# Patient Record
Sex: Male | Born: 1935 | Race: White | Hispanic: No | Marital: Married | Smoking: Never smoker
Health system: Southern US, Community
[De-identification: ages and names within clinical notes are randomized; demographics above are authoritative.]

## PROBLEM LIST (undated history)

## (undated) DIAGNOSIS — H409 Unspecified glaucoma: Secondary | ICD-10-CM

## (undated) DIAGNOSIS — F0390 Unspecified dementia without behavioral disturbance: Secondary | ICD-10-CM

## (undated) DIAGNOSIS — F32A Depression, unspecified: Secondary | ICD-10-CM

## (undated) DIAGNOSIS — N289 Disorder of kidney and ureter, unspecified: Secondary | ICD-10-CM

## (undated) HISTORY — DX: Unspecified glaucoma: H40.9

---

## 2015-09-15 DIAGNOSIS — H26491 Other secondary cataract, right eye: Secondary | ICD-10-CM | POA: Diagnosis not present

## 2015-09-15 DIAGNOSIS — H02403 Unspecified ptosis of bilateral eyelids: Secondary | ICD-10-CM | POA: Diagnosis not present

## 2015-09-15 DIAGNOSIS — Z961 Presence of intraocular lens: Secondary | ICD-10-CM | POA: Diagnosis not present

## 2015-09-15 DIAGNOSIS — H4010X2 Unspecified open-angle glaucoma, moderate stage: Secondary | ICD-10-CM | POA: Diagnosis not present

## 2015-12-18 ENCOUNTER — Encounter (HOSPITAL_COMMUNITY): Payer: Self-pay | Admitting: Emergency Medicine

## 2015-12-18 ENCOUNTER — Emergency Department (HOSPITAL_COMMUNITY)
Admission: EM | Admit: 2015-12-18 | Discharge: 2015-12-18 | Disposition: A | Discharge status: Home / Self Care | Payer: PPO | Attending: Emergency Medicine | Admitting: Emergency Medicine

## 2015-12-18 DIAGNOSIS — R32 Unspecified urinary incontinence: Secondary | ICD-10-CM | POA: Insufficient documentation

## 2015-12-18 DIAGNOSIS — Z87448 Personal history of other diseases of urinary system: Secondary | ICD-10-CM | POA: Diagnosis not present

## 2015-12-18 DIAGNOSIS — R319 Hematuria, unspecified: Secondary | ICD-10-CM | POA: Diagnosis not present

## 2015-12-18 DIAGNOSIS — R531 Weakness: Secondary | ICD-10-CM | POA: Diagnosis not present

## 2015-12-18 DIAGNOSIS — T887XXA Unspecified adverse effect of drug or medicament, initial encounter: Secondary | ICD-10-CM | POA: Diagnosis not present

## 2015-12-18 HISTORY — DX: Disorder of kidney and ureter, unspecified: N28.9

## 2015-12-18 LAB — COMPREHENSIVE METABOLIC PANEL
ALK PHOS: 64 U/L (ref 38–126)
ALT: 20 U/L (ref 17–63)
ANION GAP: 11 (ref 5–15)
AST: 19 U/L (ref 15–41)
Albumin: 4.5 g/dL (ref 3.5–5.0)
BUN: 19 mg/dL (ref 6–20)
CALCIUM: 9.2 mg/dL (ref 8.9–10.3)
CHLORIDE: 99 mmol/L — AB (ref 101–111)
CO2: 23 mmol/L (ref 22–32)
Creatinine, Ser: 1.13 mg/dL (ref 0.61–1.24)
GFR calc non Af Amer: 60 mL/min — ABNORMAL LOW (ref >60)
Glucose, Bld: 109 mg/dL — ABNORMAL HIGH (ref 65–99)
POTASSIUM: 4 mmol/L (ref 3.5–5.1)
SODIUM: 133 mmol/L — AB (ref 135–145)
Total Bilirubin: 1.5 mg/dL — ABNORMAL HIGH (ref 0.3–1.2)
Total Protein: 7.7 g/dL (ref 6.5–8.1)

## 2015-12-18 LAB — CBC WITH DIFFERENTIAL/PLATELET
Basophils Absolute: 0 10*3/uL (ref 0.0–0.1)
Basophils Relative: 0 %
EOS ABS: 0 10*3/uL (ref 0.0–0.7)
EOS PCT: 1 %
HCT: 46.2 % (ref 39.0–52.0)
Hemoglobin: 16 g/dL (ref 13.0–17.0)
LYMPHS ABS: 1.5 10*3/uL (ref 0.7–4.0)
Lymphocytes Relative: 21 %
MCH: 31.6 pg (ref 26.0–34.0)
MCHC: 34.6 g/dL (ref 30.0–36.0)
MCV: 91.3 fL (ref 78.0–100.0)
MONO ABS: 0.8 10*3/uL (ref 0.1–1.0)
MONOS PCT: 11 %
Neutro Abs: 4.8 10*3/uL (ref 1.7–7.7)
Neutrophils Relative %: 67 %
PLATELETS: 187 10*3/uL (ref 150–400)
RBC: 5.06 MIL/uL (ref 4.22–5.81)
RDW: 12.4 % (ref 11.5–15.5)
WBC: 7.2 10*3/uL (ref 4.0–10.5)

## 2015-12-18 LAB — URINALYSIS, ROUTINE W REFLEX MICROSCOPIC
BILIRUBIN URINE: NEGATIVE
Glucose, UA: NEGATIVE mg/dL
HGB URINE DIPSTICK: NEGATIVE
KETONES UR: 15 mg/dL — AB
Leukocytes, UA: NEGATIVE
Nitrite: NEGATIVE
PH: 5 (ref 5.0–8.0)
Protein, ur: NEGATIVE mg/dL
SPECIFIC GRAVITY, URINE: 1.02 (ref 1.005–1.030)

## 2015-12-18 MED ORDER — SODIUM CHLORIDE 0.9 % IV BOLUS (SEPSIS)
1000.0000 mL | Freq: Once | INTRAVENOUS | Status: AC
  Administered 2015-12-18: 1000 mL via INTRAVENOUS

## 2015-12-18 NOTE — ED Notes (Signed)
Per wife, hematuria for a couple of days

## 2015-12-18 NOTE — Progress Notes (Signed)
Patient listed as having  [employer] insurance without a pcp.  Patient's wife at bedside reports she has the same insurance and is seen by Dr. Marden Nobleobert Gates of [facility] and will cantact the office to see if patient will be accepted there as a patient.

## 2015-12-18 NOTE — Discharge Instructions (Signed)
Your bloodwork today was normal. Your urine showed a tiny bit of dehydration. You were given IV fluids in the emergency room today. Please call [facility] to schedule a follow up evaluation of your urinary issues. Return to the emergency room for worsening condition or new concerning symptoms. Follow up with your regular doctor as well. If you don't have a regular doctor use one of the numbers below to establish a primary care doctor.   Emergency Department Resource Guide 1) Find a Doctor and Pay Out of Pocket Although you won't have to find out who is covered by your insurance plan, it is a good idea to ask around and get recommendations. You will then need to call the office and see if the doctor you have chosen will accept you as a new patient and what types of options they offer for patients who are self-pay. Some doctors offer discounts or will set up payment plans for their patients who do not have insurance, but you will need to ask so you aren't surprised when you get to your appointment.  2) Contact Your Local Health Department Not all health departments have doctors that can see patients for sick visits, but many do, so it is worth a call to see if yours does. If you don't know where your local health department is, you can check in your phone book. The CDC also has a tool to help you locate your state's health department, and many state websites also have listings of all of their local health departments.  3) Find a Walk-in Clinic If your illness is not likely to be very severe or complicated, you may want to try a walk in clinic. These are popping up all over the country in pharmacies, drugstores, and shopping centers. They're usually staffed by nurse practitioners or physician assistants that have been trained to treat common illnesses and complaints. They're usually fairly quick and inexpensive. However, if you have serious medical issues or chronic medical problems, these are probably  not your best option.  No Primary Care Doctor: - Call Health Connect at  [phone] - they can help you locate a primary care doctor that  accepts your insurance, provides certain services, etc. - Physician Referral [phone]  Emergency Department Resource Guide 1) Find a Doctor and Pay Out of Pocket Although you won't have to find out who is covered by your insurance plan, it is a good idea to ask around and get recommendations. You will then need to call the office and see if the doctor you have chosen will accept you as a new patient and what types of options they offer for patients who are self-pay. Some doctors offer discounts or will set up payment plans for their patients who do not have insurance, but you will need to ask so you aren't surprised when you get to your appointment.  2) Contact Your Local Health Department Not all health departments have doctors that can see patients for sick visits, but many do, so it is worth a call to see if yours does. If you don't know where your local health department is, you can check in your phone book. The CDC also has a tool to help you locate your state's health department, and many state websites also have listings of all of their local health departments.  3) Find a Walk-in Clinic If your illness is not likely to be very severe or complicated, you may want to try a walk in clinic. These  are popping up all over the country in pharmacies, drugstores, and shopping centers. They're usually staffed by nurse practitioners or physician assistants that have been trained to treat common illnesses and complaints. They're usually fairly quick and inexpensive. However, if you have serious medical issues or chronic medical problems, these are probably not your best option.  No Primary Care Doctor: - Call Health Connect at  [phone] - they can help you locate a primary care doctor that  accepts your insurance, provides certain services,  etc. - Physician Referral Service- [phone]  Chronic Pain Problems: Organization         Address  Phone   Notes  [facility]  [phone] Patients need to be referred by their primary care doctor.   Medication Assistance: Organization         Address  Phone   Notes  [facility] Medication [facility] [address] [place], [place] [address] [phone] --Must be a resident of [facility] -- Must have NO insurance coverage whatsoever (no Medicaid/ Medicare, etc.) -- The pt. MUST have a primary care doctor that directs their care regularly and follows them in the community   MedAssist  [phone]   [employer]  [phone]    Agencies that provide inexpensive medical care: Organization         Address  Phone   Notes  [facility]  [phone]   [facility]    [phone]   [facility] [address] [place], [place] [address] [phone]   [facility] [address], [place] [phone]   [facility]    [phone]) [phone]   [facility]    [phone]   [facility]  [address], [place] Phone:  [phone], Fax:  [phone] Hours of Operation:  9 am - 6 pm, M-F.  Also accepts Medicaid/Medicare and self-pay.  [facility]  [address], [place] Phone: [phone], Fax: [phone]. Hours of Operation:  8:30 am - 5:30 pm, M-F.  Also accepts Medicaid and self-pay.  [facility] [place] [address], [place] Phone: [phone]) [phone]   [facility] [address], [place], [place] [phone], Ext. [phone] Mondays & Thursdays: 7-9 AM.  First 15 patients are seen on a first come, first serve basis.    Medicaid-accepting [facility] Providers:  Organization         Address  Phone   Notes  [facility] [address], [place] [phone] Also accepts self-pay patients.  [facility] [address], [place]  [phone]) [phone]   [facility] [address], [place] [phone]   [facility] [address] [place] [address], [place] [phone]) [phone]   [facility]akers[facility] [place] [address], [place]   [phone] Only accepts [place] patients after they have their name applied to their card.   Self-Pay (no insurance) in [place]:  Organization         Address  Phone   Notes  Sickle Cell Patients, [facility] [address], [place] [phone]) [phone]   [facility] [address], [place] [phone]   [facility] [place]  [place], [address],  [phone]   [facility]/Dr. Osei-Bonsu  [address] [place] or [address], [place] [phone] Phone number for both [place] and [place] locations is the same.  [facility] [address], [place] [phone]   [facility] [address], [place] or [address] [phone]   [facility] [address] [place] [phone], phone; [phone], fax Sees patients 1st and 3rd Saturday of every month.  Must not qualify for public or private insurance (i.e. Medicaid, Medicare, [place] Choice, Veterans' Benefits)  Household income should be no more than 200% of the poverty level The clinic cannot treat you if you are pregnant or think you are pregnant  Sexually transmitted diseases are not treated at the clinic.

## 2015-12-18 NOTE — ED Provider Notes (Signed)
CSN: [id number]     Arrival date & time 12/18/15  1629 History   First MD Initiated Contact with Patient 12/18/15 1721     Chief Complaint  Patient presents with  . Hematuria    HPI   Chad Holden is an 82 y.o. male with no significant PMH who presents to the ED for evaluation of urinary incontinence and possible hematuria. The history is provided by the patient and his wife. Pt reports the had a wisdom tooth extracted about one week ago. He states that he accidentally had been taking the prescribed norco 2 pills every 4 hours instead of as needed. Last Saturday his wife reports that he was acting very "out of it" and so she called EMS who instructed them on proper medication dosing. He states he finished the prescribed norco and amoxicillin earlier this week. He states that for the past couple of days he has noticed some urinary incontinence, particularly when up walking around. He and his wife note that his urine has also had a red tinge to it that they think looks like blood. He had OMFS f/u today and the oral surgeon told him the surgical site was healing well. Pt states he was also seen at [facility] earlier today and CBC showed some hemoconcentration, and they were told to come to the ED for further evaluation and possible IV fluids for dehydration. At this time pt has no complaints. Denies abdominal pain, n/v/d, headache, confusion, chest pain, SOB. Denies dysuria. His wife states that she thinks pt seems "slower than usual" but otherwise not acting weirdly. Pt denies smoking, EtOH, or other drug use. He does not know if he has any medical issues because he "never goes to the doctor."  Past Medical History  Diagnosis Date  . Renal disorder    History reviewed. No pertinent past surgical history. No family history on file. Social History  Substance Use Topics  . Smoking status: Never Smoker   . Smokeless tobacco: None  . Alcohol Use: No    Review of Systems  All other systems reviewed and  are negative.     Allergies  Review of patient's allergies indicates not on file.  Home Medications   Prior to Admission medications   Not on File   BP 138/93 mmHg  Pulse 87  Temp(Src) 97.8 F (36.6 C) (Oral)  Resp 18  SpO2 95% Physical Exam  Constitutional: He is oriented to person, place, and time.  HENT:  Right Ear: External ear normal.  Left Ear: External ear normal.  Nose: Nose normal.  Mouth/Throat: Oropharynx is clear and moist. No oropharyngeal exudate.  Eyes: Conjunctivae and EOM are normal. Pupils are equal, round, and reactive to light.  Neck: Normal range of motion. Neck supple.  Cardiovascular: Normal rate, regular rhythm, normal heart sounds and intact distal pulses.   Pulmonary/Chest: Effort normal and breath sounds normal. No respiratory distress. He has no wheezes. He exhibits no tenderness.  Abdominal: Soft. Bowel sounds are normal. He exhibits no distension. There is no tenderness. There is no rebound and no guarding.  Genitourinary:  Declined DRE  Musculoskeletal: He exhibits no edema.  Neurological: He is alert and oriented to person, place, and time. No cranial nerve deficit. Coordination normal.  Normal finger to nose. No pronator drift. Steady but slow gait.  Skin: Skin is warm and dry.  Psychiatric: He has a normal mood and affect.  Nursing note and vitals reviewed.   ED Course  Procedures (including critical  care time) Labs Review Labs Reviewed  URINALYSIS, ROUTINE W REFLEX MICROSCOPIC (NOT AT [facility]) - Abnormal; Notable for the following:    Ketones, ur 15 (*)    All other components within normal limits  COMPREHENSIVE METABOLIC PANEL - Abnormal; Notable for the following:    Sodium 133 (*)    Chloride 99 (*)    Glucose, Bld 109 (*)    Total Bilirubin 1.5 (*)    GFR calc non Af Amer 60 (*)    All other components within normal limits  CBC WITH DIFFERENTIAL/PLATELET    Imaging Review No results found. I have personally reviewed and  evaluated these images and lab results as part of my medical decision-making.   EKG Interpretation None      MDM   Final diagnoses:  Urinary incontinence, unspecified incontinence type    Bloodwork with mild hyponatremia of 133. UA with 15 ketones. 1L NS bolus given in the ED. Pt reports feeling improved. VSS and he is nontoxic appearing. No indication for further workup at this time. Instructed to f/u with urology regarding urinary issues. Resource guide given to establish PCP. ER return precautions given.    Carlene CoriaSerena Y Fabion Gatson, PA-C 12/18/15 1954  Alvira MondayErin Schlossman, MD 12/19/15 1355

## 2015-12-24 ENCOUNTER — Inpatient Hospital Stay (HOSPITAL_COMMUNITY)
Admission: EM | Admit: 2015-12-24 | Discharge: 2015-12-27 | DRG: 689 | Disposition: A | Discharge status: Home Health | Payer: PPO | Attending: Internal Medicine | Admitting: Internal Medicine

## 2015-12-24 ENCOUNTER — Inpatient Hospital Stay (HOSPITAL_COMMUNITY): Payer: PPO

## 2015-12-24 ENCOUNTER — Encounter (HOSPITAL_COMMUNITY): Payer: Self-pay | Admitting: Emergency Medicine

## 2015-12-24 ENCOUNTER — Emergency Department (HOSPITAL_COMMUNITY): Payer: PPO

## 2015-12-24 DIAGNOSIS — G9340 Encephalopathy, unspecified: Secondary | ICD-10-CM

## 2015-12-24 DIAGNOSIS — N281 Cyst of kidney, acquired: Secondary | ICD-10-CM | POA: Diagnosis not present

## 2015-12-24 DIAGNOSIS — N3289 Other specified disorders of bladder: Secondary | ICD-10-CM

## 2015-12-24 DIAGNOSIS — E871 Hypo-osmolality and hyponatremia: Secondary | ICD-10-CM | POA: Diagnosis present

## 2015-12-24 DIAGNOSIS — N329 Bladder disorder, unspecified: Secondary | ICD-10-CM

## 2015-12-24 DIAGNOSIS — Z79899 Other long term (current) drug therapy: Secondary | ICD-10-CM | POA: Diagnosis not present

## 2015-12-24 DIAGNOSIS — K4090 Unilateral inguinal hernia, without obstruction or gangrene, not specified as recurrent: Secondary | ICD-10-CM | POA: Diagnosis present

## 2015-12-24 DIAGNOSIS — N390 Urinary tract infection, site not specified: Secondary | ICD-10-CM | POA: Diagnosis not present

## 2015-12-24 DIAGNOSIS — Z8249 Family history of ischemic heart disease and other diseases of the circulatory system: Secondary | ICD-10-CM | POA: Diagnosis not present

## 2015-12-24 DIAGNOSIS — R410 Disorientation, unspecified: Secondary | ICD-10-CM | POA: Diagnosis present

## 2015-12-24 DIAGNOSIS — R339 Retention of urine, unspecified: Secondary | ICD-10-CM

## 2015-12-24 DIAGNOSIS — B961 Klebsiella pneumoniae [K. pneumoniae] as the cause of diseases classified elsewhere: Secondary | ICD-10-CM | POA: Diagnosis not present

## 2015-12-24 DIAGNOSIS — R319 Hematuria, unspecified: Secondary | ICD-10-CM | POA: Diagnosis present

## 2015-12-24 DIAGNOSIS — H409 Unspecified glaucoma: Secondary | ICD-10-CM | POA: Diagnosis not present

## 2015-12-24 DIAGNOSIS — Z808 Family history of malignant neoplasm of other organs or systems: Secondary | ICD-10-CM | POA: Diagnosis not present

## 2015-12-24 DIAGNOSIS — D72829 Elevated white blood cell count, unspecified: Secondary | ICD-10-CM | POA: Diagnosis not present

## 2015-12-24 LAB — COMPREHENSIVE METABOLIC PANEL
ALBUMIN: 4 g/dL (ref 3.5–5.0)
ALK PHOS: 65 U/L (ref 38–126)
ALT: 18 U/L (ref 17–63)
AST: 15 U/L (ref 15–41)
Anion gap: 10 (ref 5–15)
BUN: 14 mg/dL (ref 6–20)
CHLORIDE: 99 mmol/L — AB (ref 101–111)
CO2: 25 mmol/L (ref 22–32)
CREATININE: 1.08 mg/dL (ref 0.61–1.24)
Calcium: 9.1 mg/dL (ref 8.9–10.3)
GFR calc Af Amer: >60 mL/min (ref >60)
GFR calc non Af Amer: >60 mL/min (ref >60)
Glucose, Bld: 147 mg/dL — ABNORMAL HIGH (ref 65–99)
POTASSIUM: 4.2 mmol/L (ref 3.5–5.1)
SODIUM: 134 mmol/L — AB (ref 135–145)
TOTAL PROTEIN: 7.3 g/dL (ref 6.5–8.1)
Total Bilirubin: 1.4 mg/dL — ABNORMAL HIGH (ref 0.3–1.2)

## 2015-12-24 LAB — CBC WITH DIFFERENTIAL/PLATELET
BASOS ABS: 0 10*3/uL (ref 0.0–0.1)
Basophils Relative: 0 %
EOS ABS: 0 10*3/uL (ref 0.0–0.7)
EOS PCT: 0 %
HCT: 44.2 % (ref 39.0–52.0)
HEMOGLOBIN: 15.4 g/dL (ref 13.0–17.0)
LYMPHS ABS: 0.7 10*3/uL (ref 0.7–4.0)
LYMPHS PCT: 4 %
MCH: 32.5 pg (ref 26.0–34.0)
MCHC: 34.8 g/dL (ref 30.0–36.0)
MCV: 93.2 fL (ref 78.0–100.0)
Monocytes Absolute: 1.5 10*3/uL — ABNORMAL HIGH (ref 0.1–1.0)
Monocytes Relative: 9 %
NEUTROS PCT: 87 %
Neutro Abs: 15.2 10*3/uL — ABNORMAL HIGH (ref 1.7–7.7)
PLATELETS: 189 10*3/uL (ref 150–400)
RBC: 4.74 MIL/uL (ref 4.22–5.81)
RDW: 12.7 % (ref 11.5–15.5)
WBC: 17.5 10*3/uL — AB (ref 4.0–10.5)

## 2015-12-24 LAB — URINALYSIS, ROUTINE W REFLEX MICROSCOPIC
BILIRUBIN URINE: NEGATIVE
GLUCOSE, UA: NEGATIVE mg/dL
Ketones, ur: NEGATIVE mg/dL
NITRITE: NEGATIVE
PH: 6 (ref 5.0–8.0)
Protein, ur: 100 mg/dL — AB
SPECIFIC GRAVITY, URINE: 1.023 (ref 1.005–1.030)

## 2015-12-24 LAB — URINE MICROSCOPIC-ADD ON

## 2015-12-24 LAB — I-STAT CG4 LACTIC ACID, ED: LACTIC ACID, VENOUS: 1.59 mmol/L (ref 0.5–2.0)

## 2015-12-24 MED ORDER — IOPAMIDOL (ISOVUE-300) INJECTION 61%
100.0000 mL | Freq: Once | INTRAVENOUS | Status: AC | PRN
  Administered 2015-12-24: 100 mL via INTRAVENOUS

## 2015-12-24 MED ORDER — ENOXAPARIN SODIUM 40 MG/0.4ML ~~LOC~~ SOLN
40.0000 mg | Freq: Every day | SUBCUTANEOUS | Status: DC
  Administered 2015-12-24 – 2015-12-26 (×3): 40 mg via SUBCUTANEOUS
  Filled 2015-12-24 (×3): qty 0.4

## 2015-12-24 MED ORDER — SODIUM CHLORIDE 0.9 % IV BOLUS (SEPSIS)
1000.0000 mL | Freq: Once | INTRAVENOUS | Status: AC
  Administered 2015-12-24: 1000 mL via INTRAVENOUS

## 2015-12-24 MED ORDER — DORZOLAMIDE HCL-TIMOLOL MAL 2-0.5 % OP SOLN
1.0000 [drp] | Freq: Two times a day (BID) | OPHTHALMIC | Status: DC
  Administered 2015-12-24 – 2015-12-27 (×6): 1 [drp] via OPHTHALMIC
  Filled 2015-12-24: qty 10

## 2015-12-24 MED ORDER — SODIUM CHLORIDE 0.9 % IV SOLN
INTRAVENOUS | Status: DC
  Administered 2015-12-25 – 2015-12-26 (×3): via INTRAVENOUS

## 2015-12-24 MED ORDER — DOCUSATE SODIUM 100 MG PO CAPS
100.0000 mg | ORAL_CAPSULE | Freq: Two times a day (BID) | ORAL | Status: DC
  Administered 2015-12-25 – 2015-12-27 (×5): 100 mg via ORAL
  Filled 2015-12-24 (×6): qty 1

## 2015-12-24 MED ORDER — ACETAMINOPHEN 325 MG PO TABS
650.0000 mg | ORAL_TABLET | Freq: Four times a day (QID) | ORAL | Status: DC | PRN
  Administered 2015-12-27: 650 mg via ORAL
  Filled 2015-12-24: qty 2

## 2015-12-24 MED ORDER — TAMSULOSIN HCL 0.4 MG PO CAPS
0.4000 mg | ORAL_CAPSULE | Freq: Once | ORAL | Status: AC
  Administered 2015-12-24: 0.4 mg via ORAL
  Filled 2015-12-24: qty 1

## 2015-12-24 MED ORDER — ACETAMINOPHEN 650 MG RE SUPP: 650.0000 mg | Freq: Four times a day (QID) | RECTAL | Status: DC | PRN

## 2015-12-24 MED ORDER — SODIUM CHLORIDE 0.9% FLUSH
3.0000 mL | Freq: Two times a day (BID) | INTRAVENOUS | Status: DC
  Administered 2015-12-24 – 2015-12-25 (×3): 3 mL via INTRAVENOUS

## 2015-12-24 MED ORDER — PHENAZOPYRIDINE HCL 100 MG PO TABS
100.0000 mg | ORAL_TABLET | Freq: Three times a day (TID) | ORAL | Status: DC | PRN
  Administered 2015-12-24 – 2015-12-27 (×2): 100 mg via ORAL
  Filled 2015-12-24 (×5): qty 1

## 2015-12-24 MED ORDER — LATANOPROST 0.005 % OP SOLN
1.0000 [drp] | Freq: Every day | OPHTHALMIC | Status: DC
  Administered 2015-12-24 – 2015-12-26 (×3): 1 [drp] via OPHTHALMIC
  Filled 2015-12-24: qty 2.5

## 2015-12-24 MED ORDER — DEXTROSE 5 % IV SOLN
1.0000 g | INTRAVENOUS | Status: DC
  Administered 2015-12-25 – 2015-12-27 (×3): 1 g via INTRAVENOUS
  Filled 2015-12-24 (×3): qty 10

## 2015-12-24 MED ORDER — TAMSULOSIN HCL 0.4 MG PO CAPS
0.4000 mg | ORAL_CAPSULE | Freq: Every day | ORAL | Status: DC
  Administered 2015-12-25 – 2015-12-27 (×3): 0.4 mg via ORAL
  Filled 2015-12-24 (×3): qty 1

## 2015-12-24 MED ORDER — DEXTROSE 5 % IV SOLN
1.0000 g | Freq: Once | INTRAVENOUS | Status: AC
  Administered 2015-12-24: 1 g via INTRAVENOUS
  Filled 2015-12-24: qty 10

## 2015-12-24 MED ORDER — SODIUM CHLORIDE 0.9 % IV SOLN
INTRAVENOUS | Status: DC
  Administered 2015-12-24 – 2015-12-25 (×2): via INTRAVENOUS

## 2015-12-24 NOTE — ED Notes (Signed)
MD at bedside. 

## 2015-12-24 NOTE — ED Notes (Signed)
Has been unable to urinate since yesterday. Hx of urinary problems, will not answer when asked multiple times about prostate problems.

## 2015-12-24 NOTE — ED Notes (Signed)
Family at bedside. 

## 2015-12-24 NOTE — ED Notes (Signed)
Drinking oral contrast 

## 2015-12-24 NOTE — ED Notes (Signed)
Patient bladder scanned with a total of 32 cc of urine located.

## 2015-12-24 NOTE — ED Notes (Signed)
MD at bedside. EDP PRESENT SPEAKING WITH WIFE AND PT. AWARE OF CONCERNS ABOUT FOLEY. CT SCAN REVIEWED. FOLEY IS IN PLACE. tHIS DISCUSSED WITH FAMILY. PT CLEANED AND BED CHANGED. PAIN MEDS DISCUSSED

## 2015-12-24 NOTE — ED Provider Notes (Signed)
CSN: [id number]     Arrival date & time 12/24/15  1253 History   First MD Initiated Contact with Patient 12/24/15 1330     Chief Complaint  Patient presents with  . Urinary Retention     The history is provided by the patient and the spouse. No language interpreter was used.   Overton MamBobby W Kempton is a 82 y.o. male who presents to the Emergency Department complaining of difficulty urinating.  History is provided by the patient and his wife. His wife reports that he's had difficulty urinating since about 7:30 last night. He woke up multiple times throughout the night and attempted to urinate and was only able to pass small amounts of urine at any given time. No reports of fever, vomiting, bowel pain, back pain. She states that he is not been acting like himself and has been less talkative and generally weak lately. He has had intermittent issues with this since having a dental extraction on March 30. He was prescribed hydrocodone at that time but is no longer taking this medication. He does not see a doctor and has no known medical issues other than glaucoma.  Past Medical History  Diagnosis Date  . Renal disorder    History reviewed. No pertinent past surgical history. History reviewed. No pertinent family history. Social History  Substance Use Topics  . Smoking status: Never Smoker   . Smokeless tobacco: None  . Alcohol Use: No    Review of Systems  All other systems reviewed and are negative.     Allergies  Review of patient's allergies indicates no known allergies.  Home Medications   Prior to Admission medications   Medication Sig Start Date End Date Taking? Authorizing Provider  dorzolamide-timolol (COSOPT) 22.3-6.8 MG/ML ophthalmic solution PLACE 1 DROP IN [facility] EVERY 12 HOURS 10/23/15  Yes Historical Provider, MD  ibuprofen (ADVIL,MOTRIN) 200 MG tablet Take 200 mg by mouth every 6 (six) hours as needed for moderate pain.   Yes Historical Provider, MD  latanoprost (XALATAN)  0.005 % ophthalmic solution Place 1 drop into both eyes at bedtime.  12/18/15  Yes Historical Provider, MD   BP 130/85 mmHg  Pulse 84  Temp(Src) 99.3 F (37.4 C) (Oral)  Resp 17  SpO2 96% Physical Exam  Constitutional: He appears well-developed and well-nourished.  HENT:  Head: Normocephalic and atraumatic.  Cardiovascular: Normal rate and regular rhythm.   No murmur heard. Pulmonary/Chest: Effort normal and breath sounds normal. No respiratory distress.  Abdominal: Soft. There is no rebound and no guarding.  Mild lower abdominal tenderness  Musculoskeletal: He exhibits no edema or tenderness.  Neurological: He is alert.  Mildly confused and slow to answer questions. He is disoriented to time but is aware of the current president.  Skin: Skin is warm and dry.  Psychiatric: He has a normal mood and affect. His behavior is normal.  Nursing note and vitals reviewed.   ED Course  Procedures (including critical care time) Labs Review Labs Reviewed  COMPREHENSIVE METABOLIC PANEL - Abnormal; Notable for the following:    Sodium 134 (*)    Chloride 99 (*)    Glucose, Bld 147 (*)    Total Bilirubin 1.4 (*)    All other components within normal limits  CBC WITH DIFFERENTIAL/PLATELET - Abnormal; Notable for the following:    WBC 17.5 (*)    Neutro Abs 15.2 (*)    Monocytes Absolute 1.5 (*)    All other components within normal limits  URINE  CULTURE  CULTURE, BLOOD (ROUTINE X 2)  CULTURE, BLOOD (ROUTINE X 2)  URINALYSIS, ROUTINE W REFLEX MICROSCOPIC (NOT AT [facility])  I-STAT CG4 LACTIC ACID, ED    Imaging Review No results found. I have personally reviewed and evaluated these images and lab results as part of my medical decision-making.   EKG Interpretation None      MDM   Final diagnoses:  None    Patient here for evaluation of confusion, decreased urine output since yesterday. He has mild lower abdominal tenderness on examination, no significant urinary retention. Given  his confusion, leukocytosis and treat for potential urinary tract infection pending his UA results. Will check CT abdomen to rule out appendicitis given his mild lower abdominal tenderness. Patient care transferred  pending UA, CT scan.  Tilden Fossa, MD 12/24/15 1620

## 2015-12-24 NOTE — ED Notes (Signed)
Notified MD of patient confusion, slightly elevated temp, and inability to void. MD stated to get a rectal temp and not to cath until he has been medically evaluated to determine need for foley. Notified primary nurse

## 2015-12-24 NOTE — ED Notes (Signed)
Patient transported to CT 

## 2015-12-24 NOTE — ED Notes (Signed)
Bed: [id number] Expected date:  Expected time:  Means of arrival:  Comments: TR 2 when clean

## 2015-12-24 NOTE — H&P (Signed)
[facility] History and Physical  Chad MamBobby W Swor NWG:[id number] DOB: 02/15/1934 DOA: 12/24/2015  Referring physician: Dr Amalia GreenhousePhyfer PCP: No primary care provider on file.   Chief Complaint: Confusion   HPI: Chad Holden is a 82 y.o. male with no significant PMH,he doesn't like to follow  with Doctor per wife report. Patient April 1 had tooth extraction. He was prescribe antibiotics and opioids. Since that time he has been confuse, less interactive. He presents today to the ED with difficulty urinating, pain, and worsening confusion. He   He denies chest pain, dyspnea,nausea or vomiting. He had BM yesterday. He has been having, bladder spasm since foley was place.   Evaluation in the ED; UA with too numerous WBC, WBC at 17. CT abdomen: 1. No obstructive uropathy or urologic abnormality. The bladder is completely decompressed by Foley catheter at this time. Small bowel and mesentery containing but non incarcerated appearing right inguinal hernia tracking into the scrotum.   Review of Systems:  Negative, except as per HPI   Past Medical History  Diagnosis Date  . Renal disorder    History reviewed. No pertinent past surgical history. Social History:  reports that he has never smoked. He does not have any smokeless tobacco history on file. He reports that he does not drink alcohol. His drug history is not on file.  No Known Allergies  Family History:  Mother died CHF, Father throat cancer.    Prior to Admission medications   Medication Sig Start Date End Date Taking? Authorizing Provider  dorzolamide-timolol (COSOPT) 22.3-6.8 MG/ML ophthalmic solution PLACE [facility] 10/23/15  Yes Historical Provider, MD  ibuprofen (ADVIL,MOTRIN) 200 MG tablet Take 200 mg by mouth every 6 (six) hours as needed for moderate pain.   Yes Historical Provider, MD  latanoprost (XALATAN) 0.005 % ophthalmic solution Place 1 drop into both eyes at bedtime.  12/18/15  Yes Historical  Provider, MD   Physical Exam: Filed Vitals:   12/24/15 1257 12/24/15 1530 12/24/15 1600 12/24/15 1754  BP: 124/75 130/69 130/85 135/82  Pulse: 100 84  83  Temp: 99.3 F (37.4 C)   98.1 F (36.7 C)  TempSrc: Oral   Oral  Resp: 19 21 17 20   SpO2: 97% 96%  99%    Wt Readings from Last 3 Encounters:  No data found for Wt    General:  Appears calm and comfortable Eyes: PERRL, normal lids, irises & conjunctiva ENT: grossly normal hearing, lips & tongue Neck: no LAD, masses or thyromegaly Cardiovascular: RRR, no m/r/g. No LE edema. Telemetry: SR, no arrhythmias  Respiratory: CTA bilaterally, no w/r/r. Normal respiratory effort. Abdomen: soft, ntnd Skin: no rash or induration seen on limited exam Musculoskeletal: grossly normal tone BUE/BLE Neurologic: grossly non-focal. Slowly answering questions. Follow commands. Indifferent           Labs on Admission:  Basic Metabolic Panel:  Recent Labs Lab 12/18/15 1819 12/24/15 1418  NA 133* 134*  K 4.0 4.2  CL 99* 99*  CO2 23 25  GLUCOSE 109* 147*  BUN 19 14  CREATININE 1.13 1.08  CALCIUM 9.2 9.1   Liver Function Tests:  Recent Labs Lab 12/18/15 1819 12/24/15 1418  AST 19 15  ALT 20 18  ALKPHOS 64 65  BILITOT 1.5* 1.4*  PROT 7.7 7.3  ALBUMIN 4.5 4.0   No results for input(s): LIPASE, AMYLASE in the last 168 hours. No results for input(s): AMMONIA in the last 168 hours. CBC:  Recent Labs Lab 12/18/15 1819 12/24/15 1418  WBC 7.2 17.5*  NEUTROABS 4.8 15.2*  HGB 16.0 15.4  HCT 46.2 44.2  MCV 91.3 93.2  PLT 187 189   Cardiac Enzymes: No results for input(s): CKTOTAL, CKMB, CKMBINDEX, TROPONINI in the last 168 hours.  BNP (last 3 results) No results for input(s): BNP in the last 8760 hours.  ProBNP (last 3 results) No results for input(s): PROBNP in the last 8760 hours.  CBG: No results for input(s): GLUCAP in the last 168 hours.  Radiological Exams on Admission: Ct Abdomen Pelvis W  Contrast  12/24/2015  CLINICAL DATA:  82 year old male with acute difficulty urinating. Confusion. Initial encounter. EXAM: CT ABDOMEN AND PELVIS WITH CONTRAST TECHNIQUE: Multidetector CT imaging of the abdomen and pelvis was performed using the standard protocol following bolus administration of intravenous contrast. CONTRAST:  ISOVUE-300 IOPAMIDOL (ISOVUE-300) INJECTION 61% COMPARISON:  None. FINDINGS: Mild respiratory motion artifact at the lung bases. Mild atelectasis, otherwise negative. No pericardial or pleural effusion. Multilevel degenerative lumbar spondylolisthesis with widespread facet degeneration. No acute osseous abnormality identified. No pelvic free fluid. Foley catheter in the urinary bladder which is completely decompressed. Dystrophic prostate calcifications. Negative rectum. Negative sigmoid colon. Decompressed left colon. Decompressed transverse colon. Negative right colon and appendix. Negative terminal ileum. No dilated small bowel. There is a small bowel and small bowel mesenteric containing right inguinal hernia which tracks into the scrotum. Associated loops appear normal. No abnormal small bowel loops elsewhere. Decompressed stomach and duodenum. No abdominal free air or free fluid. Negative liver with occasional tiny low-density areas most likely benign cysts. Gallbladder, spleen, pancreas and adrenal glands are normal. Portal venous system is patent. Aortoiliac calcified atherosclerosis noted. Major arterial structures are patent. Bilateral iliac arteries are ectatic. No lymphadenopathy. Bilateral renal enhancement and contrast excretion is symmetric and normal. There is a benign left renal parapelvic cyst. No hydronephrosis or definite pathologic perinephric stranding. No nephrolithiasis identified. Negative left ureter. Negative right ureter. Incidental pelvic phleboliths. IMPRESSION: 1. No obstructive uropathy or urologic abnormality. The bladder is completely decompressed by  Foley catheter at this time. 2. Small bowel and mesentery containing but non incarcerated appearing right inguinal hernia tracking into the scrotum. 3. Calcified aortic atherosclerosis. Electronically Signed   By: Odessa Fleming M.D.   On: 12/24/2015 16:54    EKG: Will order EKG  Assessment/Plan Active Problems:   UTI (lower urinary tract infection)   Acute encephalopathy   Leukocytosis  1-UTI; Patient presents with difficulty with urination, UA with too numerous to count WBC.  Continue with Ceftriaxone.  Foley place in the ED.  Need voiding trial.  Start flomax.  Lactic acid normal.   2-Leukocytosis; secondary to UTI.  Follow trend.  IV antibiotics.   3-Encephalopathy;  Patient has been less interactive, talkative. " in a Daze" per wife report. Everything started after he was taking opioid for tooth pain April first. He has not been taking pain medications anymore, but he is still not back to baseline.  It might be related to UTI also, but will rule out stroke.   4-Mild hyponatremia;  Continue with IV fluids.   5-urine retension; foley in place. Started flomax. Need voiding trial when infection improved.   Code Status: partial code DVT Prophylaxis: lovenox Family Communication: Care discussed with wife who was at bedside. Disposition Plan:admit for tx UTI  Time spent: 75 minutes.   Hartley Barefoot A [facility] Pager [phone]

## 2015-12-25 DIAGNOSIS — G9340 Encephalopathy, unspecified: Secondary | ICD-10-CM

## 2015-12-25 DIAGNOSIS — N390 Urinary tract infection, site not specified: Secondary | ICD-10-CM | POA: Diagnosis not present

## 2015-12-25 DIAGNOSIS — D72829 Elevated white blood cell count, unspecified: Secondary | ICD-10-CM | POA: Diagnosis not present

## 2015-12-25 DIAGNOSIS — R339 Retention of urine, unspecified: Secondary | ICD-10-CM

## 2015-12-25 DIAGNOSIS — R410 Disorientation, unspecified: Secondary | ICD-10-CM | POA: Diagnosis not present

## 2015-12-25 DIAGNOSIS — B961 Klebsiella pneumoniae [K. pneumoniae] as the cause of diseases classified elsewhere: Secondary | ICD-10-CM | POA: Diagnosis not present

## 2015-12-25 DIAGNOSIS — R319 Hematuria, unspecified: Secondary | ICD-10-CM | POA: Diagnosis not present

## 2015-12-25 LAB — COMPREHENSIVE METABOLIC PANEL
ALBUMIN: 3.2 g/dL — AB (ref 3.5–5.0)
ALK PHOS: 48 U/L (ref 38–126)
ALT: 15 U/L — ABNORMAL LOW (ref 17–63)
ANION GAP: 8 (ref 5–15)
AST: 13 U/L — AB (ref 15–41)
BUN: 12 mg/dL (ref 6–20)
CO2: 22 mmol/L (ref 22–32)
Calcium: 8.3 mg/dL — ABNORMAL LOW (ref 8.9–10.3)
Chloride: 106 mmol/L (ref 101–111)
Creatinine, Ser: 0.85 mg/dL (ref 0.61–1.24)
GFR calc Af Amer: >60 mL/min (ref >60)
GFR calc non Af Amer: >60 mL/min (ref >60)
GLUCOSE: 134 mg/dL — AB (ref 65–99)
POTASSIUM: 3.8 mmol/L (ref 3.5–5.1)
SODIUM: 136 mmol/L (ref 135–145)
Total Bilirubin: 0.9 mg/dL (ref 0.3–1.2)
Total Protein: 5.7 g/dL — ABNORMAL LOW (ref 6.5–8.1)

## 2015-12-25 LAB — CBC
HEMATOCRIT: 37.1 % — AB (ref 39.0–52.0)
HEMOGLOBIN: 12.7 g/dL — AB (ref 13.0–17.0)
MCH: 31.9 pg (ref 26.0–34.0)
MCHC: 34.2 g/dL (ref 30.0–36.0)
MCV: 93.2 fL (ref 78.0–100.0)
Platelets: 155 10*3/uL (ref 150–400)
RBC: 3.98 MIL/uL — ABNORMAL LOW (ref 4.22–5.81)
RDW: 12.7 % (ref 11.5–15.5)
WBC: 12.3 10*3/uL — AB (ref 4.0–10.5)

## 2015-12-25 NOTE — Evaluation (Signed)
Physical Therapy Evaluation Patient Details Name: Chad Holden MRN: [id number] DOB: 1934/01/18 Today's Date: 12/25/2015   History of Present Illness  Pt is a 82 year old male admitted with urinary tract infection with hematuria and urinary retention as well as acute encephalopathy  Clinical Impression  Pt admitted with above diagnosis. Pt currently with functional limitations due to the deficits listed below (see PT Problem List).  Pt will benefit from skilled PT to increase their independence and safety with mobility to allow discharge to the venue listed below.   Pt still a little confused today and requiring repeated verbal cues for tasks however able to ambulate in hallway.     Follow Up Recommendations Home health PT;Supervision/Assistance - 24 hour    Equipment Recommendations  Rolling walker with 5" wheels    Recommendations for Other Services       Precautions / Restrictions Precautions Precautions: Fall      Mobility  Bed Mobility Overal bed mobility: Needs Assistance Bed Mobility: Supine to Sit     Supine to sit: Min assist;HOB elevated     General bed mobility comments: increased time and effort, slight assist for trunk  Transfers Overall transfer level: Needs assistance Equipment used: None Transfers: Sit to/from Stand Sit to Stand: Min assist         General transfer comment: assist for rise and steadying  Ambulation/Gait Ambulation/Gait assistance: Min assist Ambulation Distance (Feet): 140 Feet Assistive device: 1 person hand held assist Gait Pattern/deviations: Step-through pattern;Decreased stride length     General Gait Details: requiring UE support so provided HHA, may benefit from RW if needed next visit however spouse reports he typically does not use anything at home  Stairs            Wheelchair Mobility    Modified Rankin (Stroke Patients Only)       Balance Overall balance assessment: Needs assistance          Standing balance support: Single extremity supported;During functional activity Standing balance-Leahy Scale: Poor Standing balance comment: requiring HHA for ambulation today                             Pertinent Vitals/Pain Pain Assessment: No/denies pain    Home Living Family/patient expects to be discharged to:: Private residence Living Arrangements: Spouse/significant other     Home Access: Stairs to enter   Secretary/administrator of Steps: 1 Home Layout: One level Home Equipment: None      Prior Function Level of Independence: Independent               Hand Dominance        Extremity/Trunk Assessment   Upper Extremity Assessment: Generalized weakness           Lower Extremity Assessment: Generalized weakness         Communication   Communication: No difficulties  Cognition Arousal/Alertness: Awake/alert Behavior During Therapy: Flat affect Overall Cognitive Status: Impaired/Different from baseline Area of Impairment: Orientation Orientation Level: Disoriented to;Place             General Comments: slow to answer orientation, [facility], spouse reports still "foggy" compared to baseline but improved today    General Comments      Exercises        Assessment/Plan    PT Assessment Patient needs continued PT services  PT Diagnosis Difficulty walking;Generalized weakness   PT Problem List Decreased strength;Decreased  cognition;Decreased mobility;Decreased activity tolerance;Decreased balance;Decreased safety awareness;Decreased knowledge of use of DME  PT Treatment Interventions DME instruction;Gait training;Functional mobility training;Patient/family education;Therapeutic activities;Therapeutic exercise;Balance training   PT Goals (Current goals can be found in the Care Plan section) Acute Rehab PT Goals PT Goal Formulation: With patient/family Time For Goal Achievement: 01/01/16 Potential to Achieve  Goals: Good    Frequency Min 3X/week   Barriers to discharge        Co-evaluation               End of Session Equipment Utilized During Treatment: Gait belt Activity Tolerance: Patient tolerated treatment well Patient left: in chair;with call bell/phone within reach;with chair alarm set;with family/visitor present           Time: 1410-1433 PT Time Calculation (min) (ACUTE ONLY): 23 min   Charges:   PT Evaluation $PT Eval Moderate Complexity: 1 Procedure     PT G Codes:        Ursula Dermody,KATHrine E 12/25/2015, 4:10 PM Zenovia JarredKati Charley Miske, PT, DPT 12/25/2015 Pager: [phone]

## 2015-12-25 NOTE — Plan of Care (Signed)
Problem: Pain Managment: Goal: General experience of comfort will improve Outcome: Progressing Denies pain at this time.    Problem: Skin Integrity: Goal: Risk for impaired skin integrity will decrease Continue.  Reposition every 2 hours.    Problem: Activity: Goal: Risk for activity intolerance will decrease Outcome: Progressing PT evaluation/ treatment ordered.    Problem: Education: Goal: Knowledge of treatment and prevention of UTI/Pyleonephritis will improve Outcome: Progressing Continue.    Problem: Urinary Elimination: Goal: Signs and symptoms of infection will decrease Outcome: Progressing BP 112/48 mmHg  Pulse 73  Temp(Src) 99.5 F (37.5 C) (Oral)  Resp 18  Ht 5\' 10"  (1.778 m)  Wt 89.4 kg (197 lb 1.5 oz)  BMI 28.28 kg/m2  SpO2 100% room air. WBC 12.3. On IV antibiotics.

## 2015-12-25 NOTE — Progress Notes (Signed)
[facility]                                                                              Patient Demographics  Chad Holden, is a 82 y.o. male, DOB - 05/03/1934, ZOX:[id number]  Admit date - 12/24/2015   Admitting Physician Alba Cory, MD  Outpatient Primary MD for the patient is No primary care provider on file.  LOS - 1   Chief Complaint  Patient presents with  . Urinary Retention      HPI on 12/24/2015 by Dr. Hartley Barefoot Chad Holden is a 82 y.o. male with no significant PMH,he doesn't like to follow with Doctor per wife report. Patient April 1 had tooth extraction. He was prescribe antibiotics and opioids. Since that time he has been confuse, less interactive. He presents today to the ED with difficulty urinating, pain, and worsening confusion. He   He denies chest pain, dyspnea,nausea or vomiting. He had BM yesterday. He has been having, bladder spasm since foley was place.   Evaluation in the ED; UA with too numerous WBC, WBC at 17. CT abdomen: 1. No obstructive uropathy or urologic abnormality. The bladder is completely decompressed by Foley catheter at this time. Small bowel and mesentery containing but non incarcerated appearing right inguinal hernia tracking into the scrotum.  Assessment & Plan   Urinary tract infection with hematuria and urinary retention -Foley catheter in place -UA: TNTC WBC, moderate leukocytes, many bacteria -Blood and Urine cultures pending -Continue ceftriaxone -CT abdomen and pelvis showed no obstructive uropathy or urological abnormality -continue Flomax  Acute encephalopathy -Likely secondary to urinary tract infection -MRI brain: Moderate generalized atrophy with mild periventricular and subcortical white matter disease, moderate ventricular enlargement proportionate to degree of atrophy. No other acute intracranial abnormality. -Spoke with neurology, Dr. Amada Jupiter, who felt all of these findings were chronic and likely  not leading to patient's acute encephalopathy -Continue monitor patient closely  Mild hyponatremia -Continue IV fluids and monitor BMP  Right inguinal hernia -CT abdomen showed small bowel and mesentery containing non-incarcerated appearing right inguinal hernia tracking into the scrotum -Follow up with surgery as an outpatient  Code Status: DNI  Family Communication: Wife at bedside  Disposition Plan: Admitted.   Time Spent in minutes   30 minutes  Procedures  Foley catheter placement  Consults   Neurology, via phone  DVT Prophylaxis  Lovenox  Lab Results  Component Value Date   PLT 155 12/25/2015    Medications  Scheduled Meds: . cefTRIAXone (ROCEPHIN)  IV  1 g Intravenous Q24H  . docusate sodium  100 mg Oral BID  . dorzolamide-timolol  1 drop Both Eyes BID  . enoxaparin (LOVENOX) injection  40 mg Subcutaneous QHS  . latanoprost  1 drop Both Eyes QHS  . sodium chloride flush  3 mL Intravenous Q12H  . tamsulosin  0.4 mg Oral Daily   Continuous Infusions: . sodium chloride 100 mL/hr at 12/25/15 1001  . sodium chloride     PRN Meds:.acetaminophen **OR** acetaminophen, phenazopyridine  Antibiotics    Anti-infectives    Start     Dose/Rate Route Frequency Ordered Stop   12/25/15 1000  cefTRIAXone (ROCEPHIN) 1 g  in dextrose 5 % 50 mL IVPB     1 g 100 mL/hr over 30 Minutes Intravenous Every 24 hours 12/24/15 1834     12/24/15 1530  cefTRIAXone (ROCEPHIN) 1 g in dextrose 5 % 50 mL IVPB     1 g 100 mL/hr over 30 Minutes Intravenous  Once 12/24/15 1517 12/24/15 1610     Subjective:   Chad Holden seen and examined today.  Patient pleasantly confused.  No complaints this morning.     Objective:   Filed Vitals:   12/24/15 1845 12/24/15 1911 12/24/15 2204 12/25/15 0503  BP: 139/76 135/70  112/48  Pulse: 84 109  73  Temp:  98.4 F (36.9 C)  99.5 F (37.5 C)  TempSrc: Oral Oral  Oral  Resp: 16 20  18   Height:   5\' 10"  (1.778 m)   Weight:   89.4 kg (197  lb 1.5 oz)   SpO2: 100% 99%  100%    Wt Readings from Last 3 Encounters:  12/24/15 89.4 kg (197 lb 1.5 oz)     Intake/Output Summary (Last 24 hours) at 12/25/15 1259 Last data filed at 12/25/15 1000  Gross per 24 hour  Intake 1176.67 ml  Output    950 ml  Net 226.67 ml    Exam  General: Well developed, well nourished, NAD, appears stated age  HEENT: NCAT, mucous membranes moist.   Cardiovascular: S1 S2 auscultated, no murmurs, RRR  Respiratory: Clear to auscultation bilaterally   Abdomen: Soft, nontender, nondistended, + bowel sounds  Extremities: warm dry without cyanosis clubbing or edema  Neuro: AAOx1, nonfocal.  Could not answer all questions or follows commands.  Inappropriately smiling.   Psych: Pleasantly confused  Data Review   Micro Results Recent Results (from the past 240 hour(s))  Urine culture     Status: None (Preliminary result)   Collection Time: 12/24/15  3:14 PM  Result Value Ref Range Status   Specimen Description URINE, CATHETERIZED  Final   Special Requests NONE  Final   Culture   Final    TOO YOUNG TO READ Performed at [facility]    Report Status PENDING  Incomplete    Radiology Reports Mr Brain Wo Contrast  12/24/2015  CLINICAL DATA:  Confusion.  Difficulty urinating.  Weakness. EXAM: MRI HEAD WITHOUT CONTRAST TECHNIQUE: Multiplanar, multiecho pulse sequences of the brain and surrounding structures were obtained without intravenous contrast. COMPARISON:  None. FINDINGS: The diffusion-weighted images demonstrate no evidence for acute or subacute infarction. For hemorrhage or mass lesion is present. Moderate generalized atrophy and mild to moderate periventricular white matter changes are present bilaterally. The ventricles are moderately dilated. This is likely proportionate to the degree of atrophy. Normal-pressure hydrocephalus is not excluded. The internal auditory canals are within normal limits bilaterally. Mild white matter  changes extend into the brainstem. Cerebellar atrophy is proportionate to the supratentorial atrophy. Flow is present in the major intracranial arteries. Bilateral lens replacements are present. The globes and orbits are otherwise intact. The paranasal sinuses and the mastoid air cells are clear. Asymmetric degenerative changes are noted on the left at C1-2. The skullbase is otherwise within normal limits. Midline sagittal images are unremarkable. IMPRESSION: 1. Moderate generalized atrophy with mild periventricular and subcortical white matter disease. This likely reflects the sequela of chronic microvascular ischemia. 2. Moderate ventricular enlargement is proportionate to the degree of atrophy. However, normal pressure hydrocephalus is not excluded. 3. No other acute intracranial abnormality. 4. Asymmetric left-sided degenerative changes in  the cervical spine at C1-2. Electronically Signed   By: Marin Robertshristopher  Mattern M.D.   On: 12/24/2015 21:38   Ct Abdomen Pelvis W Contrast  12/24/2015  CLINICAL DATA:  82 year old male with acute difficulty urinating. Confusion. Initial encounter. EXAM: CT ABDOMEN AND PELVIS WITH CONTRAST TECHNIQUE: Multidetector CT imaging of the abdomen and pelvis was performed using the standard protocol following bolus administration of intravenous contrast. CONTRAST:  100mL ISOVUE-300 IOPAMIDOL (ISOVUE-300) INJECTION 61% COMPARISON:  None. FINDINGS: Mild respiratory motion artifact at the lung bases. Mild atelectasis, otherwise negative. No pericardial or pleural effusion. Multilevel degenerative lumbar spondylolisthesis with widespread facet degeneration. No acute osseous abnormality identified. No pelvic free fluid. Foley catheter in the urinary bladder which is completely decompressed. Dystrophic prostate calcifications. Negative rectum. Negative sigmoid colon. Decompressed left colon. Decompressed transverse colon. Negative right colon and appendix. Negative terminal ileum. No dilated  small bowel. There is a small bowel and small bowel mesenteric containing right inguinal hernia which tracks into the scrotum. Associated loops appear normal. No abnormal small bowel loops elsewhere. Decompressed stomach and duodenum. No abdominal free air or free fluid. Negative liver with occasional tiny low-density areas most likely benign cysts. Gallbladder, spleen, pancreas and adrenal glands are normal. Portal venous system is patent. Aortoiliac calcified atherosclerosis noted. Major arterial structures are patent. Bilateral iliac arteries are ectatic. No lymphadenopathy. Bilateral renal enhancement and contrast excretion is symmetric and normal. There is a benign left renal parapelvic cyst. No hydronephrosis or definite pathologic perinephric stranding. No nephrolithiasis identified. Negative left ureter. Negative right ureter. Incidental pelvic phleboliths. IMPRESSION: 1. No obstructive uropathy or urologic abnormality. The bladder is completely decompressed by Foley catheter at this time. 2. Small bowel and mesentery containing but non incarcerated appearing right inguinal hernia tracking into the scrotum. 3. Calcified aortic atherosclerosis. Electronically Signed   By: Odessa FlemingH  Hall M.D.   On: 12/24/2015 16:54    CBC  Recent Labs Lab 12/18/15 1819 12/24/15 1418 12/25/15 0456  WBC 7.2 17.5* 12.3*  HGB 16.0 15.4 12.7*  HCT 46.2 44.2 37.1*  PLT 187 189 155  MCV 91.3 93.2 93.2  MCH 31.6 32.5 31.9  MCHC 34.6 34.8 34.2  RDW 12.4 12.7 12.7  LYMPHSABS 1.5 0.7  --   MONOABS 0.8 1.5*  --   EOSABS 0.0 0.0  --   BASOSABS 0.0 0.0  --     Chemistries   Recent Labs Lab 12/18/15 1819 12/24/15 1418 12/25/15 0456  NA 133* 134* 136  K 4.0 4.2 3.8  CL 99* 99* 106  CO2 23 25 22   GLUCOSE 109* 147* 134*  BUN 19 14 12   CREATININE 1.13 1.08 0.85  CALCIUM 9.2 9.1 8.3*  AST 19 15 13*  ALT 20 18 15*  ALKPHOS 64 65 48  BILITOT 1.5* 1.4* 0.9    ------------------------------------------------------------------------------------------------------------------ estimated creatinine clearance is 79.3 mL/min (by C-G formula based on Cr of 0.85). ------------------------------------------------------------------------------------------------------------------ No results for input(s): HGBA1C in the last 72 hours. ------------------------------------------------------------------------------------------------------------------ No results for input(s): CHOL, HDL, LDLCALC, TRIG, CHOLHDL, LDLDIRECT in the last 72 hours. ------------------------------------------------------------------------------------------------------------------ No results for input(s): TSH, T4TOTAL, T3FREE, THYROIDAB in the last 72 hours.  Invalid input(s): FREET3 ------------------------------------------------------------------------------------------------------------------ No results for input(s): VITAMINB12, FOLATE, FERRITIN, TIBC, IRON, RETICCTPCT in the last 72 hours.  Coagulation profile No results for input(s): INR, PROTIME in the last 168 hours.  No results for input(s): DDIMER in the last 72 hours.  Cardiac Enzymes No results for input(s): CKMB, TROPONINI, MYOGLOBIN in the last 168 hours.  Invalid  input(s): CK ------------------------------------------------------------------------------------------------------------------ Invalid input(s): POCBNP    Akira Adelsberger D.O. on 12/25/2015 at 12:59 PM  Between 7am to 7pm - Pager - [phone]  After 7pm go to [email or web address] - password [email or web address]  And look for the night coverage person covering for me after hours  [facility] Office  [phone]

## 2015-12-26 DIAGNOSIS — G9340 Encephalopathy, unspecified: Secondary | ICD-10-CM | POA: Diagnosis not present

## 2015-12-26 DIAGNOSIS — R410 Disorientation, unspecified: Secondary | ICD-10-CM | POA: Diagnosis not present

## 2015-12-26 DIAGNOSIS — N390 Urinary tract infection, site not specified: Secondary | ICD-10-CM | POA: Diagnosis not present

## 2015-12-26 DIAGNOSIS — D72829 Elevated white blood cell count, unspecified: Secondary | ICD-10-CM | POA: Diagnosis not present

## 2015-12-26 LAB — BASIC METABOLIC PANEL
Anion gap: 7 (ref 5–15)
BUN: 12 mg/dL (ref 6–20)
CHLORIDE: 108 mmol/L (ref 101–111)
CO2: 21 mmol/L — AB (ref 22–32)
CREATININE: 0.84 mg/dL (ref 0.61–1.24)
Calcium: 8 mg/dL — ABNORMAL LOW (ref 8.9–10.3)
GFR calc non Af Amer: >60 mL/min (ref >60)
Glucose, Bld: 130 mg/dL — ABNORMAL HIGH (ref 65–99)
POTASSIUM: 3.7 mmol/L (ref 3.5–5.1)
Sodium: 136 mmol/L (ref 135–145)

## 2015-12-26 LAB — CBC
HEMATOCRIT: 35.9 % — AB (ref 39.0–52.0)
HEMOGLOBIN: 12.6 g/dL — AB (ref 13.0–17.0)
MCH: 32.6 pg (ref 26.0–34.0)
MCHC: 35.1 g/dL (ref 30.0–36.0)
MCV: 92.8 fL (ref 78.0–100.0)
Platelets: 130 10*3/uL — ABNORMAL LOW (ref 150–400)
RBC: 3.87 MIL/uL — AB (ref 4.22–5.81)
RDW: 12.9 % (ref 11.5–15.5)
WBC: 7.8 10*3/uL (ref 4.0–10.5)

## 2015-12-26 NOTE — Progress Notes (Signed)
[facility]                                                                              Patient Demographics  Chad KirschnerBobby Consolo, is a 82 y.o. male, DOB - 1934/02/11, [id number]  Admit date - 12/24/2015   Admitting Physician Alba CoryBelkys A Regalado, MD  Outpatient Primary MD for the patient is No primary care provider on file.  LOS - 2   Chief Complaint  Patient presents with  . Urinary Retention      HPI on 12/24/2015 by Dr. Hartley BarefootBelkys Regalado Overton MamBobby W Zorn is a 82 y.o. male with no significant PMH,he doesn't like to follow with Doctor per wife report. Patient April 1 had tooth extraction. He was prescribe antibiotics and opioids. Since that time he has been confuse, less interactive. He presents today to the ED with difficulty urinating, pain, and worsening confusion. He   He denies chest pain, dyspnea,nausea or vomiting. He had BM yesterday. He has been having, bladder spasm since foley was place.   Evaluation in the ED; UA with too numerous WBC, WBC at 17. CT abdomen: 1. No obstructive uropathy or urologic abnormality. The bladder is completely decompressed by Foley catheter at this time. Small bowel and mesentery containing but non incarcerated appearing right inguinal hernia tracking into the scrotum.  Assessment & Plan   Urinary tract infection with hematuria and urinary retention -Foley catheter in place -UA: TNTC WBC, moderate leukocytes, many bacteria -Blood cultures show no growth to date -Urine culture pending -Continue ceftriaxone -CT abdomen and pelvis showed no obstructive uropathy or urological abnormality -continue Flomax -Will attempt voiding trial today  Acute encephalopathy -Improving  -Likely secondary to urinary tract infection -MRI brain: Moderate generalized atrophy with mild periventricular and subcortical white matter disease, moderate ventricular enlargement proportionate to degree of atrophy. No other acute intracranial abnormality. -Spoke with  neurology, Dr. Amada JupiterKirkpatrick, who felt all of these findings were chronic and likely not leading to patient's acute encephalopathy -Continue monitor patient closely -PT consulted, recommended HHPT  Mild hyponatremia -Resolved, Continue IV fluids and monitor BMP  Right inguinal hernia -CT abdomen showed small bowel and mesentery containing non-incarcerated appearing right inguinal hernia tracking into the scrotum -Follow up with surgery as an outpatient  Code Status: DNI  Family Communication: Wife at bedside  Disposition Plan: Admitted. Will likely discharge patient within 24-48 hours. Pending urine culture  Time Spent in minutes   30 minutes  Procedures  Foley catheter placement  Consults   Neurology, via phone  DVT Prophylaxis  Lovenox  Lab Results  Component Value Date   PLT 130* 12/26/2015    Medications  Scheduled Meds: . cefTRIAXone (ROCEPHIN)  IV  1 g Intravenous Q24H  . docusate sodium  100 mg Oral BID  . dorzolamide-timolol  1 drop Both Eyes BID  . enoxaparin (LOVENOX) injection  40 mg Subcutaneous QHS  . latanoprost  1 drop Both Eyes QHS  . sodium chloride flush  3 mL Intravenous Q12H  . tamsulosin  0.4 mg Oral Daily   Continuous Infusions: . sodium chloride 100 mL/hr at 12/25/15 1001  . sodium chloride 100 mL/hr at 12/26/15 0947   PRN Meds:.acetaminophen **OR** acetaminophen,  phenazopyridine  Antibiotics    Anti-infectives    Start     Dose/Rate Route Frequency Ordered Stop   12/25/15 1000  cefTRIAXone (ROCEPHIN) 1 g in dextrose 5 % 50 mL IVPB     1 g 100 mL/hr over 30 Minutes Intravenous Every 24 hours 12/24/15 1834     12/24/15 1530  cefTRIAXone (ROCEPHIN) 1 g in dextrose 5 % 50 mL IVPB     1 g 100 mL/hr over 30 Minutes Intravenous  Once 12/24/15 1517 12/24/15 1610     Subjective:   Lorenzo Arscott seen and examined today.  Patient denies chest pain, shortness of breath, abdominal pain, nausea or vomiting.   Objective:   Filed Vitals:    12/25/15 0503 12/25/15 1500 12/25/15 2130 12/26/15 0504  BP: 112/48 111/59 148/63 126/76  Pulse: 73 89 84 74  Temp: 99.5 F (37.5 C) 99 F (37.2 C) 98.7 F (37.1 C) 98.3 F (36.8 C)  TempSrc: Oral Oral Oral Oral  Resp: 18 18 18 18   Height:      Weight:      SpO2: 100% 100% 99% 96%    Wt Readings from Last 3 Encounters:  12/24/15 89.4 kg (197 lb 1.5 oz)     Intake/Output Summary (Last 24 hours) at 12/26/15 1146 Last data filed at 12/26/15 0957  Gross per 24 hour  Intake   1735 ml  Output   2825 ml  Net  -1090 ml    Exam  General: Well developed, well nourished, NAD  HEENT: NCAT, mucous membranes moist.   Cardiovascular: S1 S2 auscultated, no murmurs, RRR  Respiratory: Clear to auscultation bilaterally   Abdomen: Soft, nontender, nondistended, + bowel sounds  Extremities: warm dry without cyanosis clubbing or edema  Neuro: AAOx3, nonfocal  Psych: Appropriate mood and affect  Data Review   Micro Results Recent Results (from the past 240 hour(s))  Culture, blood (routine x 2)     Status: None (Preliminary result)   Collection Time: 12/24/15  2:21 PM  Result Value Ref Range Status   Specimen Description BLOOD RIGHT ANTECUBITAL  Final   Special Requests BOTTLES DRAWN AEROBIC AND ANAEROBIC 5 CC EA  Final   Culture   Final    NO GROWTH < 24 HOURS Performed at [facility]    Report Status PENDING  Incomplete  Urine culture     Status: None (Preliminary result)   Collection Time: 12/24/15  3:14 PM  Result Value Ref Range Status   Specimen Description URINE, CATHETERIZED  Final   Special Requests NONE  Final   Culture   Final    TOO YOUNG TO READ Performed at [facility]    Report Status PENDING  Incomplete  Culture, blood (routine x 2)     Status: None (Preliminary result)   Collection Time: 12/24/15  3:17 PM  Result Value Ref Range Status   Specimen Description BLOOD LEFT ANTECUBITAL  Final   Special Requests BOTTLES DRAWN AEROBIC AND  ANAEROBIC 5 CC EA  Final   Culture   Final    NO GROWTH < 24 HOURS Performed at [facility]    Report Status PENDING  Incomplete    Radiology Reports Mr Brain Wo Contrast  12/24/2015  CLINICAL DATA:  Confusion.  Difficulty urinating.  Weakness. EXAM: MRI HEAD WITHOUT CONTRAST TECHNIQUE: Multiplanar, multiecho pulse sequences of the brain and surrounding structures were obtained without intravenous contrast. COMPARISON:  None. FINDINGS: The diffusion-weighted images demonstrate no evidence for  acute or subacute infarction. For hemorrhage or mass lesion is present. Moderate generalized atrophy and mild to moderate periventricular white matter changes are present bilaterally. The ventricles are moderately dilated. This is likely proportionate to the degree of atrophy. Normal-pressure hydrocephalus is not excluded. The internal auditory canals are within normal limits bilaterally. Mild white matter changes extend into the brainstem. Cerebellar atrophy is proportionate to the supratentorial atrophy. Flow is present in the major intracranial arteries. Bilateral lens replacements are present. The globes and orbits are otherwise intact. The paranasal sinuses and the mastoid air cells are clear. Asymmetric degenerative changes are noted on the left at C1-2. The skullbase is otherwise within normal limits. Midline sagittal images are unremarkable. IMPRESSION: 1. Moderate generalized atrophy with mild periventricular and subcortical white matter disease. This likely reflects the sequela of chronic microvascular ischemia. 2. Moderate ventricular enlargement is proportionate to the degree of atrophy. However, normal pressure hydrocephalus is not excluded. 3. No other acute intracranial abnormality. 4. Asymmetric left-sided degenerative changes in the cervical spine at C1-2. Electronically Signed   By: Marin Roberts M.D.   On: 12/24/2015 21:38   Ct Abdomen Pelvis W Contrast  12/24/2015  CLINICAL DATA:   82 year old male with acute difficulty urinating. Confusion. Initial encounter. EXAM: CT ABDOMEN AND PELVIS WITH CONTRAST TECHNIQUE: Multidetector CT imaging of the abdomen and pelvis was performed using the standard protocol following bolus administration of intravenous contrast. CONTRAST:  ISOVUE-300 IOPAMIDOL (ISOVUE-300) INJECTION 61% COMPARISON:  None. FINDINGS: Mild respiratory motion artifact at the lung bases. Mild atelectasis, otherwise negative. No pericardial or pleural effusion. Multilevel degenerative lumbar spondylolisthesis with widespread facet degeneration. No acute osseous abnormality identified. No pelvic free fluid. Foley catheter in the urinary bladder which is completely decompressed. Dystrophic prostate calcifications. Negative rectum. Negative sigmoid colon. Decompressed left colon. Decompressed transverse colon. Negative right colon and appendix. Negative terminal ileum. No dilated small bowel. There is a small bowel and small bowel mesenteric containing right inguinal hernia which tracks into the scrotum. Associated loops appear normal. No abnormal small bowel loops elsewhere. Decompressed stomach and duodenum. No abdominal free air or free fluid. Negative liver with occasional tiny low-density areas most likely benign cysts. Gallbladder, spleen, pancreas and adrenal glands are normal. Portal venous system is patent. Aortoiliac calcified atherosclerosis noted. Major arterial structures are patent. Bilateral iliac arteries are ectatic. No lymphadenopathy. Bilateral renal enhancement and contrast excretion is symmetric and normal. There is a benign left renal parapelvic cyst. No hydronephrosis or definite pathologic perinephric stranding. No nephrolithiasis identified. Negative left ureter. Negative right ureter. Incidental pelvic phleboliths. IMPRESSION: 1. No obstructive uropathy or urologic abnormality. The bladder is completely decompressed by Foley catheter at this time. 2. Small  bowel and mesentery containing but non incarcerated appearing right inguinal hernia tracking into the scrotum. 3. Calcified aortic atherosclerosis. Electronically Signed   By: Odessa Fleming M.D.   On: 12/24/2015 16:54    CBC  Recent Labs Lab 12/24/15 1418 12/25/15 0456 12/26/15 0500  WBC 17.5* 12.3* 7.8  HGB 15.4 12.7* 12.6*  HCT 44.2 37.1* 35.9*  PLT 189 155 130*  MCV 93.2 93.2 92.8  MCH 32.5 31.9 32.6  MCHC 34.8 34.2 35.1  RDW 12.7 12.7 12.9  LYMPHSABS 0.7  --   --   MONOABS 1.5*  --   --   EOSABS 0.0  --   --   BASOSABS 0.0  --   --     Chemistries   Recent Labs Lab 12/24/15 1418 12/25/15 0456 12/26/15  0500  NA 134* 136 136  K 4.2 3.8 3.7  CL 99* 106 108  CO2 25 22 21*  GLUCOSE 147* 134* 130*  BUN CREATININE 1.08 0.85 0.84  CALCIUM 9.1 8.3* 8.0*  AST 15 13*  --   ALT 18 15*  --   ALKPHOS 65 48  --   BILITOT 1.4* 0.9  --    ------------------------------------------------------------------------------------------------------------------ estimated creatinine clearance is 80.3 mL/min (by C-G formula based on Cr of 0.84). ------------------------------------------------------------------------------------------------------------------ No results for input(s): HGBA1C in the last 72 hours. ------------------------------------------------------------------------------------------------------------------ No results for input(s): CHOL, HDL, LDLCALC, TRIG, CHOLHDL, LDLDIRECT in the last 72 hours. ------------------------------------------------------------------------------------------------------------------ No results for input(s): TSH, T4TOTAL, T3FREE, THYROIDAB in the last 72 hours.  Invalid input(s): FREET3 ------------------------------------------------------------------------------------------------------------------ No results for input(s): VITAMINB12, FOLATE, FERRITIN, TIBC, IRON, RETICCTPCT in the last 72 hours.  Coagulation profile No results for  input(s): INR, PROTIME in the last 168 hours.  No results for input(s): DDIMER in the last 72 hours.  Cardiac Enzymes No results for input(s): CKMB, TROPONINI, MYOGLOBIN in the last 168 hours.  Invalid input(s): CK ------------------------------------------------------------------------------------------------------------------ Invalid input(s): POCBNP    Ebin Palazzi D.O. on 12/26/2015 at 11:46 AM  Between 7am to 7pm - Pager - [phone]  After 7pm go to [email or web address] - password [email or web address]  And look for the night coverage person covering for me after hours  [facility] Office  [phone]

## 2015-12-26 NOTE — Progress Notes (Signed)
Spoke with pt's wife at bedside concerning HH. She selected [facility], referral given to in house rep.

## 2015-12-27 DIAGNOSIS — D72829 Elevated white blood cell count, unspecified: Secondary | ICD-10-CM | POA: Diagnosis not present

## 2015-12-27 DIAGNOSIS — N390 Urinary tract infection, site not specified: Secondary | ICD-10-CM | POA: Diagnosis not present

## 2015-12-27 DIAGNOSIS — G9340 Encephalopathy, unspecified: Secondary | ICD-10-CM | POA: Diagnosis not present

## 2015-12-27 DIAGNOSIS — R410 Disorientation, unspecified: Secondary | ICD-10-CM | POA: Diagnosis not present

## 2015-12-27 LAB — URINE CULTURE: Culture: >=100000 — AB

## 2015-12-27 LAB — CBC
HEMATOCRIT: 36.8 % — AB (ref 39.0–52.0)
HEMOGLOBIN: 12.6 g/dL — AB (ref 13.0–17.0)
MCH: 31.7 pg (ref 26.0–34.0)
MCHC: 34.2 g/dL (ref 30.0–36.0)
MCV: 92.7 fL (ref 78.0–100.0)
Platelets: 167 10*3/uL (ref 150–400)
RBC: 3.97 MIL/uL — AB (ref 4.22–5.81)
RDW: 12.7 % (ref 11.5–15.5)
WBC: 6.4 10*3/uL (ref 4.0–10.5)

## 2015-12-27 LAB — BASIC METABOLIC PANEL
Anion gap: 5 (ref 5–15)
BUN: 9 mg/dL (ref 6–20)
CHLORIDE: 107 mmol/L (ref 101–111)
CO2: 23 mmol/L (ref 22–32)
CREATININE: 0.85 mg/dL (ref 0.61–1.24)
Calcium: 8.1 mg/dL — ABNORMAL LOW (ref 8.9–10.3)
GFR calc Af Amer: >60 mL/min (ref >60)
GFR calc non Af Amer: >60 mL/min (ref >60)
GLUCOSE: 114 mg/dL — AB (ref 65–99)
POTASSIUM: 3.5 mmol/L (ref 3.5–5.1)
SODIUM: 135 mmol/L (ref 135–145)

## 2015-12-27 MED ORDER — PHENAZOPYRIDINE HCL 100 MG PO TABS: 100.0000 mg | ORAL_TABLET | Freq: Three times a day (TID) | ORAL | Status: DC | PRN

## 2015-12-27 MED ORDER — TAMSULOSIN HCL 0.4 MG PO CAPS: 0.4000 mg | ORAL_CAPSULE | Freq: Every day | ORAL | Status: DC

## 2015-12-27 MED ORDER — CEFUROXIME AXETIL 500 MG PO TABS: 500.0000 mg | ORAL_TABLET | Freq: Two times a day (BID) | ORAL | Status: DC

## 2015-12-27 NOTE — Discharge Instructions (Signed)

## 2015-12-27 NOTE — Care Management Note (Signed)
Case Management Note  Patient Details  Name: Overton MamBobby W Catherman MRN: [id number] Date of Birth: 10-Aug-1934  Subjective/Objective:      Acute encephalopathy, UTI              Action/Plan: Discharge Planning: AVS reviewed:    PCP -Dr Marden Nobleobert Gates  NCM spoke to pt's wife, Constance HawDoris Tingley # [phone]. She can afford medications at home. Contacted [facility] DME rep for RW with seat for home. [facility]ed [facility] Liaison [facility]. [facility]. HH was arranged earlier this week. Please see NCM notes.   Expected Discharge Date:  12/27/2015            Expected Discharge Plan:  Home w Home Health Services  In-House Referral:  NA  Discharge planning Services  CM Consult  Post Acute Care Choice:  NA Choice offered to:  NA  DME Arranged:  Walker rolling with seat DME Agency:  [facility]  HH Arranged:  [facility]  [facility]  Status of Service:  Completed, signed off  Medicare Important Message Given:    Date Medicare IM Given:    Medicare IM give by:    Date Additional Medicare IM Given:    Additional Medicare Important Message give by:     If discussed at Long Length of Stay Meetings, dates discussed:    Additional Comments:  Elliot CousinShavis, Phelicia Dantes Ellen, RN 12/27/2015, 11:04 AM

## 2015-12-27 NOTE — Discharge Summary (Signed)
Physician Discharge Summary  Chad Holden:[id number] DOB: 1933/12/10 DOA: 12/24/2015  PCP: No primary care provider on file.  Admit date: 12/24/2015 Discharge date: 12/27/2015  Time spent: 45 minutes  Recommendations for Outpatient Follow-up:  Patient will be discharged to home with home health PT.  Patient will need to follow up with primary care provider within one week of discharge.  Follow up with urology. Patient should continue medications as prescribed.  Patient should follow a regular diet.   Discharge Diagnoses:  Urinary tract infection with hematuria and urinary retention Acute encephalopathy Mild hyponatremia Right inguinal hernia  Discharge Condition: Stable  Diet recommendation: Regular  Filed Weights   12/24/15 2204  Weight: 89.4 kg (197 lb 1.5 oz)    History of present illness:  on 12/24/2015 by Dr. Hartley Barefoot Chad Holden is a 82 y.o. male with no significant PMH,he doesn't like to follow with Doctor per wife report. Patient April 1 had tooth extraction. He was prescribe antibiotics and opioids. Since that time he has been confuse, less interactive. He presents today to the ED with difficulty urinating, pain, and worsening confusion. He   He denies chest pain, dyspnea,nausea or vomiting. He had BM yesterday. He has been having, bladder spasm since foley was place.   Evaluation in the ED; UA with too numerous WBC, WBC at 17. CT abdomen: 1. No obstructive uropathy or urologic abnormality. The bladder is completely decompressed by Foley catheter at this time. Small bowel and mesentery containing but non incarcerated appearing right inguinal hernia tracking into the scrotum.  Hospital Course:  Urinary tract infection with hematuria and urinary retention -It seems that patient has been using Depends -UA: TNTC WBC, moderate leukocytes, many bacteria -Blood cultures show no growth to date -Urine culture >100K GNR -Initially placed on ceftriaxone -CT  abdomen and pelvis showed no obstructive uropathy or urological abnormality -continue Flomax -Patient able void -Will discharge with ceftin -Recommend patient follow up with urology -Spoke with wife about changing Depends more frequently   Acute encephalopathy -Improving  -Likely secondary to urinary tract infection -MRI brain: Moderate generalized atrophy with mild periventricular and subcortical white matter disease, moderate ventricular enlargement proportionate to degree of atrophy. No other acute intracranial abnormality. -Spoke with neurology, Dr. Amada Jupiter, who felt all of these findings were chronic and likely not leading to patient's acute encephalopathy -Continue monitor patient closely -PT consulted, recommended HHPT  Mild hyponatremia -Resolved  Right inguinal hernia -CT abdomen showed small bowel and mesentery containing non-incarcerated appearing right inguinal hernia tracking into the scrotum -Follow up with surgery as an outpatient  Code Status: DNI  Procedures  Foley catheter placement  Consults  Neurology, via phone  Discharge Exam: Filed Vitals:   12/26/15 2105 12/27/15 0605  BP: 139/80 117/84  Pulse: 72 66  Temp: 98.5 F (36.9 C) 97.7 F (36.5 C)  Resp: 18 20   Exam  General: Well developed, well nourished, NAD  HEENT: NCAT, mucous membranes moist.   Cardiovascular: S1 S2 auscultated, no murmurs, RRR  Respiratory: Clear to auscultation bilaterally  Abdomen: Soft, nontender, nondistended, + bowel sounds  Extremities: warm dry without cyanosis clubbing or edema  Neuro: AAOx3, nonfocal  Psych: Appropriate mood and affect, pleasant  Discharge Instructions      Discharge Instructions    Discharge instructions    Complete by:  As directed   Patient will be discharged to home with home health PT.  Patient will need to follow up with primary care provider within one  week of discharge.  Follow up with urology. Patient should continue  medications as prescribed.  Patient should follow a regular diet.            Medication List    TAKE these medications        cefUROXime 500 MG tablet  Commonly known as:  CEFTIN  Take 1 tablet (500 mg total) by mouth 2 (two) times daily with a meal.     dorzolamide-timolol 22.3-6.8 MG/ML ophthalmic solution  Commonly known as:  COSOPT  PLACE 1 DROP IN EACH EYE EVERY 12 HOURS     ibuprofen 200 MG tablet  Commonly known as:  ADVIL,MOTRIN  Take 200 mg by mouth every 6 (six) hours as needed for moderate pain.     latanoprost 0.005 % ophthalmic solution  Commonly known as:  XALATAN  Place 1 drop into both eyes at bedtime.     phenazopyridine 100 MG tablet  Commonly known as:  PYRIDIUM  Take 1 tablet (100 mg total) by mouth 3 (three) times daily as needed (bladderspasm.).     tamsulosin 0.4 MG Caps capsule  Commonly known as:  FLOMAX  Take 1 capsule (0.4 mg total) by mouth daily. Use for urinary retention.       No Known Allergies Follow-up Information    Follow up with Primary care physician. Schedule an appointment as soon as possible for a visit in 1 week.   Why:  Hospital follow up, establish care      Follow up with [facility] Schedule an appointment as soon as possible for a visit in 2 weeks.   Why:  Hospital follow up, urinary retention vs incontinence    Contact information:   [address]  [address] [place] [address] [phone]        The results of significant diagnostics from this hospitalization (including imaging, microbiology, ancillary and laboratory) are listed below for reference.    Significant Diagnostic Studies: Mr Brain Wo Contrast  12/24/2015  CLINICAL DATA:  Confusion.  Difficulty urinating.  Weakness. EXAM: MRI HEAD WITHOUT CONTRAST TECHNIQUE: Multiplanar, multiecho pulse sequences of the brain and surrounding structures were obtained without intravenous contrast. COMPARISON:  None. FINDINGS: The diffusion-weighted  images demonstrate no evidence for acute or subacute infarction. For hemorrhage or mass lesion is present. Moderate generalized atrophy and mild to moderate periventricular white matter changes are present bilaterally. The ventricles are moderately dilated. This is likely proportionate to the degree of atrophy. Normal-pressure hydrocephalus is not excluded. The internal auditory canals are within normal limits bilaterally. Mild white matter changes extend into the brainstem. Cerebellar atrophy is proportionate to the supratentorial atrophy. Flow is present in the major intracranial arteries. Bilateral lens replacements are present. The globes and orbits are otherwise intact. The paranasal sinuses and the mastoid air cells are clear. Asymmetric degenerative changes are noted on the left at C1-2. The skullbase is otherwise within normal limits. Midline sagittal images are unremarkable. IMPRESSION: 1. Moderate generalized atrophy with mild periventricular and subcortical white matter disease. This likely reflects the sequela of chronic microvascular ischemia. 2. Moderate ventricular enlargement is proportionate to the degree of atrophy. However, normal pressure hydrocephalus is not excluded. 3. No other acute intracranial abnormality. 4. Asymmetric left-sided degenerative changes in the cervical spine at C1-2. Electronically Signed   By: Marin Roberts M.D.   On: 12/24/2015 21:38   Ct Abdomen Pelvis W Contrast  12/24/2015  CLINICAL DATA:  82 year old male with acute difficulty urinating. Confusion.  Initial encounter. EXAM: CT ABDOMEN AND PELVIS WITH CONTRAST TECHNIQUE: Multidetector CT imaging of the abdomen and pelvis was performed using the standard protocol following bolus administration of intravenous contrast. CONTRAST:  ISOVUE-300 IOPAMIDOL (ISOVUE-300) INJECTION 61% COMPARISON:  None. FINDINGS: Mild respiratory motion artifact at the lung bases. Mild atelectasis, otherwise negative. No pericardial  or pleural effusion. Multilevel degenerative lumbar spondylolisthesis with widespread facet degeneration. No acute osseous abnormality identified. No pelvic free fluid. Foley catheter in the urinary bladder which is completely decompressed. Dystrophic prostate calcifications. Negative rectum. Negative sigmoid colon. Decompressed left colon. Decompressed transverse colon. Negative right colon and appendix. Negative terminal ileum. No dilated small bowel. There is a small bowel and small bowel mesenteric containing right inguinal hernia which tracks into the scrotum. Associated loops appear normal. No abnormal small bowel loops elsewhere. Decompressed stomach and duodenum. No abdominal free air or free fluid. Negative liver with occasional tiny low-density areas most likely benign cysts. Gallbladder, spleen, pancreas and adrenal glands are normal. Portal venous system is patent. Aortoiliac calcified atherosclerosis noted. Major arterial structures are patent. Bilateral iliac arteries are ectatic. No lymphadenopathy. Bilateral renal enhancement and contrast excretion is symmetric and normal. There is a benign left renal parapelvic cyst. No hydronephrosis or definite pathologic perinephric stranding. No nephrolithiasis identified. Negative left ureter. Negative right ureter. Incidental pelvic phleboliths. IMPRESSION: 1. No obstructive uropathy or urologic abnormality. The bladder is completely decompressed by Foley catheter at this time. 2. Small bowel and mesentery containing but non incarcerated appearing right inguinal hernia tracking into the scrotum. 3. Calcified aortic atherosclerosis. Electronically Signed   By: Odessa Fleming M.D.   On: 12/24/2015 16:54    Microbiology: Recent Results (from the past 240 hour(s))  Culture, blood (routine x 2)     Status: None (Preliminary result)   Collection Time: 12/24/15  2:21 PM  Result Value Ref Range Status   Specimen Description BLOOD RIGHT ANTECUBITAL  Final   Special  Requests BOTTLES DRAWN AEROBIC AND ANAEROBIC 5 CC EA  Final   Culture   Final    NO GROWTH 2 DAYS Performed at [facility]    Report Status PENDING  Incomplete  Urine culture     Status: Abnormal (Preliminary result)   Collection Time: 12/24/15  3:14 PM  Result Value Ref Range Status   Specimen Description URINE, CATHETERIZED  Final   Special Requests NONE  Final   Culture >=100,000 COLONIES/mL GRAM NEGATIVE RODS (A)  Final   Report Status PENDING  Incomplete  Culture, blood (routine x 2)     Status: None (Preliminary result)   Collection Time: 12/24/15  3:17 PM  Result Value Ref Range Status   Specimen Description BLOOD LEFT ANTECUBITAL  Final   Special Requests BOTTLES DRAWN AEROBIC AND ANAEROBIC 5 CC EA  Final   Culture   Final    NO GROWTH 2 DAYS Performed at [facility]    Report Status PENDING  Incomplete     Labs: Basic Metabolic Panel:  Recent Labs Lab 12/24/15 1418 12/25/15 0456 12/26/15 0500 12/27/15 0454  NA 134* 136 136 135  K 4.2 3.8 3.7 3.5  CL 99* 106 108 107  CO2 25 22 21* 23  GLUCOSE 147* 134* 130* 114*  BUN CREATININE 1.08 0.85 0.84 0.85  CALCIUM 9.1 8.3* 8.0* 8.1*   Liver Function Tests:  Recent Labs Lab 12/24/15 1418 12/25/15 0456  AST 15 13*  ALT 18 15*  ALKPHOS 65 48  BILITOT 1.4* 0.9  PROT 7.3 5.7*  ALBUMIN 4.0 3.2*   No results for input(s): LIPASE, AMYLASE in the last 168 hours. No results for input(s): AMMONIA in the last 168 hours. CBC:  Recent Labs Lab 12/24/15 1418 12/25/15 0456 12/26/15 0500 12/27/15 0454  WBC 17.5* 12.3* 7.8 6.4  NEUTROABS 15.2*  --   --   --   HGB 15.4 12.7* 12.6* 12.6*  HCT 44.2 37.1* 35.9* 36.8*  MCV 93.2 93.2 92.8 92.7  PLT 189 155 130* 167   Cardiac Enzymes: No results for input(s): CKTOTAL, CKMB, CKMBINDEX, TROPONINI in the last 168 hours. BNP: BNP (last 3 results) No results for input(s): BNP in the last 8760 hours.  ProBNP (last 3 results) No results for  input(s): PROBNP in the last 8760 hours.  CBG: No results for input(s): GLUCAP in the last 168 hours.     SignedEdsel Petrin:  Clinton Dragone  [facility] 12/27/2015, 10:45 AM

## 2015-12-27 NOTE — Progress Notes (Signed)
Seated walker delivered to pt room. DC instructions reviewed w/ pt and wife. Both verbalize understanding and all questions answered. Wife aware of need to p/u scripts at pharmacy on way home. Pt d/c in w/c in stable condition by NT to son's car. Pt wife in possession of d/c instructions, seated walker, and all personal belongings.

## 2015-12-27 NOTE — Care Management Important Message (Signed)
Important Message  Patient Details  Name: Chad Holden MRN: [id number] Date of Birth: [birth year]   Medicare Important Message Given:  Yes    Elliot CousinShavis, Mason Dibiasio Ellen, RN 12/27/2015, 11:11 AM

## 2015-12-28 DIAGNOSIS — N390 Urinary tract infection, site not specified: Secondary | ICD-10-CM | POA: Diagnosis not present

## 2015-12-28 DIAGNOSIS — R319 Hematuria, unspecified: Secondary | ICD-10-CM | POA: Diagnosis not present

## 2015-12-28 DIAGNOSIS — G9340 Encephalopathy, unspecified: Secondary | ICD-10-CM | POA: Diagnosis not present

## 2015-12-28 DIAGNOSIS — E871 Hypo-osmolality and hyponatremia: Secondary | ICD-10-CM | POA: Diagnosis not present

## 2015-12-28 DIAGNOSIS — R339 Retention of urine, unspecified: Secondary | ICD-10-CM | POA: Diagnosis not present

## 2015-12-28 DIAGNOSIS — K4090 Unilateral inguinal hernia, without obstruction or gangrene, not specified as recurrent: Secondary | ICD-10-CM | POA: Diagnosis not present

## 2015-12-29 DIAGNOSIS — N390 Urinary tract infection, site not specified: Secondary | ICD-10-CM | POA: Diagnosis not present

## 2015-12-29 DIAGNOSIS — R339 Retention of urine, unspecified: Secondary | ICD-10-CM | POA: Diagnosis not present

## 2015-12-29 DIAGNOSIS — R319 Hematuria, unspecified: Secondary | ICD-10-CM | POA: Diagnosis not present

## 2015-12-29 DIAGNOSIS — E871 Hypo-osmolality and hyponatremia: Secondary | ICD-10-CM | POA: Diagnosis not present

## 2015-12-29 DIAGNOSIS — G9340 Encephalopathy, unspecified: Secondary | ICD-10-CM | POA: Diagnosis not present

## 2015-12-29 DIAGNOSIS — K4090 Unilateral inguinal hernia, without obstruction or gangrene, not specified as recurrent: Secondary | ICD-10-CM | POA: Diagnosis not present

## 2015-12-29 LAB — CULTURE, BLOOD (ROUTINE X 2)
CULTURE: NO GROWTH
Culture: NO GROWTH

## 2015-12-31 DIAGNOSIS — R4189 Other symptoms and signs involving cognitive functions and awareness: Secondary | ICD-10-CM | POA: Diagnosis not present

## 2015-12-31 DIAGNOSIS — E871 Hypo-osmolality and hyponatremia: Secondary | ICD-10-CM | POA: Diagnosis not present

## 2015-12-31 DIAGNOSIS — N390 Urinary tract infection, site not specified: Secondary | ICD-10-CM | POA: Diagnosis not present

## 2015-12-31 DIAGNOSIS — F329 Major depressive disorder, single episode, unspecified: Secondary | ICD-10-CM | POA: Diagnosis not present

## 2015-12-31 DIAGNOSIS — K4090 Unilateral inguinal hernia, without obstruction or gangrene, not specified as recurrent: Secondary | ICD-10-CM | POA: Diagnosis not present

## 2015-12-31 DIAGNOSIS — H409 Unspecified glaucoma: Secondary | ICD-10-CM | POA: Diagnosis not present

## 2015-12-31 DIAGNOSIS — R4586 Emotional lability: Secondary | ICD-10-CM | POA: Diagnosis not present

## 2015-12-31 DIAGNOSIS — R339 Retention of urine, unspecified: Secondary | ICD-10-CM | POA: Diagnosis not present

## 2015-12-31 DIAGNOSIS — R319 Hematuria, unspecified: Secondary | ICD-10-CM | POA: Diagnosis not present

## 2015-12-31 DIAGNOSIS — N400 Enlarged prostate without lower urinary tract symptoms: Secondary | ICD-10-CM | POA: Diagnosis not present

## 2015-12-31 DIAGNOSIS — M25569 Pain in unspecified knee: Secondary | ICD-10-CM | POA: Diagnosis not present

## 2015-12-31 DIAGNOSIS — G9340 Encephalopathy, unspecified: Secondary | ICD-10-CM | POA: Diagnosis not present

## 2015-12-31 DIAGNOSIS — K649 Unspecified hemorrhoids: Secondary | ICD-10-CM | POA: Diagnosis not present

## 2016-01-02 DIAGNOSIS — R339 Retention of urine, unspecified: Secondary | ICD-10-CM | POA: Diagnosis not present

## 2016-01-02 DIAGNOSIS — N390 Urinary tract infection, site not specified: Secondary | ICD-10-CM | POA: Diagnosis not present

## 2016-01-02 DIAGNOSIS — K4090 Unilateral inguinal hernia, without obstruction or gangrene, not specified as recurrent: Secondary | ICD-10-CM | POA: Diagnosis not present

## 2016-01-02 DIAGNOSIS — E871 Hypo-osmolality and hyponatremia: Secondary | ICD-10-CM | POA: Diagnosis not present

## 2016-01-02 DIAGNOSIS — R319 Hematuria, unspecified: Secondary | ICD-10-CM | POA: Diagnosis not present

## 2016-01-02 DIAGNOSIS — G9340 Encephalopathy, unspecified: Secondary | ICD-10-CM | POA: Diagnosis not present

## 2016-01-05 DIAGNOSIS — E871 Hypo-osmolality and hyponatremia: Secondary | ICD-10-CM | POA: Diagnosis not present

## 2016-01-05 DIAGNOSIS — R319 Hematuria, unspecified: Secondary | ICD-10-CM | POA: Diagnosis not present

## 2016-01-05 DIAGNOSIS — R339 Retention of urine, unspecified: Secondary | ICD-10-CM | POA: Diagnosis not present

## 2016-01-05 DIAGNOSIS — K4090 Unilateral inguinal hernia, without obstruction or gangrene, not specified as recurrent: Secondary | ICD-10-CM | POA: Diagnosis not present

## 2016-01-05 DIAGNOSIS — N390 Urinary tract infection, site not specified: Secondary | ICD-10-CM | POA: Diagnosis not present

## 2016-01-05 DIAGNOSIS — G9340 Encephalopathy, unspecified: Secondary | ICD-10-CM | POA: Diagnosis not present

## 2016-01-07 DIAGNOSIS — E871 Hypo-osmolality and hyponatremia: Secondary | ICD-10-CM | POA: Diagnosis not present

## 2016-01-07 DIAGNOSIS — R319 Hematuria, unspecified: Secondary | ICD-10-CM | POA: Diagnosis not present

## 2016-01-07 DIAGNOSIS — N390 Urinary tract infection, site not specified: Secondary | ICD-10-CM | POA: Diagnosis not present

## 2016-01-07 DIAGNOSIS — G9340 Encephalopathy, unspecified: Secondary | ICD-10-CM | POA: Diagnosis not present

## 2016-01-07 DIAGNOSIS — K4090 Unilateral inguinal hernia, without obstruction or gangrene, not specified as recurrent: Secondary | ICD-10-CM | POA: Diagnosis not present

## 2016-01-07 DIAGNOSIS — R339 Retention of urine, unspecified: Secondary | ICD-10-CM | POA: Diagnosis not present

## 2016-01-09 DIAGNOSIS — G9340 Encephalopathy, unspecified: Secondary | ICD-10-CM | POA: Diagnosis not present

## 2016-01-09 DIAGNOSIS — R339 Retention of urine, unspecified: Secondary | ICD-10-CM | POA: Diagnosis not present

## 2016-01-09 DIAGNOSIS — E871 Hypo-osmolality and hyponatremia: Secondary | ICD-10-CM | POA: Diagnosis not present

## 2016-01-09 DIAGNOSIS — K4090 Unilateral inguinal hernia, without obstruction or gangrene, not specified as recurrent: Secondary | ICD-10-CM | POA: Diagnosis not present

## 2016-01-09 DIAGNOSIS — N390 Urinary tract infection, site not specified: Secondary | ICD-10-CM | POA: Diagnosis not present

## 2016-01-09 DIAGNOSIS — R319 Hematuria, unspecified: Secondary | ICD-10-CM | POA: Diagnosis not present

## 2016-01-12 DIAGNOSIS — R319 Hematuria, unspecified: Secondary | ICD-10-CM | POA: Diagnosis not present

## 2016-01-12 DIAGNOSIS — R339 Retention of urine, unspecified: Secondary | ICD-10-CM | POA: Diagnosis not present

## 2016-01-12 DIAGNOSIS — K4090 Unilateral inguinal hernia, without obstruction or gangrene, not specified as recurrent: Secondary | ICD-10-CM | POA: Diagnosis not present

## 2016-01-12 DIAGNOSIS — G9340 Encephalopathy, unspecified: Secondary | ICD-10-CM | POA: Diagnosis not present

## 2016-01-12 DIAGNOSIS — E871 Hypo-osmolality and hyponatremia: Secondary | ICD-10-CM | POA: Diagnosis not present

## 2016-01-12 DIAGNOSIS — N390 Urinary tract infection, site not specified: Secondary | ICD-10-CM | POA: Diagnosis not present

## 2016-01-14 DIAGNOSIS — E871 Hypo-osmolality and hyponatremia: Secondary | ICD-10-CM | POA: Diagnosis not present

## 2016-01-14 DIAGNOSIS — R319 Hematuria, unspecified: Secondary | ICD-10-CM | POA: Diagnosis not present

## 2016-01-14 DIAGNOSIS — N390 Urinary tract infection, site not specified: Secondary | ICD-10-CM | POA: Diagnosis not present

## 2016-01-14 DIAGNOSIS — R339 Retention of urine, unspecified: Secondary | ICD-10-CM | POA: Diagnosis not present

## 2016-01-14 DIAGNOSIS — G9340 Encephalopathy, unspecified: Secondary | ICD-10-CM | POA: Diagnosis not present

## 2016-01-14 DIAGNOSIS — K4090 Unilateral inguinal hernia, without obstruction or gangrene, not specified as recurrent: Secondary | ICD-10-CM | POA: Diagnosis not present

## 2016-01-19 DIAGNOSIS — R351 Nocturia: Secondary | ICD-10-CM | POA: Diagnosis not present

## 2016-01-19 DIAGNOSIS — N3941 Urge incontinence: Secondary | ICD-10-CM | POA: Diagnosis not present

## 2016-01-20 ENCOUNTER — Other Ambulatory Visit: Payer: Self-pay

## 2016-01-20 DIAGNOSIS — E871 Hypo-osmolality and hyponatremia: Secondary | ICD-10-CM | POA: Diagnosis not present

## 2016-01-20 DIAGNOSIS — K4090 Unilateral inguinal hernia, without obstruction or gangrene, not specified as recurrent: Secondary | ICD-10-CM | POA: Diagnosis not present

## 2016-01-20 DIAGNOSIS — R32 Unspecified urinary incontinence: Secondary | ICD-10-CM

## 2016-01-20 DIAGNOSIS — G9340 Encephalopathy, unspecified: Secondary | ICD-10-CM | POA: Diagnosis not present

## 2016-01-20 DIAGNOSIS — R339 Retention of urine, unspecified: Secondary | ICD-10-CM | POA: Diagnosis not present

## 2016-01-20 DIAGNOSIS — R319 Hematuria, unspecified: Secondary | ICD-10-CM | POA: Diagnosis not present

## 2016-01-20 DIAGNOSIS — N390 Urinary tract infection, site not specified: Secondary | ICD-10-CM

## 2016-01-20 NOTE — Patient Outreach (Signed)
[facility]) Care Management  01/20/2016  [place] MamBobby W Frie 1933-12-03 [id number]   SUBJECTIVE:  Telephone call to patient regarding [facility] referral.  HIPAA verified with patient. Patient gave verbal permission to speak with his wife Constance HawDoris Veal if needed regarding any of his medical information.  Patient and wife were on phone jointly.  [facility].  Patient agreed to have nurse case manager follow up for transition of care calls.  Patient confirms he was in the hospital on 12/24/15 to 12/27/15 for urinary tract infection. Patient states he has completed his antibiotics.  Wife states patient is having incontinence.  Wife states this started after patient had the urinary tract infections.  States patient had an appointment with the urologist, Dr. McDermick on Monday 01/19/16.  Wife states, "Dr. McDermick wanted to wait before treating the incontinence.  States patient may be having this due to the recent urinary tract infection and would like to see if the incontinence clears up on its own."  Wife states patient has a follow up visit with the urologist on 03/04/16. Patients wife states patient had an appointment with Dr. Marden Nobleobert Gates on 12/31/15 and has a follow up appointment on 01/27/16.  Wife states this was patients first appointment with Dr. Kevan NyGates. States patient did not have a primary MD prior to this visit on 12/31/15.   Patient states he does have some back trouble.  Patient states he does not see any doctors for it or take any prescribed medication for back pain.  Patients wife states [facility] is [facility] patient. Wife states this week should be patients last visit with home health physical therapy.  Wife states nursing will be seeing patient again this week.  Patient denies any other health conditions. Patient denies any falls in the past year.   ASSESSMENT: [facility] referral.   Depression screening:  PHQ2 - 4,   PHQ9 - 6  PLAN:  RNCM will refer  patient to community case management for transition of care follow up.   George InaDavina Saman Umstead RN,BSN,CCM [facility] Telephonic  [phone]

## 2016-01-21 ENCOUNTER — Other Ambulatory Visit: Payer: Self-pay | Admitting: *Deleted

## 2016-01-21 NOTE — Patient Outreach (Signed)
[facility]) Care Management  01/21/2016  Chad Holden 02/10/1934 [id number]  Transition of care  RN received a referral on 5/10 for this pt that discharged from the facility on 4/15. Initial outreach attempt unsuccessful. Will continue to reach pt for possible [facility] services.  Elliot CousinLisa Ebany Bowermaster, RN Care Management Coordinator [facility] Main Office [phone]

## 2016-01-22 DIAGNOSIS — E871 Hypo-osmolality and hyponatremia: Secondary | ICD-10-CM | POA: Diagnosis not present

## 2016-01-22 DIAGNOSIS — R319 Hematuria, unspecified: Secondary | ICD-10-CM | POA: Diagnosis not present

## 2016-01-22 DIAGNOSIS — R339 Retention of urine, unspecified: Secondary | ICD-10-CM | POA: Diagnosis not present

## 2016-01-22 DIAGNOSIS — N390 Urinary tract infection, site not specified: Secondary | ICD-10-CM | POA: Diagnosis not present

## 2016-01-22 DIAGNOSIS — K4090 Unilateral inguinal hernia, without obstruction or gangrene, not specified as recurrent: Secondary | ICD-10-CM | POA: Diagnosis not present

## 2016-01-22 DIAGNOSIS — G9340 Encephalopathy, unspecified: Secondary | ICD-10-CM | POA: Diagnosis not present

## 2016-01-26 ENCOUNTER — Other Ambulatory Visit: Payer: Self-pay | Admitting: *Deleted

## 2016-01-26 DIAGNOSIS — N390 Urinary tract infection, site not specified: Secondary | ICD-10-CM

## 2016-01-26 NOTE — Patient Outreach (Signed)
[facility]) Care Management  01/26/2016  [name] December 22, 1933 [id number]  Discharged on 1/15 UTI/ Transition of care initial contact today.  RN spoke with pt's caregiver spouse today who indicates pt has had some issues with specimen all clear for UTI to urologist indicates pt reinfected and restarted on antibiotics. Reports follow up appointment with Dr. Kevan NyGates however due to pt's ongoing treatment for his UTI Dr. Kevan NyGates has requested a later follow up appointment after pt has completed his ongoing treatment.  Reports pt's confusion is much better however continues to indicate pt is depressed and receptive to counseling with [facility]. No other medical condition to discussed at this time as pt currently involved with [facility] for PT and a nurse has been doing lab for urinary specimens. Rn able to completed the transition of care template and caregiver receptive to ongoing transition of care follow up calls of inquired on pt's ongoing recovery. Caregiver has indicated based upon [facility]'s [facility] pt remains open to a home visit to further engage on his depression. Caregiver also receptive to a Child psychotherapistsocial worker for counseling however aware that pt may need to consult with a psychiatrist for possible therapy and/or prescriptive medications based upon the initial evaluation. RN inquired if pt's depression remains the same based upon the evaluation several days ago or worsen. Caregiver indicates it is not worse but remains the same with the same answers provider initially and caregiver with understanding that pt may need additional counseling and treatment with a referral possibly from his primary provider. RN offered to refer to a Child psychotherapistsocial worker and possible schedule a home visit together to address pt's ongoing needs. Will follow up in few days with possible arrangements as requested for an appointment next Tuesday or Wednesday both pt and caregiver are available.   Chad CousinLisa  Matthews, RN Care Management Coordinator [facility] Main Office [phone]

## 2016-01-28 ENCOUNTER — Other Ambulatory Visit: Payer: Self-pay | Admitting: *Deleted

## 2016-01-30 ENCOUNTER — Other Ambulatory Visit: Payer: Self-pay | Admitting: *Deleted

## 2016-01-30 NOTE — Patient Outreach (Signed)
[facility]) Care Management  01/30/2016  Chad Holden [birth year] [id number]  RN spoke with pt's caregiver concerning pt and verified since pt's discharge April 15 no Ed or readmission have occur over the last 31 days, Pt has attended all medical appointments and has follow up appointments scheduled. Wife has also reported pt is on his second round of antibiotics and taking all his medications as prescribed. The initial plan of care and goals have been met since his discharge and pt does not have any additional needs for this RN to address at this time however due to pt's depression screen [name] (Education officer, museum) has been assigned this pt for counseling in the community and caregiver aware that there will be available community resources if needed at the time of the scheduled home visit. Caregiver will understanding as RN inquired on any other needs that can be addressed at this time (none mentioned). Caregiver aware RN is available while pt is actively involved with the assigned social worker for any additional consultations. Case will be closed on behalf of this RN at this time with again goals met from the time of discharge from the facility.   [name], RN Care Management Coordinator [place] Main Office [phone]  [name], RN Care Management Coordinator [place] Office [phone]

## 2016-01-30 NOTE — Patient Outreach (Signed)
CSW attempting to reach patient to schedule visit- message left for patient and will await call back and/or try again on Monday.   Reece LevyJanet Garvey Westcott, MSW, LCSW Clinical Social Worker  [facility] [phone]

## 2016-02-02 ENCOUNTER — Ambulatory Visit: Payer: Self-pay | Admitting: *Deleted

## 2016-02-02 ENCOUNTER — Other Ambulatory Visit: Payer: Self-pay | Admitting: *Deleted

## 2016-02-05 ENCOUNTER — Encounter: Payer: Self-pay | Admitting: *Deleted

## 2016-02-05 ENCOUNTER — Other Ambulatory Visit: Payer: Self-pay | Admitting: *Deleted

## 2016-02-05 DIAGNOSIS — B961 Klebsiella pneumoniae [K. pneumoniae] as the cause of diseases classified elsewhere: Secondary | ICD-10-CM | POA: Diagnosis not present

## 2016-02-05 DIAGNOSIS — N390 Urinary tract infection, site not specified: Secondary | ICD-10-CM | POA: Diagnosis not present

## 2016-02-05 DIAGNOSIS — Z0000 Encounter for general adult medical examination without abnormal findings: Secondary | ICD-10-CM | POA: Diagnosis not present

## 2016-02-05 NOTE — Patient Outreach (Signed)
  [facility]) Care Management  [facility]  02/05/2016  Chad MamBobby W Holden 1934/08/21 [id number]  Subjective:    Objective:   Encounter Medications:  Outpatient Encounter Prescriptions as of 02/05/2016  Medication Sig  . dorzolamide-timolol (COSOPT) 22.3-6.8 MG/ML ophthalmic solution PLACE 1 DROP IN EACH EYE EVERY 12 HOURS  . ibuprofen (ADVIL,MOTRIN) 200 MG tablet Take 200 mg by mouth every 6 (six) hours as needed for moderate pain.  Marland Kitchen. latanoprost (XALATAN) 0.005 % ophthalmic solution Place 1 drop into both eyes at bedtime.   . tamsulosin (FLOMAX) 0.4 MG CAPS capsule Take 1 capsule (0.4 mg total) by mouth daily. Use for urinary retention.  . cefUROXime (CEFTIN) 500 MG tablet Take 1 tablet (500 mg total) by mouth 2 (two) times daily with a meal. (Patient not taking: Reported on 01/21/2016)  . phenazopyridine (PYRIDIUM) 100 MG tablet Take 1 tablet (100 mg total) by mouth 3 (three) times daily as needed (bladderspasm.). (Patient not taking: Reported on 01/21/2016)   No facility-administered encounter medications on file as of 02/05/2016.    Functional Status:  In your present state of health, do you have any difficulty performing the following activities: 02/05/2016 12/24/2015  Hearing? N N  Vision? N N  Difficulty concentrating or making decisions? Malvin JohnsY Y  Walking or climbing stairs? N Y  Dressing or bathing? N Y  Doing errands, shopping? N -  Preparing Food and eating ? N -  Using the Toilet? Y -  In the past six months, have you accidently leaked urine? Y -  Do you have problems with loss of bowel control? N -  Managing your Medications? Y -  Managing your Finances? Y -  Housekeeping or managing your Housekeeping? Y -    Fall/Depression Screening:  PHQ 2/9 Scores 02/05/2016 01/21/2016  PHQ - 2 Score 0 4  PHQ- 9 Score 1 6  Exception Documentation Other- indicate reason in comment box -    Assessment:   Plan:

## 2016-02-11 NOTE — Patient Outreach (Signed)
  [place] [facility]) Care Management  [facility] Social Work  02/11/2016  HARREL FERRONE 1934/02/25 [id number]  Subjective:  Patient with signs/symptoms concerning for depression.   Current Medications:  Current Outpatient Prescriptions  Medication Sig Dispense Refill  . cefUROXime (CEFTIN) 500 MG tablet Take 1 tablet (500 mg total) by mouth 2 (two) times daily with a meal. (Patient not taking: Reported on 01/21/2016) 16 tablet 0  . dorzolamide-timolol (COSOPT) 22.3-6.8 MG/ML ophthalmic solution PLACE 1 DROP IN EACH EYE EVERY 12 HOURS  11  . ibuprofen (ADVIL,MOTRIN) 200 MG tablet Take 200 mg by mouth every 6 (six) hours as needed for moderate pain.    Marland Kitchen latanoprost (XALATAN) 0.005 % ophthalmic solution Place 1 drop into both eyes at bedtime.     . phenazopyridine (PYRIDIUM) 100 MG tablet Take 1 tablet (100 mg total) by mouth 3 (three) times daily as needed (bladderspasm.). (Patient not taking: Reported on 01/21/2016) 30 tablet 0  . tamsulosin (FLOMAX) 0.4 MG CAPS capsule Take 1 capsule (0.4 mg total) by mouth daily. Use for urinary retention. 30 capsule 0   No current facility-administered medications for this visit.    Functional Status:  In your present state of health, do you have any difficulty performing the following activities: 02/05/2016 12/24/2015  Hearing? N N  Vision? N N  Difficulty concentrating or making decisions? Tempie Donning  Walking or climbing stairs? N Y  Dressing or bathing? N Y  Doing errands, shopping? N -  Preparing Food and eating ? N -  Using the Toilet? Y -  In the past six months, have you accidently leaked urine? Y -  Do you have problems with loss of bowel control? N -  Managing your Medications? Y -  Managing your Finances? Y -  Housekeeping or managing your Housekeeping? Y -    Fall/Depression Screening:  PHQ 2/9 Scores 02/05/2016 01/21/2016  PHQ - 2 Score 0 4  PHQ- 9 Score 1 6  Exception Documentation Other- indicate reason in comment box -     Assessment:  CSW met with patient and his wife in their home. Patient denies depression; stating he feels the recnet medical issues including recurrent UTI.  Patient appears to have withdrawn from activities he use to enjoy; as well as form paying bills - leaving that to his wife to manage. CSW completed depression screening with patient who minimizes the symptoms.  CSW encouraged patient to consider mental health assessment and treatment (RX, counseling, etc)- he is hesitant and feels his tearfulness and emotions are related to the medical/UTI.  Patient to be seen by urologist for follow up on UTI 6/22. CSW plans to follow up with patient as well as PCP regarding concerns related to his cognition and possible depression. Patient's wife appears more concerned than he and is encouraged to share at the PCP appointment as well.

## 2016-02-13 DIAGNOSIS — G9340 Encephalopathy, unspecified: Secondary | ICD-10-CM | POA: Diagnosis not present

## 2016-02-13 DIAGNOSIS — R319 Hematuria, unspecified: Secondary | ICD-10-CM | POA: Diagnosis not present

## 2016-02-13 DIAGNOSIS — E871 Hypo-osmolality and hyponatremia: Secondary | ICD-10-CM | POA: Diagnosis not present

## 2016-02-13 DIAGNOSIS — K4090 Unilateral inguinal hernia, without obstruction or gangrene, not specified as recurrent: Secondary | ICD-10-CM | POA: Diagnosis not present

## 2016-02-13 DIAGNOSIS — N390 Urinary tract infection, site not specified: Secondary | ICD-10-CM | POA: Diagnosis not present

## 2016-02-13 DIAGNOSIS — R339 Retention of urine, unspecified: Secondary | ICD-10-CM | POA: Diagnosis not present

## 2016-02-16 ENCOUNTER — Encounter: Payer: Self-pay | Admitting: *Deleted

## 2016-02-19 DIAGNOSIS — H26491 Other secondary cataract, right eye: Secondary | ICD-10-CM | POA: Diagnosis not present

## 2016-02-19 DIAGNOSIS — H4010X2 Unspecified open-angle glaucoma, moderate stage: Secondary | ICD-10-CM | POA: Diagnosis not present

## 2016-02-19 DIAGNOSIS — H5340 Unspecified visual field defects: Secondary | ICD-10-CM | POA: Diagnosis not present

## 2016-02-19 DIAGNOSIS — H35371 Puckering of macula, right eye: Secondary | ICD-10-CM | POA: Diagnosis not present

## 2016-03-09 ENCOUNTER — Other Ambulatory Visit: Payer: Self-pay | Admitting: *Deleted

## 2016-03-09 NOTE — Patient Outreach (Signed)
  [place] [facility]) Care Management  [facility] Social Work  03/09/2016  JAICEON COLLISTER [birth year] [id number]  Subjective:  CSW visited patient in his home for follow up (depression, community resource linking, etc)   Encounter Medications:  Outpatient Encounter Prescriptions as of 03/09/2016  Medication Sig  . cefUROXime (CEFTIN) 500 MG tablet Take 1 tablet (500 mg total) by mouth 2 (two) times daily with a meal.  . dorzolamide-timolol (COSOPT) 22.3-6.8 MG/ML ophthalmic solution PLACE 1 DROP IN EACH EYE EVERY 12 HOURS  . ibuprofen (ADVIL,MOTRIN) 200 MG tablet Take 200 mg by mouth every 6 (six) hours as needed for moderate pain.  . tamsulosin (FLOMAX) 0.4 MG CAPS capsule Take 1 capsule (0.4 mg total) by mouth daily. Use for urinary retention.  Marland Kitchen latanoprost (XALATAN) 0.005 % ophthalmic solution Place 1 drop into both eyes at bedtime.   . phenazopyridine (PYRIDIUM) 100 MG tablet Take 1 tablet (100 mg total) by mouth 3 (three) times daily as needed (bladderspasm.). (Patient not taking: Reported on 01/21/2016)   No facility-administered encounter medications on file as of 03/09/2016.    Functional Status:  In your present state of health, do you have any difficulty performing the following activities: 02/16/2016 02/05/2016  Hearing? N N  Vision? N N  Difficulty concentrating or making decisions? - Y  Walking or climbing stairs? - N  Dressing or bathing? - N  Doing errands, shopping? - N  Conservation officer, nature and eating ? - N  Using the Toilet? - Y  In the past six months, have you accidently leaked urine? - Y  Do you have problems with loss of bowel control? - N  Managing your Medications? - Y  Managing your Finances? - Y  Housekeeping or managing your Housekeeping? - Y    Fall/Depression Screening:  PHQ 2/9 Scores 02/05/2016 01/21/2016  PHQ - 2 Score 0 4  PHQ- 9 Score 1 6  Exception Documentation Other- indicate reason in comment box -    Assessment:    CSW met with patient and  his wife in their home today- patient reports,  " I don't need to see a counselor". He and wife report significant improvement with his cognition and signs of depression. Patient acknowledges that he is an "emotional" person but feels the hyper sensitive emotions have diminshed. He and wife shared with CSW that the UTI has now cleared and they feel much of his issues were related to that.   CSW talked with patient about aging and loss of abilities and encouraged him to work on positive self talk as well as "brain games" to help work his brian muscles.   Patient and wife shared a living will document with CSW but no HCPOA- will mail documents to them for completion with Notary.   Patient and wife appreciative of CSW suuport and visit- "you've helped me a lot".   Plan:  CSW will plan to follow up next month for further assessment as well as for Advance Directives.       [name], MSW, [place] Worker  [place] [phone]

## 2016-03-11 DIAGNOSIS — R4189 Other symptoms and signs involving cognitive functions and awareness: Secondary | ICD-10-CM | POA: Diagnosis not present

## 2016-03-11 DIAGNOSIS — N400 Enlarged prostate without lower urinary tract symptoms: Secondary | ICD-10-CM | POA: Diagnosis not present

## 2016-03-11 DIAGNOSIS — D72829 Elevated white blood cell count, unspecified: Secondary | ICD-10-CM | POA: Diagnosis not present

## 2016-03-11 DIAGNOSIS — E7800 Pure hypercholesterolemia, unspecified: Secondary | ICD-10-CM | POA: Diagnosis not present

## 2016-03-11 DIAGNOSIS — G9340 Encephalopathy, unspecified: Secondary | ICD-10-CM | POA: Diagnosis not present

## 2016-03-11 DIAGNOSIS — N390 Urinary tract infection, site not specified: Secondary | ICD-10-CM | POA: Diagnosis not present

## 2016-03-24 DIAGNOSIS — N3941 Urge incontinence: Secondary | ICD-10-CM | POA: Diagnosis not present

## 2016-03-30 ENCOUNTER — Other Ambulatory Visit: Payer: Self-pay | Admitting: *Deleted

## 2016-03-30 NOTE — Patient Outreach (Signed)
[place] [facility]) Care Management  [facility] Social Work  03/30/2016  Chad Holden 10-01-1933 [id number]  Subjective:  Patient celebrated his 82th birthday last week- reports he is doing well- started on a new drug  MYRBETRIQ 43m (5 week sample provided by his MD) for his urinary incontinence-  Denies any depression- admits to one fall in shower.     Encounter Medications:  Outpatient Encounter Prescriptions as of 03/30/2016  Medication Sig  . mirabegron ER (MYRBETRIQ) 25 MG TB24 tablet Take 25 mg by mouth daily. Once daily with dinner  . cefUROXime (CEFTIN) 500 MG tablet Take 1 tablet (500 mg total) by mouth 2 (two) times daily with a meal.  . dorzolamide-timolol (COSOPT) 22.3-6.8 MG/ML ophthalmic solution PLACE 1 DROP IN EACH EYE EVERY 12 HOURS  . ibuprofen (ADVIL,MOTRIN) 200 MG tablet Take 200 mg by mouth every 6 (six) hours as needed for moderate pain.  .Marland Kitchenlatanoprost (XALATAN) 0.005 % ophthalmic solution Place 1 drop into both eyes at bedtime.   . phenazopyridine (PYRIDIUM) 100 MG tablet Take 1 tablet (100 mg total) by mouth 3 (three) times daily as needed (bladderspasm.). (Patient not taking: Reported on 01/21/2016)  . tamsulosin (FLOMAX) 0.4 MG CAPS capsule Take 1 capsule (0.4 mg total) by mouth daily. Use for urinary retention.   No facility-administered encounter medications on file as of 03/30/2016.    Functional Status:  In your present state of health, do you have any difficulty performing the following activities: 02/16/2016 02/05/2016  Hearing? N N  Vision? N N  Difficulty concentrating or making decisions? - Y  Walking or climbing stairs? - N  Dressing or bathing? - N  Doing errands, shopping? - N  PConservation officer, natureand eating ? - N  Using the Toilet? - Y  In the past six months, have you accidently leaked urine? - Y  Do you have problems with loss of bowel control? - N  Managing your Medications? - Y  Managing your Finances? - Y  Housekeeping or managing your  Housekeeping? - Y         Assessment: CSW met with patient and his wife today- he was started on a new drug for his urinary incontinence-       MD provided him with a 5 week sample- he reports it is working and has not noticed any side effects aside from possible drowsiness- he is taking it a dinner. He also reports no signs or feelings of sadness or depression.  "I change the radio station if its a sad song like you told me to". He and his wife also report that he fell in the shower- slick tub floor and are encouraged to keep the skid mat in the tub.  Patient feels it was related to the slippery tub floor- denies any injury or lightheadness.   CSW provided patient and wife with some information on "brain games" and tips for dealing with short term memory loss- Patient shared with CSW that "Dr GInda Merlindid a test and I got 27 out of 30" on a mental exam.   Patient pleased with results.  CSW provided Advance Directive packet and will complete and have notarized as they desire- Wife provided copy of living will which I have scanned and will add to chart.   Plan:  CSW will plan to call in about one month for follow up and possible case closure at that time if he continues to do well.    JEduard Clos MSW, LCSW  [place] Worker  [place] [phone]

## 2016-04-12 ENCOUNTER — Encounter: Payer: Self-pay | Admitting: *Deleted

## 2016-04-26 DIAGNOSIS — R351 Nocturia: Secondary | ICD-10-CM | POA: Diagnosis not present

## 2016-04-26 DIAGNOSIS — N3941 Urge incontinence: Secondary | ICD-10-CM | POA: Diagnosis not present

## 2016-04-27 ENCOUNTER — Other Ambulatory Visit: Payer: Self-pay | Admitting: *Deleted

## 2016-04-27 NOTE — Patient Outreach (Signed)
[facility]) Care Management  04/27/2016  [name] 1934-02-11 [id number]   CSW spoke with patient and wife my phone- they are working to get the Advance Directives completed and notarized- she reports the bank told them they have to bring their own witnesses- CSW will look into community based opportunities for this-  Patient denies and depression- wife reports the same and "about the same as last time".  Patient shared with CSW that his Urologist increased his medication for the urinary incontinence on yesterday- updated Med list in EPIC.  Will plan followup call next week-     Reece LevyJanet Randle Shatzer, MSW, LCSW Clinical Social Worker  [facility] [phone]

## 2016-05-04 ENCOUNTER — Other Ambulatory Visit: Payer: Self-pay | Admitting: *Deleted

## 2016-05-04 NOTE — Patient Outreach (Signed)
[facility]) Care Management  05/04/2016  [name] 20-Jun-1934 [id number]   CSW spoke with patient and his wife who report he is doing well- he is having less incontinence per wife- They have   Walking at mall continues to be a weekly (most days) activity for them- "today he mowed part of the yard'.   CSW is working to help them find notary services as well witnesses to complete their Advance Directives as the bank told them they had to bring their own witnesses. CSW will research options and plan to follow  Up in the next few weeks.    Reece LevyJanet Journey Ratterman, MSW, LCSW Clinical Social Worker  [facility] [phone]

## 2016-05-21 ENCOUNTER — Other Ambulatory Visit: Payer: Self-pay | Admitting: *Deleted

## 2016-05-21 NOTE — Patient Outreach (Signed)
[facility]) Care Management  05/21/2016  [place] MamBobby W Perryman [birth year] [id number]    Will call again next week. CSW made an attempt to try and contact patient today for follow up.  HIPPA complaint message was left for patient on voicemail. Reece LevyJanet Briena Swingler, MSW, LCSW Clinical Social Worker  [facility] [phone]

## 2016-05-25 ENCOUNTER — Other Ambulatory Visit: Payer: Self-pay | Admitting: *Deleted

## 2016-05-25 NOTE — Patient Outreach (Signed)
[facility]) Care Management  05/25/2016  Chad Holden 19-Jan-1934 [id number]   Attempted to reach patient by phone today for follow up and update regarding his overall health/status and the Advance Directive paperwork. CSW will try again next week- Case closure is anticipated at that time.   Reece LevyJanet Mckenna Boruff, MSW, LCSW Clinical Social Worker  [facility] [phone]

## 2016-06-01 ENCOUNTER — Other Ambulatory Visit: Payer: Self-pay | Admitting: *Deleted

## 2016-06-03 DIAGNOSIS — N3941 Urge incontinence: Secondary | ICD-10-CM | POA: Diagnosis not present

## 2016-06-03 DIAGNOSIS — R351 Nocturia: Secondary | ICD-10-CM | POA: Diagnosis not present

## 2016-06-11 ENCOUNTER — Other Ambulatory Visit: Payer: Self-pay | Admitting: *Deleted

## 2016-06-11 NOTE — Patient Outreach (Signed)
[facility]) Care Management  06/11/2016  Chad Holden Oct 04, 1933 [id number]  CSW made an attempt to contact patient today for follow up.  A HIPAA complaint message was left for patient on voicemail. Will await follow up call or try again next week.    Reece LevyJanet Kimba Lottes, MSW, LCSW Clinical Social Worker  [facility] [phone]

## 2016-06-14 DIAGNOSIS — N400 Enlarged prostate without lower urinary tract symptoms: Secondary | ICD-10-CM | POA: Diagnosis not present

## 2016-06-14 DIAGNOSIS — R4189 Other symptoms and signs involving cognitive functions and awareness: Secondary | ICD-10-CM | POA: Diagnosis not present

## 2016-06-14 DIAGNOSIS — Z0000 Encounter for general adult medical examination without abnormal findings: Secondary | ICD-10-CM | POA: Diagnosis not present

## 2016-06-14 DIAGNOSIS — E7800 Pure hypercholesterolemia, unspecified: Secondary | ICD-10-CM | POA: Diagnosis not present

## 2016-06-14 DIAGNOSIS — Z1389 Encounter for screening for other disorder: Secondary | ICD-10-CM | POA: Diagnosis not present

## 2016-06-14 DIAGNOSIS — Z23 Encounter for immunization: Secondary | ICD-10-CM | POA: Diagnosis not present

## 2016-06-15 DIAGNOSIS — E7800 Pure hypercholesterolemia, unspecified: Secondary | ICD-10-CM | POA: Diagnosis not present

## 2016-06-15 DIAGNOSIS — R4189 Other symptoms and signs involving cognitive functions and awareness: Secondary | ICD-10-CM | POA: Diagnosis not present

## 2016-06-16 ENCOUNTER — Other Ambulatory Visit: Payer: Self-pay | Admitting: *Deleted

## 2016-06-16 ENCOUNTER — Encounter: Payer: Self-pay | Admitting: *Deleted

## 2016-06-18 ENCOUNTER — Other Ambulatory Visit: Payer: Self-pay | Admitting: *Deleted

## 2016-06-18 ENCOUNTER — Encounter: Payer: Self-pay | Admitting: *Deleted

## 2016-06-18 NOTE — Patient Outreach (Signed)
[facility]) Care Management  06/18/2016  [place] MamBobby W Butler 04-11-1934 [id number]   Spoke with wife who reports he is doing well- "mowed the yard today".  They have been educated and advise of options for notarizing documents and plan to follow up on this as soon as they can.   CSW discussed case closure with wife who agrees.   CS[facility] will [facility] and [facility].   Reece LevyJanet Kartier Bennison, MSW, LCSW Clinical Social Worker  [facility] [phone]

## 2016-07-08 DIAGNOSIS — R351 Nocturia: Secondary | ICD-10-CM | POA: Diagnosis not present

## 2016-07-08 DIAGNOSIS — N3941 Urge incontinence: Secondary | ICD-10-CM | POA: Diagnosis not present

## 2016-08-20 DIAGNOSIS — Z961 Presence of intraocular lens: Secondary | ICD-10-CM | POA: Diagnosis not present

## 2016-08-20 DIAGNOSIS — H01004 Unspecified blepharitis left upper eyelid: Secondary | ICD-10-CM | POA: Diagnosis not present

## 2016-08-20 DIAGNOSIS — H01001 Unspecified blepharitis right upper eyelid: Secondary | ICD-10-CM | POA: Diagnosis not present

## 2016-08-20 DIAGNOSIS — H4010X2 Unspecified open-angle glaucoma, moderate stage: Secondary | ICD-10-CM | POA: Diagnosis not present

## 2016-10-19 DIAGNOSIS — R351 Nocturia: Secondary | ICD-10-CM | POA: Diagnosis not present

## 2016-10-19 DIAGNOSIS — N3941 Urge incontinence: Secondary | ICD-10-CM | POA: Diagnosis not present

## 2017-02-18 DIAGNOSIS — H4010X2 Unspecified open-angle glaucoma, moderate stage: Secondary | ICD-10-CM | POA: Diagnosis not present

## 2017-02-18 DIAGNOSIS — H0011 Chalazion right upper eyelid: Secondary | ICD-10-CM | POA: Diagnosis not present

## 2017-02-18 DIAGNOSIS — Z961 Presence of intraocular lens: Secondary | ICD-10-CM | POA: Diagnosis not present

## 2017-02-18 DIAGNOSIS — H01001 Unspecified blepharitis right upper eyelid: Secondary | ICD-10-CM | POA: Diagnosis not present

## 2017-07-19 DIAGNOSIS — H409 Unspecified glaucoma: Secondary | ICD-10-CM | POA: Diagnosis not present

## 2017-07-19 DIAGNOSIS — Z23 Encounter for immunization: Secondary | ICD-10-CM | POA: Diagnosis not present

## 2017-07-19 DIAGNOSIS — E7800 Pure hypercholesterolemia, unspecified: Secondary | ICD-10-CM | POA: Diagnosis not present

## 2017-07-19 DIAGNOSIS — N400 Enlarged prostate without lower urinary tract symptoms: Secondary | ICD-10-CM | POA: Diagnosis not present

## 2017-07-19 DIAGNOSIS — R339 Retention of urine, unspecified: Secondary | ICD-10-CM | POA: Diagnosis not present

## 2017-07-19 DIAGNOSIS — F329 Major depressive disorder, single episode, unspecified: Secondary | ICD-10-CM | POA: Diagnosis not present

## 2017-07-20 DIAGNOSIS — F329 Major depressive disorder, single episode, unspecified: Secondary | ICD-10-CM | POA: Diagnosis not present

## 2017-07-20 DIAGNOSIS — I10 Essential (primary) hypertension: Secondary | ICD-10-CM | POA: Diagnosis not present

## 2017-07-20 DIAGNOSIS — N400 Enlarged prostate without lower urinary tract symptoms: Secondary | ICD-10-CM | POA: Diagnosis not present

## 2017-07-20 DIAGNOSIS — R339 Retention of urine, unspecified: Secondary | ICD-10-CM | POA: Diagnosis not present

## 2017-07-20 DIAGNOSIS — H409 Unspecified glaucoma: Secondary | ICD-10-CM | POA: Diagnosis not present

## 2017-07-20 DIAGNOSIS — E7800 Pure hypercholesterolemia, unspecified: Secondary | ICD-10-CM | POA: Diagnosis not present

## 2017-08-02 ENCOUNTER — Ambulatory Visit
Admission: RE | Admit: 2017-08-02 | Discharge: 2017-08-02 | Disposition: A | Discharge status: Home / Self Care | Payer: PPO | Source: Ambulatory Visit | Attending: Internal Medicine | Admitting: Internal Medicine

## 2017-08-02 ENCOUNTER — Other Ambulatory Visit: Payer: Self-pay | Admitting: Internal Medicine

## 2017-08-02 DIAGNOSIS — H409 Unspecified glaucoma: Secondary | ICD-10-CM | POA: Diagnosis not present

## 2017-08-02 DIAGNOSIS — Z23 Encounter for immunization: Secondary | ICD-10-CM | POA: Diagnosis not present

## 2017-08-02 DIAGNOSIS — R419 Unspecified symptoms and signs involving cognitive functions and awareness: Secondary | ICD-10-CM | POA: Diagnosis not present

## 2017-08-02 DIAGNOSIS — M545 Low back pain: Secondary | ICD-10-CM

## 2017-08-02 DIAGNOSIS — E7800 Pure hypercholesterolemia, unspecified: Secondary | ICD-10-CM | POA: Diagnosis not present

## 2017-08-02 DIAGNOSIS — K4090 Unilateral inguinal hernia, without obstruction or gangrene, not specified as recurrent: Secondary | ICD-10-CM | POA: Diagnosis not present

## 2017-08-02 DIAGNOSIS — Z1389 Encounter for screening for other disorder: Secondary | ICD-10-CM | POA: Diagnosis not present

## 2017-08-02 DIAGNOSIS — Z0000 Encounter for general adult medical examination without abnormal findings: Secondary | ICD-10-CM | POA: Diagnosis not present

## 2017-08-02 DIAGNOSIS — M5136 Other intervertebral disc degeneration, lumbar region: Secondary | ICD-10-CM | POA: Diagnosis not present

## 2017-08-02 DIAGNOSIS — H6120 Impacted cerumen, unspecified ear: Secondary | ICD-10-CM | POA: Diagnosis not present

## 2017-08-02 DIAGNOSIS — N400 Enlarged prostate without lower urinary tract symptoms: Secondary | ICD-10-CM | POA: Diagnosis not present

## 2017-08-02 DIAGNOSIS — F329 Major depressive disorder, single episode, unspecified: Secondary | ICD-10-CM | POA: Diagnosis not present

## 2017-08-02 DIAGNOSIS — H6121 Impacted cerumen, right ear: Secondary | ICD-10-CM | POA: Diagnosis not present

## 2017-08-02 DIAGNOSIS — R4189 Other symptoms and signs involving cognitive functions and awareness: Secondary | ICD-10-CM | POA: Diagnosis not present

## 2017-08-02 DIAGNOSIS — I499 Cardiac arrhythmia, unspecified: Secondary | ICD-10-CM | POA: Diagnosis not present

## 2017-08-02 DIAGNOSIS — G3184 Mild cognitive impairment, so stated: Secondary | ICD-10-CM | POA: Diagnosis not present

## 2017-08-18 DIAGNOSIS — H4010X2 Unspecified open-angle glaucoma, moderate stage: Secondary | ICD-10-CM | POA: Diagnosis not present

## 2017-09-01 DIAGNOSIS — H04123 Dry eye syndrome of bilateral lacrimal glands: Secondary | ICD-10-CM | POA: Diagnosis not present

## 2017-09-01 DIAGNOSIS — H4010X2 Unspecified open-angle glaucoma, moderate stage: Secondary | ICD-10-CM | POA: Diagnosis not present

## 2017-09-01 DIAGNOSIS — H35371 Puckering of macula, right eye: Secondary | ICD-10-CM | POA: Diagnosis not present

## 2017-12-21 DIAGNOSIS — N3941 Urge incontinence: Secondary | ICD-10-CM | POA: Diagnosis not present

## 2017-12-21 DIAGNOSIS — R351 Nocturia: Secondary | ICD-10-CM | POA: Diagnosis not present

## 2018-01-24 DIAGNOSIS — R351 Nocturia: Secondary | ICD-10-CM | POA: Diagnosis not present

## 2018-01-24 DIAGNOSIS — N3941 Urge incontinence: Secondary | ICD-10-CM | POA: Diagnosis not present

## 2018-01-30 ENCOUNTER — Observation Stay (HOSPITAL_COMMUNITY)
Admission: EM | Admit: 2018-01-30 | Discharge: 2018-02-01 | Disposition: A | Discharge status: Home / Self Care | Payer: PPO | Attending: Internal Medicine | Admitting: Internal Medicine

## 2018-01-30 ENCOUNTER — Emergency Department (HOSPITAL_COMMUNITY): Payer: PPO

## 2018-01-30 ENCOUNTER — Other Ambulatory Visit: Payer: Self-pay

## 2018-01-30 ENCOUNTER — Encounter (HOSPITAL_COMMUNITY): Payer: Self-pay | Admitting: Pharmacy Technician

## 2018-01-30 DIAGNOSIS — R413 Other amnesia: Secondary | ICD-10-CM | POA: Insufficient documentation

## 2018-01-30 DIAGNOSIS — Y998 Other external cause status: Secondary | ICD-10-CM | POA: Insufficient documentation

## 2018-01-30 DIAGNOSIS — Y93E1 Activity, personal bathing and showering: Secondary | ICD-10-CM | POA: Diagnosis not present

## 2018-01-30 DIAGNOSIS — R410 Disorientation, unspecified: Secondary | ICD-10-CM | POA: Diagnosis not present

## 2018-01-30 DIAGNOSIS — G459 Transient cerebral ischemic attack, unspecified: Secondary | ICD-10-CM | POA: Diagnosis present

## 2018-01-30 DIAGNOSIS — Y92012 Bathroom of single-family (private) house as the place of occurrence of the external cause: Secondary | ICD-10-CM | POA: Diagnosis not present

## 2018-01-30 DIAGNOSIS — Z79899 Other long term (current) drug therapy: Secondary | ICD-10-CM | POA: Diagnosis not present

## 2018-01-30 DIAGNOSIS — M6281 Muscle weakness (generalized): Secondary | ICD-10-CM | POA: Insufficient documentation

## 2018-01-30 DIAGNOSIS — W182XXA Fall in (into) shower or empty bathtub, initial encounter: Secondary | ICD-10-CM | POA: Insufficient documentation

## 2018-01-30 DIAGNOSIS — R531 Weakness: Secondary | ICD-10-CM | POA: Diagnosis not present

## 2018-01-30 DIAGNOSIS — R6889 Other general symptoms and signs: Secondary | ICD-10-CM | POA: Diagnosis not present

## 2018-01-30 DIAGNOSIS — R404 Transient alteration of awareness: Secondary | ICD-10-CM | POA: Diagnosis not present

## 2018-01-30 DIAGNOSIS — R29898 Other symptoms and signs involving the musculoskeletal system: Secondary | ICD-10-CM

## 2018-01-30 DIAGNOSIS — W19XXXA Unspecified fall, initial encounter: Secondary | ICD-10-CM | POA: Diagnosis not present

## 2018-01-30 DIAGNOSIS — N179 Acute kidney failure, unspecified: Secondary | ICD-10-CM | POA: Diagnosis present

## 2018-01-30 LAB — DIFFERENTIAL
Abs Immature Granulocytes: 0.1 10*3/uL (ref 0.0–0.1)
BASOS ABS: 0.1 10*3/uL (ref 0.0–0.1)
BASOS PCT: 1 %
EOS PCT: 2 %
Eosinophils Absolute: 0.2 10*3/uL (ref 0.0–0.7)
Immature Granulocytes: 2 %
LYMPHS PCT: 27 %
Lymphs Abs: 1.8 10*3/uL (ref 0.7–4.0)
MONO ABS: 0.8 10*3/uL (ref 0.1–1.0)
MONOS PCT: 12 %
NEUTROS PCT: 56 %
Neutro Abs: 3.8 10*3/uL (ref 1.7–7.7)

## 2018-01-30 LAB — COMPREHENSIVE METABOLIC PANEL
ALK PHOS: 78 U/L (ref 38–126)
ALT: 20 U/L (ref 17–63)
ANION GAP: 11 (ref 5–15)
AST: 23 U/L (ref 15–41)
Albumin: 3.6 g/dL (ref 3.5–5.0)
BUN: 21 mg/dL — AB (ref 6–20)
CALCIUM: 9 mg/dL (ref 8.9–10.3)
CO2: 20 mmol/L — AB (ref 22–32)
Chloride: 109 mmol/L (ref 101–111)
Creatinine, Ser: 1.33 mg/dL — ABNORMAL HIGH (ref 0.61–1.24)
GFR, EST AFRICAN AMERICAN: 56 mL/min — AB (ref >60)
GFR, EST NON AFRICAN AMERICAN: 48 mL/min — AB (ref >60)
Glucose, Bld: 142 mg/dL — ABNORMAL HIGH (ref 65–99)
Potassium: 3.9 mmol/L (ref 3.5–5.1)
SODIUM: 140 mmol/L (ref 135–145)
Total Bilirubin: 0.8 mg/dL (ref 0.3–1.2)
Total Protein: 6.5 g/dL (ref 6.5–8.1)

## 2018-01-30 LAB — CBC
HCT: 44.4 % (ref 39.0–52.0)
Hemoglobin: 15.1 g/dL (ref 13.0–17.0)
MCH: 31.5 pg (ref 26.0–34.0)
MCHC: 34 g/dL (ref 30.0–36.0)
MCV: 92.5 fL (ref 78.0–100.0)
Platelets: 142 10*3/uL — ABNORMAL LOW (ref 150–400)
RBC: 4.8 MIL/uL (ref 4.22–5.81)
RDW: 12.7 % (ref 11.5–15.5)
WBC: 6.7 10*3/uL (ref 4.0–10.5)

## 2018-01-30 LAB — PROTIME-INR
INR: 1.05
Prothrombin Time: 13.6 seconds (ref 11.4–15.2)

## 2018-01-30 LAB — APTT: APTT: 28 s (ref 24–36)

## 2018-01-30 NOTE — ED Provider Notes (Signed)
[facility] EMERGENCY DEPARTMENT Provider Note   CSN: [id number] Arrival date & time: 01/30/18  1856   An emergency department physician performed an initial assessment on this suspected stroke patient at 12.  History   Chief Complaint Chief Complaint  Patient presents with  . Code Stroke    HPI Chad Holden is a 84 y.o. male.  Level 5 caveat for urgent need for intervention.  Patient was in the shower when his wife heard a fall.  She was unable to help the patient out of the tub.  He initially complained of some confusion and right leg weakness.  Upon arrival to the emergency department, a code stroke was initiated.  However, at that time, he had a normal exam.  Patient was amnestic to the event.     Past Medical History:  Diagnosis Date  . Glaucoma   . Renal disorder     Patient Active Problem List   Diagnosis Date Noted  . UTI (lower urinary tract infection) 12/24/2015  . Acute encephalopathy 12/24/2015  . Leukocytosis 12/24/2015  . Urine retention 12/24/2015  . Confusion 12/24/2015    History reviewed. No pertinent surgical history.      Home Medications    Prior to Admission medications   Medication Sig Start Date End Date Taking? Authorizing Provider  dorzolamide-timolol (COSOPT) 22.3-6.8 MG/ML ophthalmic solution PLACE 1 DROP IN [facility] EYE EVERY 12 HOURS 10/23/15  Yes [provider]  ibuprofen (ADVIL,MOTRIN) 200 MG tablet Take 200 mg by mouth 2 (two) times daily.    Yes [provider]  latanoprost (XALATAN) 0.005 % ophthalmic solution Place 1 drop into both eyes at bedtime.  12/18/15  Yes [provider]  mirabegron ER (MYRBETRIQ) 50 MG TB24 tablet Take 50 mg by mouth every evening.  04/26/16  Yes [provider]  tamsulosin (FLOMAX) 0.4 MG CAPS capsule Take 1 capsule (0.4 mg total) by mouth daily. Use for urinary retention. Patient taking differently: Take 0.4 mg by mouth every evening. Use for urinary  retention. 12/27/15  Yes Mikhail, [place], DO  UNABLE TO FIND Stimulation therapy for the bladder: Once a week for 12 weeks @ [facility], [address] [place], [place], [place] [address], [phone] 01/24/18 03/13/19 Yes [provider]  cefUROXime (CEFTIN) 500 MG tablet Take 1 tablet (500 mg total) by mouth 2 (two) times daily with a meal. Patient not taking: Reported on 01/30/2018 12/27/15   Edsel Petrin, DO  phenazopyridine (PYRIDIUM) 100 MG tablet Take 1 tablet (100 mg total) by mouth 3 (three) times daily as needed (bladderspasm.). Patient not taking: Reported on 01/30/2018 12/27/15   Edsel Petrin, DO    Family History Family History  Problem Relation Age of Onset  . Mental illness Mother   . Cancer Father     Social History Social History   Tobacco Use  . Smoking status: Never Smoker  Substance Use Topics  . Alcohol use: No  . Drug use: Not on file     Allergies   Patient has no known allergies.   Review of Systems Review of Systems  Unable to perform ROS: Acuity of condition     Physical Exam Updated Vital Signs BP 115/83   Pulse 69   Temp 98.9 F (37.2 C)   Resp 18   Wt 99.3 kg (218 lb 14.7 oz)   SpO2 94%   BMI 31.41 kg/m   Physical Exam  Constitutional: He is oriented to person, place, and time. He  appears well-developed and well-nourished.  HENT:  Head: Normocephalic and atraumatic.  Eyes: Conjunctivae are normal.  Neck: Neck supple.  Cardiovascular: Normal rate and regular rhythm.  Pulmonary/Chest: Effort normal and breath sounds normal.  Abdominal: Soft. Bowel sounds are normal.  Musculoskeletal: Normal range of motion.  Neurological: He is alert and oriented to person, place, and time.  Motor, sensory, cerebellar exam all normal.  Skin: Skin is warm and dry.  Psychiatric: He has a normal mood and affect. His behavior is normal.  Nursing note and vitals reviewed.    ED Treatments / Results  Labs (all labs ordered are  listed, but only abnormal results are displayed) Labs Reviewed  CBC - Abnormal; Notable for the following components:      Result Value   Platelets 142 (*)    All other components within normal limits  COMPREHENSIVE METABOLIC PANEL - Abnormal; Notable for the following components:   CO2 20 (*)    Glucose, Bld 142 (*)    BUN 21 (*)    Creatinine, Ser 1.33 (*)    GFR calc non Af Amer 48 (*)    GFR calc Af Amer 56 (*)    All other components within normal limits  PROTIME-INR  APTT  DIFFERENTIAL  I-STAT TROPONIN, ED  I-STAT CHEM 8, ED  CBG MONITORING, ED    EKG None  Radiology Ct Head Code Stroke Wo Contrast  Result Date: 01/30/2018 CLINICAL DATA:  Code stroke. RIGHT lower extremity weakness and confusion. EXAM: CT HEAD WITHOUT CONTRAST TECHNIQUE: Contiguous axial images were obtained from the base of the skull through the vertex without intravenous contrast. COMPARISON:  MRI of the head December 24, 2015 FINDINGS: BRAIN: No intraparenchymal hemorrhage, mass effect nor midline shift. Moderate to severe ventriculomegaly. No hydrocephalus. Patchy supratentorial white matter hypodensities less than expected for patient's age, though non-specific are most compatible with chronic small vessel ischemic disease. No acute large vascular territory infarcts. No abnormal extra-axial fluid collections. Basal cisterns are patent. VASCULAR: Mild calcific atherosclerosis of the carotid siphons. SKULL: No skull fracture. Severe RIGHT temporomandibular osteoarthrosis. No significant scalp soft tissue swelling. SINUSES/ORBITS: Mild chronic RIGHT maxillary sinusitis.The included ocular globes and orbital contents are non-suspicious. Status post bilateral ocular lens implants. OTHER: Maxillary periapical abscess. ASPECTS [facility] Stroke Program Early CT Score) - Ganglionic level infarction (caudate, lentiform nuclei, internal capsule, insula, M1-M3 cortex): 7 - Supraganglionic infarction (M4-M6 cortex): 3 Total  score (0-10 with 10 being normal): 10 IMPRESSION: 1. No acute intracranial process. 2. ASPECTS is 10. 3. Moderate to severe parenchymal brain volume loss. 4. Mild chronic small vessel ischemic changes. 5. Critical Value/emergent results text paged to Dr.AROOR, Neurology via AMION secure system on 01/30/2018 at 6:54 pm, including interpreting physician's phone number. Electronically Signed   By: Awilda Metro M.D.   On: 01/30/2018 18:54    Procedures Procedures (including critical care time)  Medications Ordered in ED Medications - No data to display   Initial Impression / Assessment and Plan / ED Course  I have reviewed the triage vital signs and the nursing notes.  Pertinent labs & imaging results that were available during my care of the patient were reviewed by me and considered in my medical decision making (see chart for details).     Patient presents with a fall in the shower at home.  There is a questionable history of effusion and right leg weakness.  The symptoms have disappeared.  Code stroke was initiated.  Neurologist suggested an MRI and  an observation admission.  Final Clinical Impressions(s) / ED Diagnoses   Final diagnoses:  Fall, initial encounter  Right leg weakness    ED Discharge Orders    None       Donnetta Hutching, MD 01/30/18 2304

## 2018-01-30 NOTE — Consult Note (Signed)
Requesting Physician: Dr.Cook    Chief Complaint: R leg weakness.confusion  History obtained from: Patient and Chart     HPI:                                                                                                                                       Chad Holden is an 84 y.o. male with no significant history of vascular risk factors presents as a stroke alert after being fall in the shower. Found to have right leg weakness and confusion per EMS and stroke alerted.   Patient poor historian and does not recall events clearly. States he doesn't think he passed out, but felt his right leg gave out suddenly. He complained of knee pain to EMS but they noticed drift in the right leg, Also patient was not oriented to place or month. Confusion and weakness improved on the way. BP was 140 systolic.     Date last known well: 5.20.19 Time last known well: 7pm tPA Given: no, symptoms resolved NIHSS: 0 Baseline MRS 0    Past Medical History:  Diagnosis Date  . Glaucoma   . Renal disorder     History reviewed. No pertinent surgical history.  Family History  Problem Relation Age of Onset  . Mental illness Mother   . Cancer Father    Social History:  reports that he has never smoked. He does not have any smokeless tobacco history on file. He reports that he does not drink alcohol. His drug history is not on file.  Allergies: No Known Allergies  Medications:                                                                                                                        I reviewed home medications   ROS:  14 systems reviewed and negative except above    Examination:                                                                                                      General: Appears well-developed and well-nourished.  Psych: Affect  appropriate to situation Eyes: No scleral injection HENT: No OP obstrucion Head: Normocephalic.  Cardiovascular: Normal rate and regular rhythm.  Respiratory: Effort normal and breath sounds normal to anterior ascultation GI: Soft.  No distension. There is no tenderness.  Skin: WDI   Neurological Examination Mental Status: Alert, oriented, thought content appropriate.  Speech fluent without evidence of aphasia. Able to follow 3 step commands without difficulty. Cranial Nerves: II: Visual fields grossly normal,  III,IV, VI: ptosis not present, extra-ocular motions intact bilaterally, pupils equal, round, reactive to light and accommodation V,VII: smile symmetric, facial light touch sensation normal bilaterally VIII: hearing normal bilaterally IX,X: uvula rises symmetrically XI: bilateral shoulder shrug XII: midline tongue extension Motor: Right : Upper extremity   5/5    Left:     Upper extremity   5/5  Lower extremity   5/5     Lower extremity   5/5 Tone and bulk:normal tone throughout; no atrophy noted Sensory: Pinprick and light touch intact throughout, bilaterally Deep Tendon Reflexes: 2+ and symmetric throughout Plantars: Right: downgoing   Left: downgoing Cerebellar: normal finger-to-nose, normal rapid alternating movements and normal heel-to-shin test Gait: normal gait and station     Lab Results: Basic Metabolic Panel: Recent Labs  Lab 01/30/18 1844  NA 140  K 3.9  CL 109  CO2 20*  GLUCOSE 142*  BUN 21*  CREATININE 1.33*  CALCIUM 9.0    CBC: Recent Labs  Lab 01/30/18 1844  WBC 6.7  NEUTROABS 3.8  HGB 15.1  HCT 44.4  MCV 92.5  PLT 142*    Coagulation Studies: Recent Labs    01/30/18 1844  LABPROT 13.6  INR 1.05    Imaging: Ct Head Code Stroke Wo Contrast  Result Date: 01/30/2018 CLINICAL DATA:  Code stroke. RIGHT lower extremity weakness and confusion. EXAM: CT HEAD WITHOUT CONTRAST TECHNIQUE: Contiguous axial images were obtained from  the base of the skull through the vertex without intravenous contrast. COMPARISON:  MRI of the head December 24, 2015 FINDINGS: BRAIN: No intraparenchymal hemorrhage, mass effect nor midline shift. Moderate to severe ventriculomegaly. No hydrocephalus. Patchy supratentorial white matter hypodensities less than expected for patient's age, though non-specific are most compatible with chronic small vessel ischemic disease. No acute large vascular territory infarcts. No abnormal extra-axial fluid collections. Basal cisterns are patent. VASCULAR: Mild calcific atherosclerosis of the carotid siphons. SKULL: No skull fracture. Severe RIGHT temporomandibular osteoarthrosis. No significant scalp soft tissue swelling. SINUSES/ORBITS: Mild chronic RIGHT maxillary sinusitis.The included ocular globes and orbital contents are non-suspicious. Status post bilateral ocular lens implants. OTHER: Maxillary periapical abscess. ASPECTS [facility] Stroke Program Early CT Score) - Ganglionic level infarction (caudate, lentiform nuclei, internal capsule, insula, M1-M3 cortex): 7 - Supraganglionic infarction (M4-M6 cortex): 3 Total score (0-10 with 10 being normal): 10 IMPRESSION: 1.  No acute intracranial process. 2. ASPECTS is 10. 3. Moderate to severe parenchymal brain volume loss. 4. Mild chronic small vessel ischemic changes. 5. Critical Value/emergent results text paged to Dr.AROOR, Neurology via AMION secure system on 01/30/2018 at 6:54 pm, including interpreting physician's phone number. Electronically Signed   By: Awilda Metro M.D.   On: 01/30/2018 18:54     ASSESSMENT AND PLAN   84 y.o. male with no significant history of vascular risk factors presents as a stroke alert after being fall in the shower. Found to have right leg weakness and confusion.  TIA vs Syncope  Recommend #orthostatic vitals #EKG # MRI of the brain without contrast # Carotid dopplers  #Transthoracic Echo  # Start patient on ASA  daily #Start  or continue Atorvastatin 80 mg/other high intensity statin # BP goal: permissive HTN upto 220/110 systolic # HBAIC and Lipid profile # Telemetry monitoring # Frequent neuro checks # stroke swallow screen  Please page stroke NP  Or  PA  Or MD from 8am -4 pm  as this patient from this time will be  followed by the stroke.   You can look them up on [email or web address]  Password [facility]    Sushanth Aroor [facility] Pager Number [phone]

## 2018-01-30 NOTE — ED Triage Notes (Signed)
Pt arrives via EMS as a code stroke. Per EMS pt was in the shower and wife heard a fall. Pt wife was unable to help patient out of the tub. Pt normally able to walk without diffiiculty. Pt initially with confusion and R leg weakness. Upon arrival to the ED pt with no deficits. VSS with EMS. CCollar in place.

## 2018-01-31 ENCOUNTER — Observation Stay (HOSPITAL_BASED_OUTPATIENT_CLINIC_OR_DEPARTMENT_OTHER): Payer: PPO

## 2018-01-31 ENCOUNTER — Observation Stay (HOSPITAL_COMMUNITY): Payer: PPO

## 2018-01-31 ENCOUNTER — Encounter (HOSPITAL_COMMUNITY): Payer: Self-pay | Admitting: Internal Medicine

## 2018-01-31 DIAGNOSIS — G459 Transient cerebral ischemic attack, unspecified: Secondary | ICD-10-CM

## 2018-01-31 DIAGNOSIS — I351 Nonrheumatic aortic (valve) insufficiency: Secondary | ICD-10-CM

## 2018-01-31 DIAGNOSIS — N179 Acute kidney failure, unspecified: Secondary | ICD-10-CM | POA: Diagnosis not present

## 2018-01-31 DIAGNOSIS — R410 Disorientation, unspecified: Secondary | ICD-10-CM | POA: Diagnosis not present

## 2018-01-31 DIAGNOSIS — W19XXXA Unspecified fall, initial encounter: Secondary | ICD-10-CM

## 2018-01-31 LAB — COMPREHENSIVE METABOLIC PANEL
ALBUMIN: 3.3 g/dL — AB (ref 3.5–5.0)
ALK PHOS: 73 U/L (ref 38–126)
ALT: 16 U/L — AB (ref 17–63)
AST: 22 U/L (ref 15–41)
Anion gap: 9 (ref 5–15)
BUN: 19 mg/dL (ref 6–20)
CALCIUM: 8.4 mg/dL — AB (ref 8.9–10.3)
CO2: 21 mmol/L — AB (ref 22–32)
CREATININE: 1.09 mg/dL (ref 0.61–1.24)
Chloride: 109 mmol/L (ref 101–111)
GFR calc Af Amer: >60 mL/min (ref >60)
GFR calc non Af Amer: >60 mL/min (ref >60)
GLUCOSE: 109 mg/dL — AB (ref 65–99)
Potassium: 3.7 mmol/L (ref 3.5–5.1)
SODIUM: 139 mmol/L (ref 135–145)
TOTAL PROTEIN: 6.1 g/dL — AB (ref 6.5–8.1)
Total Bilirubin: 0.9 mg/dL (ref 0.3–1.2)

## 2018-01-31 LAB — CBC
HCT: 41.9 % (ref 39.0–52.0)
HEMOGLOBIN: 14.2 g/dL (ref 13.0–17.0)
MCH: 31.4 pg (ref 26.0–34.0)
MCHC: 33.9 g/dL (ref 30.0–36.0)
MCV: 92.7 fL (ref 78.0–100.0)
PLATELETS: 146 10*3/uL — AB (ref 150–400)
RBC: 4.52 MIL/uL (ref 4.22–5.81)
RDW: 12.6 % (ref 11.5–15.5)
WBC: 7.7 10*3/uL (ref 4.0–10.5)

## 2018-01-31 LAB — LIPID PANEL
CHOLESTEROL: 151 mg/dL (ref 0–200)
HDL: 26 mg/dL — AB (ref >40)
LDL Cholesterol: 98 mg/dL (ref 0–99)
TRIGLYCERIDES: 137 mg/dL (ref <150)
Total CHOL/HDL Ratio: 5.8 RATIO
VLDL: 27 mg/dL (ref 0–40)

## 2018-01-31 LAB — I-STAT CHEM 8, ED
BUN: 24 mg/dL — ABNORMAL HIGH (ref 6–20)
CALCIUM ION: 1.1 mmol/L — AB (ref 1.15–1.40)
CHLORIDE: 107 mmol/L (ref 101–111)
Creatinine, Ser: 1.2 mg/dL (ref 0.61–1.24)
Glucose, Bld: 141 mg/dL — ABNORMAL HIGH (ref 65–99)
HCT: 42 % (ref 39.0–52.0)
Hemoglobin: 14.3 g/dL (ref 13.0–17.0)
Potassium: 4.2 mmol/L (ref 3.5–5.1)
SODIUM: 140 mmol/L (ref 135–145)
TCO2: 23 mmol/L (ref 22–32)

## 2018-01-31 LAB — ECHOCARDIOGRAM COMPLETE: Weight: 3502.67 oz

## 2018-01-31 LAB — HEMOGLOBIN A1C
HEMOGLOBIN A1C: 5.3 % (ref 4.8–5.6)
Mean Plasma Glucose: 105.41 mg/dL

## 2018-01-31 LAB — I-STAT TROPONIN, ED: Troponin i, poc: 0 ng/mL (ref 0.00–0.08)

## 2018-01-31 MED ORDER — TAMSULOSIN HCL 0.4 MG PO CAPS
0.4000 mg | ORAL_CAPSULE | Freq: Every evening | ORAL | Status: DC
  Administered 2018-01-31: 0.4 mg via ORAL
  Filled 2018-01-31: qty 1

## 2018-01-31 MED ORDER — ENOXAPARIN SODIUM 40 MG/0.4ML ~~LOC~~ SOLN
40.0000 mg | SUBCUTANEOUS | Status: DC
  Administered 2018-01-31 – 2018-02-01 (×2): 40 mg via SUBCUTANEOUS
  Filled 2018-01-31 (×3): qty 0.4

## 2018-01-31 MED ORDER — ACETAMINOPHEN 650 MG RE SUPP: 650.0000 mg | RECTAL | Status: DC | PRN

## 2018-01-31 MED ORDER — ACETAMINOPHEN 160 MG/5ML PO SOLN: 650.0000 mg | ORAL | Status: DC | PRN

## 2018-01-31 MED ORDER — ACETAMINOPHEN 325 MG PO TABS: 650.0000 mg | ORAL_TABLET | ORAL | Status: DC | PRN

## 2018-01-31 MED ORDER — MIRABEGRON ER 25 MG PO TB24
50.0000 mg | ORAL_TABLET | Freq: Every evening | ORAL | Status: DC
  Administered 2018-01-31: 50 mg via ORAL
  Filled 2018-01-31: qty 2

## 2018-01-31 MED ORDER — LATANOPROST 0.005 % OP SOLN
1.0000 [drp] | Freq: Every day | OPHTHALMIC | Status: DC
  Administered 2018-02-01: 1 [drp] via OPHTHALMIC
  Filled 2018-01-31: qty 2.5

## 2018-01-31 MED ORDER — SODIUM CHLORIDE 0.9 % IV SOLN
INTRAVENOUS | Status: AC
  Administered 2018-01-31 (×2): via INTRAVENOUS

## 2018-01-31 MED ORDER — ASPIRIN 325 MG PO TABS
325.0000 mg | ORAL_TABLET | Freq: Every day | ORAL | Status: DC
  Administered 2018-01-31 – 2018-02-01 (×2): 325 mg via ORAL
  Filled 2018-01-31 (×2): qty 1

## 2018-01-31 MED ORDER — ASPIRIN 300 MG RE SUPP: 300.0000 mg | Freq: Every day | RECTAL | Status: DC

## 2018-01-31 MED ORDER — DORZOLAMIDE HCL-TIMOLOL MAL 2-0.5 % OP SOLN
1.0000 [drp] | Freq: Two times a day (BID) | OPHTHALMIC | Status: DC
  Administered 2018-01-31 – 2018-02-01 (×3): 1 [drp] via OPHTHALMIC
  Filled 2018-01-31 (×2): qty 10

## 2018-01-31 MED ORDER — ATORVASTATIN CALCIUM 80 MG PO TABS
80.0000 mg | ORAL_TABLET | Freq: Every day | ORAL | Status: DC
  Administered 2018-01-31: 80 mg via ORAL
  Filled 2018-01-31: qty 1

## 2018-01-31 MED ORDER — STROKE: EARLY STAGES OF RECOVERY BOOK
Freq: Once | Status: AC
  Administered 2018-01-31: 10:00:00
  Filled 2018-01-31: qty 1

## 2018-01-31 NOTE — ED Notes (Signed)
Pt taken to MRI  

## 2018-01-31 NOTE — H&P (Signed)
History and Physical    Chad AMEZCUA Holden:[id number] DOB: [birth year] DOA: 01/30/2018  PCP: Chad Noble, MD  Patient coming from: Home.  Chief Complaint: Loss of consciousness and right lower extremity weakness.  HPI: Chad Holden is a 84 y.o. male with history of urinary incontinence being followed by urologist was brought to the ER after patient had a syncopal episode while taking bath last night.  Patient states he suddenly lost consciousness and following which he was not able to get up from the bathtub.  Patient's wife had to call EMS.  EMS on arrival found the patient had some right lower extremity weakness.  And also initially appeared confused.  On the way to the ER patient's mentation improved.  Patient states he has been examined low back pain for which his primary care physician and had done x-rays and felt that he had degenerative disease.  ED Course: In the ER patient appeared nonfocal.  CT head was unremarkable EKG was showing normal sinus rhythm.  QTC was 432 ms.  On-call neurologist was consulted and patient is being admitted for further management of possible TIA versus syncope.  Patient denies any chest pain palpitation shortness of breath nausea vomiting headache abdominal pain diarrhea.  Is able to move all extremities without any difficulty.   Review of Systems: As per HPI, rest all negative.   Past Medical History:  Diagnosis Date  . Glaucoma   . Renal disorder     History reviewed. No pertinent surgical history.   reports that he has never smoked. He has never used smokeless tobacco. He reports that he does not drink alcohol. His drug history is not on file.  No Known Allergies  Family History  Problem Relation Age of Onset  . Mental illness Mother   . Cancer Father     Prior to Admission medications   Medication Sig Start Date End Date Taking? Authorizing Provider  dorzolamide-timolol (COSOPT) 22.3-6.8 MG/ML ophthalmic solution PLACE 1 DROP IN [facility]  EYE EVERY 12 HOURS 10/23/15  Yes [provider]  ibuprofen (ADVIL,MOTRIN) 200 MG tablet Take 200 mg by mouth 2 (two) times daily.    Yes [provider]  latanoprost (XALATAN) 0.005 % ophthalmic solution Place 1 drop into both eyes at bedtime.  12/18/15  Yes [provider]  mirabegron ER (MYRBETRIQ) 50 MG TB24 tablet Take 50 mg by mouth every evening.  04/26/16  Yes [provider]  tamsulosin (FLOMAX) 0.4 MG CAPS capsule Take 1 capsule (0.4 mg total) by mouth daily. Use for urinary retention. Patient taking differently: Take 0.4 mg by mouth every evening. Use for urinary retention. 12/27/15  Yes Mikhail, Arroy[place]ondo, DO  UNABLE TO FIND Stimulation therapy for the bladder: Once a week for 12 weeks @ [facility], [address] [place], [place], [place] [address], [phone] 01/24/18 03/13/19 Yes [provider]  cefUROXime (CEFTIN) 500 MG tablet Take 1 tablet (500 mg total) by mouth 2 (two) times daily with a meal. Patient not taking: Reported on 01/30/2018 12/27/15   Chad Petrin, DO  phenazopyridine (PYRIDIUM) 100 MG tablet Take 1 tablet (100 mg total) by mouth 3 (three) times daily as needed (bladderspasm.). Patient not taking: Reported on 01/30/2018 12/27/15   Chad Petrin, DO    Physical Exam: Vitals:   01/30/18 2239 01/30/18 2245 01/30/18 2330 01/31/18 0015  BP: 116/76 115/83 112/88 114/77  Pulse: 71 69 71 63  Resp: (!) 21  Temp:  TempSrc:      SpO2: 92% 94% (!) 87% 96%  Weight:          Constitutional: Moderately built and nourished. Vitals:   01/30/18 2239 01/30/18 2245 01/30/18 2330 01/31/18 0015  BP: 116/76 115/83 112/88 114/77  Pulse: 71 69 71 63  Resp: (!) 21  Temp:      TempSrc:      SpO2: 92% 94% (!) 87% 96%  Weight:       Eyes: Anicteric no pallor. ENMT: No discharge from the ears eyes nose or mouth. Neck: No mass palpated no neck rigidity.  No JVD appreciated. Respiratory: No rhonchi or  crepitations. Cardiovascular: S1-S2 heard no murmurs appreciated. Abdomen: Soft nontender bowel sounds present. Musculoskeletal: No edema.  No joint effusion. Skin: No rash.  Skin appears warm. Neurologic: Alert awake oriented to time place and person.  Moves all extremities 5 x 5.  No facial asymmetry tongue is midline.  Pupils are equal and reacting to light.  Tongue is midline. Psychiatric: Appears normal.  Normal affect.   Labs on Admission: I have personally reviewed following labs and imaging studies  CBC: Recent Labs  Lab 01/30/18 1844  WBC 6.7  NEUTROABS 3.8  HGB 15.1  HCT 44.4  MCV 92.5  PLT 142*   Basic Metabolic Panel: Recent Labs  Lab 01/30/18 1844  NA 140  K 3.9  CL 109  CO2 20*  GLUCOSE 142*  BUN 21*  CREATININE 1.33*  CALCIUM 9.0   GFR: CrCl cannot be calculated (Unknown ideal weight.). Liver Function Tests: Recent Labs  Lab 01/30/18 1844  AST 23  ALT 20  ALKPHOS 78  BILITOT 0.8  PROT 6.5  ALBUMIN 3.6   No results for input(s): LIPASE, AMYLASE in the last 168 hours. No results for input(s): AMMONIA in the last 168 hours. Coagulation Profile: Recent Labs  Lab 01/30/18 1844  INR 1.05   Cardiac Enzymes: No results for input(s): CKTOTAL, CKMB, CKMBINDEX, TROPONINI in the last 168 hours. BNP (last 3 results) No results for input(s): PROBNP in the last 8760 hours. HbA1C: No results for input(s): HGBA1C in the last 72 hours. CBG: No results for input(s): GLUCAP in the last 168 hours. Lipid Profile: No results for input(s): CHOL, HDL, LDLCALC, TRIG, CHOLHDL, LDLDIRECT in the last 72 hours. Thyroid Function Tests: No results for input(s): TSH, T4TOTAL, FREET4, T3FREE, THYROIDAB in the last 72 hours. Anemia Panel: No results for input(s): VITAMINB12, FOLATE, FERRITIN, TIBC, IRON, RETICCTPCT in the last 72 hours. Urine analysis:    Component Value Date/Time   COLORURINE AMBER (A) 12/24/2015 1514   APPEARANCEUR CLOUDY (A) 12/24/2015 1514    LABSPEC 1.023 12/24/2015 1514   PHURINE 6.0 12/24/2015 1514   GLUCOSEU NEGATIVE 12/24/2015 1514   HGBUR LARGE (A) 12/24/2015 1514   BILIRUBINUR NEGATIVE 12/24/2015 1514   KETONESUR NEGATIVE 12/24/2015 1514   PROTEINUR 100 (A) 12/24/2015 1514   NITRITE NEGATIVE 12/24/2015 1514   LEUKOCYTESUR MODERATE (A) 12/24/2015 1514   Sepsis Labs: (procalcitonin:4,lacticidven:4) )No results found for this or any previous visit (from the past 240 hour(s)).   Radiological Exams on Admission: Ct Head Code Stroke Wo Contrast  Result Date: 01/30/2018 CLINICAL DATA:  Code stroke. RIGHT lower extremity weakness and confusion. EXAM: CT HEAD WITHOUT CONTRAST TECHNIQUE: Contiguous axial images were obtained from the base of the skull through the vertex without intravenous contrast. COMPARISON:  MRI of the head December 24, 2015 FINDINGS: BRAIN: No intraparenchymal hemorrhage, mass effect nor midline  shift. Moderate to severe ventriculomegaly. No hydrocephalus. Patchy supratentorial white matter hypodensities less than expected for patient's age, though non-specific are most compatible with chronic small vessel ischemic disease. No acute large vascular territory infarcts. No abnormal extra-axial fluid collections. Basal cisterns are patent. VASCULAR: Mild calcific atherosclerosis of the carotid siphons. SKULL: No skull fracture. Severe RIGHT temporomandibular osteoarthrosis. No significant scalp soft tissue swelling. SINUSES/ORBITS: Mild chronic RIGHT maxillary sinusitis.The included ocular globes and orbital contents are non-suspicious. Status post bilateral ocular lens implants. OTHER: Maxillary periapical abscess. ASPECTS [facility] Stroke Program Early CT Score) - Ganglionic level infarction (caudate, lentiform nuclei, internal capsule, insula, M1-M3 cortex): 7 - Supraganglionic infarction (M4-M6 cortex): 3 Total score (0-10 with 10 being normal): 10 IMPRESSION: 1. No acute intracranial process. 2. ASPECTS is 10.  3. Moderate to severe parenchymal brain volume loss. 4. Mild chronic small vessel ischemic changes. 5. Critical Value/emergent results text paged to Dr.AROOR, Neurology via AMION secure system on 01/30/2018 at 6:54 pm, including interpreting physician's phone number. Electronically Signed   By: Awilda Metro M.D.   On: 01/30/2018 18:54    EKG: Independently reviewed.  Normal sinus rhythm QTc .  Assessment/Plan Active Problems:   TIA (transient ischemic attack)   ARF (acute renal failure) (HCC)    1. TIA versus syncope -appreciate neurology consult.  MRI of the brain 2D echo carotid Dopplers have been ordered.  Patient passed swallow.  Closely monitor in telemetry.  Aspirin and statin has been ordered.  Check hemoglobin A1c lipid panel neurochecks. 2. Acute renal failure -gently hydrate and closely follow metabolic panel.  If creatinine does not improve may need further work-up.  Creatinine in April 2017 was 0.8.  Will check UA. 3. History of urinary incontinence being followed by urologist.   DVT prophylaxis: Lovenox. Code Status: DO NOT INTUBATE. Family Communication: Patient's wife. Disposition Plan: Home. Consults called: Neurology. Admission status: Observation.   [name] MD [facility] Pager [phone].  If 7PM-7AM, please contact night-coverage [email or web address] Password [email or web address]  01/31/2018, 1:46 AM

## 2018-01-31 NOTE — Progress Notes (Signed)
NEURO HOSPITALIST PROGRESS NOTE     Subjective: Patient awake, alert, in chair, in NAD. Patient states that he feeling well, and would like to go home. Right knee still has pain, and his lower back has pain. Denies any shortness of breath, chest pain, dizziness, or headaches.  Exam: Vitals:   01/31/18 0845 01/31/18 1212  BP: 133/78 126/78  Pulse: 65 72  Resp: 16 17  Temp: 98 F (36.7 C) 98.1 F (36.7 C)  SpO2: 98% 96%    Physical Exam   HEENT-  Normocephalic, no lesions, without obvious abnormality.  Normal external eye and conjunctiva.   Lungs- no excessive working breathing.  Saturations within normal limits Extremities- Warm, dry and intact Musculoskeletal-right knee pain, lower back pain Skin-warm and dry    Neuro:  Mental Status: Alert, oriented.  Speech fluent without evidence of aphasia.  Able to follow 3 step commands without difficulty. Can tell me his perspective of what brought him to the hospital. Cranial Nerves: II: Visual fields grossly normal,  III,IV, VI: ptosis not present, extra-ocular motions intact bilaterally pupils equal, round, reactive to light and accommodation V,VII: smile symmetric, facial light touch sensation normal bilaterally VIII: hearing normal bilaterally IX,X: uvula rises symmetrically XI: bilateral shoulder shrug XII: midline tongue extension Motor: Right : Upper extremity   4/5    Left:     Upper extremity   4/5  Lower extremity   5/5     Lower extremity   5/5 Tone and bulk:normal tone throughout; no atrophy noted Sensory:  light touch intact throughout, bilaterally Deep Tendon Reflexes: 2+ and symmetric throughout Plantars: Right: downgoing   Left: downgoing Cerebellar: normal finger-to-nose, normal rapid alternating movements  Gait:  unsteady and normal station    Medications:  Scheduled: .  stroke: mapping our early stages of recovery book   Does not apply Once  . aspirin  300 mg Rectal Daily   Or   . aspirin  325 mg Oral Daily  . atorvastatin  80 mg Oral q1800  . dorzolamide-timolol  1 drop Both Eyes BID  . enoxaparin (LOVENOX) injection  40 mg Subcutaneous Q24H  . latanoprost  1 drop Both Eyes QHS  . mirabegron ER  50 mg Oral QPM  . tamsulosin  0.4 mg Oral QPM   Continuous: . sodium chloride 75 mL/hr at 01/31/18 4098   JXB:[id number] **OR** acetaminophen (TYLENOL) oral liquid 160 mg/5 mL **OR** acetaminophen  Pertinent Labs/Diagnostics:   Mr Brain Wo Contrast  Result Date: 01/31/2018 CLINICAL DATA:  Initial evaluation for acute confusion, right leg weakness. EXAM: MRI HEAD WITHOUT CONTRAST TECHNIQUE: Multiplanar, multiecho pulse sequences of the brain and surrounding structures were obtained without intravenous contrast. COMPARISON:  Prior CT from 01/30/2018 as well as previous brain MRI from 12/24/2015. FINDINGS: Brain: Moderate cerebral volume loss with mild chronic small vessel ischemic change. No abnormal foci of restricted diffusion to suggest acute or subacute ischemia. Gray-white matter differentiation maintained. No encephalomalacia to suggest chronic infarction. No foci of susceptibility artifact to suggest acute or chronic intracranial hemorrhage. No mass lesion, midline shift or mass effect. Ventricular prominence related to global parenchymal volume loss without hydrocephalus. No extra-axial fluid collection. Pituitary gland suprasellar region normal. Midline structures intact and normal. Vascular: Major intracranial vascular flow voids maintained. Skull and upper cervical spine: Craniocervical junction within normal limits. Visualized upper cervical spine normal. Bone marrow signal intensity  normal. No acute scalp soft tissue abnormality. Sinuses/Orbits: Globes and orbital soft tissues within normal limits. Patient status post lens extraction bilaterally. Scattered mucosal thickening within the ethmoidal air cells and maxillary sinuses. Paranasal sinuses otherwise clear. No  significant mastoid effusion. Inner ear structures normal. Other: None. IMPRESSION: 1. No acute intracranial abnormality. 2. Moderate cerebral atrophy with mild chronic small vessel ischemic disease. Electronically Signed   By: Rise Mu M.D.   On: 01/31/2018 02:11   Ct Head Code Stroke Wo Contrast  Result Date: 01/30/2018 CLINICAL DATA:  Code stroke. RIGHT lower extremity weakness and confusion. EXAM: CT HEAD WITHOUT CONTRAST TECHNIQUE: Contiguous axial images were obtained from the base of the skull through the vertex without intravenous contrast. COMPARISON:  MRI of the head December 24, 2015 FINDINGS: BRAIN: No intraparenchymal hemorrhage, mass effect nor midline shift. Moderate to severe ventriculomegaly. No hydrocephalus. Patchy supratentorial white matter hypodensities less than expected for patient's age, though non-specific are most compatible with chronic small vessel ischemic disease. No acute large vascular territory infarcts. No abnormal extra-axial fluid collections. Basal cisterns are patent. VASCULAR: Mild calcific atherosclerosis of the carotid siphons. SKULL: No skull fracture. Severe RIGHT temporomandibular osteoarthrosis. No significant scalp soft tissue swelling. SINUSES/ORBITS: Mild chronic RIGHT maxillary sinusitis.The included ocular globes and orbital contents are non-suspicious. Status post bilateral ocular lens implants. OTHER: Maxillary periapical abscess. ASPECTS [facility] Stroke Program Early CT Score) - Ganglionic level infarction (caudate, lentiform nuclei, internal capsule, insula, M1-M3 cortex): 7 - Supraganglionic infarction (M4-M6 cortex): 3 Total score (0-10 with 10 being normal): 10 IMPRESSION: 1. No acute intracranial process. 2. ASPECTS is 10. 3. Moderate to severe parenchymal brain volume loss. 4. Mild chronic small vessel ischemic changes. 5. Critical Value/emergent results text paged to Dr.Aundraya Dripps, Neurology via AMION secure system on 01/30/2018 at 6:54 pm,  including interpreting physician's phone number. Electronically Signed   By: Awilda Metro M.D.   On: 01/30/2018 18:54       Impression:  84 y.o. male with no significant history of vascular risk factors presents as a stroke alert after falling in the shower. Found to have right leg weakness and confusion.  Low suspicion this is a TIA. Likely syncope/mechanical fall.   --MRI-No acute intracranial abnormality and moderate cerebral atrophy with mild chronic small vessel ischemic disease. --CT head- showed no acute intracranial process. --ECHO:No cardiac source of emboli was indentified. --Carotid doppler: 1-39% stenosis in right and left ICA, bilateral vertebral arteries with antegrade flow --A1c: 5.3 --lipid panel LDL 98 , HDL is [address]      Valentina Lucks, NP-C [facility] [phone]    01/31/2018, 4:25 PM    NEUROHOSPITALIST ADDENDUM Seen and examined the patient today. I have reviewed the contents of history and physical exam as documented by PA/ARNP/Resident and agree with above documentation.  I have discussed and formulated the above plan as documented. Edits to the note have been made as needed.    Georgiana Spinner Breslyn Abdo MD [facility] [phone]   If 7pm to 7am, please call on call as listed on AMION.

## 2018-01-31 NOTE — Progress Notes (Signed)
Carotid artery duplex has been completed. 1-39% ICA stenosis bilaterally.   01/31/18 2:35 PM Olen Cordial RVT

## 2018-01-31 NOTE — Progress Notes (Signed)
PROGRESS NOTE  Brief Narrative: Chad Holden is an 84 y.o. male with a history of urinary incontinence, lumbar DJD, and glaucoma who presented to the ED after falling in the bathtub. He had a brief period of confusion afterward and was not able to say whether he passed out but per his wife he was unable to get up. EMS was called and reported right leg weakness on arrival. Mentation quickly cleared. No focal deficits were noted on ED exam. CT head unremarkable. Neurology was consulted and he was admitted earlier this morning for TIA work up vs. syncope evaluation. MRI subsequently showed moderate atrophy with mild chronic small vessel ischemic disease without acute infarct. Echo showed no LV dysfunction, wall motion abnormalities, or cardioembolic source. Carotid dopplers showed no significant stenosis. There have been no significant arrhythmias on telemetry. PT and OT consults are pending.  Subjective: Seen this morning in ED (was admitted early this morning by Dr. Toniann Fail), reported feeling a bit more diffusely weak than baseline, but no focal weakness. Wife states he is at mental baseline.  Objective: BP 126/78 (BP Location: Left Arm)   Pulse 72   Temp 98.1 F (36.7 C) (Oral)   Resp 17   Wt 99.3 kg (218 lb 14.7 oz)   SpO2 96%   BMI 31.41 kg/m   Gen: Elderly male in no distress Pulm: Clear and nonlabored on room air  CV: RRR, no murmur, no JVD, no edema GI: Soft, NT, ND, +BS  Neuro: Alert and oriented. No focal deficits. Speech fluent.  Echocardiogram 01/31/2018: - Left ventricle: The cavity size was normal. Systolic function was   vigorous. The estimated ejection fraction was in the range of 65%   to 70%. Wall motion was normal; there were no regional wall   motion abnormalities. There was no evidence of elevated   ventricular filling pressure by Doppler parameters. - Aortic valve: There was mild regurgitation. - Pulmonary arteries: Systolic pressure was mildly increased. PA  peak pressure: 38 mm Hg (S). Impressions: - No cardiac source of emboli was indentified.  Carotid U/S: Right Carotid: Velocities in the right ICA are consistent with a 1-39% stenosis. Left Carotid: Velocities in the left ICA are consistent with a 1-39% stenosis.  Assessment & Plan: Fall at home: Obscure history, so has been admitted for syncope evaluation and TIA work up. Work up thus far is reassuring. HbA1c 5.2%.  - Continue telemetry x24 hrs. - Pending PT/OT evaluations and neurology recommendations - LDL 98, started statin  AKI: Creatinine improved w/IVF's. No further work up planned  Tyrone Nine, MD 01/31/2018, 3:53 PM

## 2018-01-31 NOTE — Evaluation (Signed)
Physical Therapy Evaluation Patient Details Name: Chad Holden MRN: [id number] DOB: 1934/03/20 Today's Date: 01/31/2018   History of Present Illness  Chad Holden is an 84 y.o. male with a history of urinary incontinence, lumbar DJD, and glaucoma who was admitted after falling in the bathtub with question syncope and confusion.  MRI subsequently showed moderate atrophy with mild chronic small vessel ischemic disease without acute infarct.  Clinical Impression  Patient presents with decreased independence with mobility due to R LE weakness and impaired balance and awareness.  He will benefit from skilled PT in the acute setting to allow return home with family support and follow up HHPT.     Follow Up Recommendations Supervision/Assistance - 24 hour;Home health PT    Equipment Recommendations  Rolling walker with 5" wheels    Recommendations for Other Services       Precautions / Restrictions Precautions Precautions: Fall      Mobility  Bed Mobility               General bed mobility comments: up in chair  Transfers Overall transfer level: Needs assistance Equipment used: Rolling walker (2 wheeled) Transfers: Sit to/from Stand Sit to Stand: Min guard         General transfer comment: attempted without device, but pt reaching for walker to side to steady himself; assist for balance/safety  Ambulation/Gait Ambulation/Gait assistance: Min guard Ambulation Distance (Feet): 300 Feet Assistive device: Rolling walker (2 wheeled);None Gait Pattern/deviations: Step-through pattern;Decreased stride length;Trunk flexed     General Gait Details: initially with RW, pt with flexed posture and keeping walker too far forward, cues for inside walker with some improvement, then without device min guard for safety  Stairs Stairs: Yes Stairs assistance: Min guard;Min assist Stair Management: Two rails;Alternating pattern;Forwards Number of Stairs: 2 General stair comments:  R knee buckled with lowering from steps and min A for safety  Wheelchair Mobility    Modified Rankin (Stroke Patients Only) Modified Rankin (Stroke Patients Only) Pre-Morbid Rankin Score: Slight disability Modified Rankin: Moderately severe disability     Balance Overall balance assessment: Needs assistance   Sitting balance-Leahy Scale: Good       Standing balance-Leahy Scale: Fair                               Pertinent Vitals/Pain Pain Assessment: 0-10 Pain Location: lower back pain Pain Intervention(s): Monitored during session    Home Living Family/patient expects to be discharged to:: Private residence Living Arrangements: Spouse/significant other Available Help at Discharge: Family;Available 24 hours/day Type of Home: House Home Access: Stairs to enter Entrance Stairs-Rails: None Entrance Stairs-Number of Steps: 2 Home Layout: One level Home Equipment: Cane - single point      Prior Function Level of Independence: Independent         Comments: takes out garbage      Hand Dominance   Dominant Hand: Right    Extremity/Trunk Assessment   Upper Extremity Assessment Upper Extremity Assessment: Overall WFL for tasks assessed    Lower Extremity Assessment Lower Extremity Assessment: Generalized weakness    Cervical / Trunk Assessment Cervical / Trunk Assessment: Kyphotic  Communication   Communication: No difficulties  Cognition Arousal/Alertness: Awake/alert Behavior During Therapy: WFL for tasks assessed/performed Overall Cognitive Status: History of cognitive impairments - at baseline  General Comments: alert and oriented but previous limitations at baseline per SLP      General Comments General comments (skin integrity, edema, etc.): wife present and supportive    Exercises     Assessment/Plan    PT Assessment Patient needs continued PT services  PT Problem List Decreased  strength;Decreased mobility;Decreased activity tolerance;Decreased balance;Decreased knowledge of use of DME;Decreased safety awareness       PT Treatment Interventions DME instruction;Functional mobility training;Balance training;Patient/family education;Gait training;Therapeutic activities;Neuromuscular re-education;Stair training;Therapeutic exercise    PT Goals (Current goals can be found in the Care Plan section)  Acute Rehab PT Goals Patient Stated Goal: to return to independent PT Goal Formulation: With patient/family Time For Goal Achievement: 02/07/18 Potential to Achieve Goals: Good    Frequency Min 3X/week   Barriers to discharge        Co-evaluation               AM-PAC PT "6 Clicks" Daily Activity  Outcome Measure Difficulty turning over in bed (including adjusting bedclothes, sheets and blankets)?: A Lot Difficulty moving from lying on back to sitting on the side of the bed? : Unable Difficulty sitting down on and standing up from a chair with arms (e.g., wheelchair, bedside commode, etc,.)?: A Lot Help needed moving to and from a bed to chair (including a wheelchair)?: A Little Help needed walking in hospital room?: A Little Help needed climbing 3-5 steps with a railing? : A Little 6 Click Score: 14    End of Session Equipment Utilized During Treatment: Gait belt Activity Tolerance: Patient tolerated treatment well Patient left: with call bell/phone within reach;in chair;with family/visitor present;with chair alarm set   PT Visit Diagnosis: Other abnormalities of gait and mobility (R26.89);Muscle weakness (generalized) (M62.81);Other symptoms and signs involving the nervous system (R29.898)    Time: 1308-6578 PT Time Calculation (min) (ACUTE ONLY): 21 min   Charges:   PT Evaluation $PT Eval Moderate Complexity: 1 Mod     PT G CodesSheran Lawless, [place] [phone] 01/31/2018   Elray Mcgregor 01/31/2018, 5:15 PM

## 2018-01-31 NOTE — Progress Notes (Addendum)
SLP Cancellation Note  Patient Details Name: Chad Holden MRN: [id number] DOB: [birth year]   Cancelled treatment:       Reason Eval/Treat Not Completed: SLP screened, no needs identified, will sign off   Wife present and reports that pt is at cognitive baseline. Wife reports some cognitive slowing over the last 3 months but pt still able to complete ADLs etc. No acute cognitive deficits identified as result of fall. No further services indicated.    [name] 01/31/2018, 10:20 AM

## 2018-01-31 NOTE — ED Notes (Signed)
Pt returned from MRI °

## 2018-01-31 NOTE — ED Notes (Signed)
Report given to 3W RN

## 2018-01-31 NOTE — ED Notes (Signed)
Requested hospital bed from [facility] 

## 2018-01-31 NOTE — Progress Notes (Signed)
  Echocardiogram 2D Echocardiogram has been performed.  Chad Holden 01/31/2018, 2:23 PM

## 2018-01-31 NOTE — Care Management Obs Status (Signed)
MEDICARE OBSERVATION STATUS NOTIFICATION   Patient Details  Name: Chad Holden MRN: [id number] Date of Birth: 06-26-1934   Medicare Observation Status Notification Given:  Yes    Lawerance Sabal, RN 01/31/2018, 4:29 PM

## 2018-01-31 NOTE — ED Notes (Signed)
ED TO INPATIENT HANDOFF REPORT  Name/Age/Gender Chad Holden 84 y.o. male  Code Status    Code Status Orders  (From admission, onward)        Start     Ordered   01/31/18 0146  Limited resuscitation (code)  Continuous    Question Answer Comment  In the event of cardiac or respiratory ARREST: Initiate Code Blue, Call Rapid Response Yes   In the event of cardiac or respiratory ARREST: Perform CPR Yes   In the event of cardiac or respiratory ARREST: Perform Intubation/Mechanical Ventilation No   In the event of cardiac or respiratory ARREST: Use NIPPV/BiPAp only if indicated Yes   In the event of cardiac or respiratory ARREST: Administer ACLS medications if indicated Yes   In the event of cardiac or respiratory ARREST: Perform Defibrillation or Cardioversion if indicated Yes      01/31/18 0145    Code Status History    Date Active Date Inactive Code Status Order ID Comments User Context   12/24/2015 1938 12/27/2015 1654 Partial Code [id number]  Elmarie Shiley, MD Inpatient      Home/SNF/Other Home  Chief Complaint CODE STROKE   Level of Care/Admitting Diagnosis ED Disposition    ED Disposition Condition [place]: [place] [100100]  Level of Care: Telemetry [5]  Diagnosis: TIA (transient ischemic attack) [202334]  Admitting Physician: Rise Patience [phone]  Attending Physician: Rise Patience [[id number]]  PT Class (Do Not Modify): Observation [104]  PT Acc Code (Do Not Modify): Observation [10022]       Medical History Past Medical History:  Diagnosis Date  . Glaucoma   . Renal disorder     Allergies No Known Allergies  IV Location/Drains/Wounds Patient Lines/Drains/Airways Status   Active Line/Drains/Airways    Name:   Placement date:   Placement time:   Site:   Days:   Peripheral IV 01/30/18 Left Hand   01/30/18    -    Hand   1          Labs/Imaging Results for orders placed or performed  during the hospital encounter of 01/30/18 (from the past 48 hour(s))  Protime-INR     Status: None   Collection Time: 01/30/18  6:44 PM  Result Value Ref Range   Prothrombin Time 13.6 11.4 - 15.2 seconds   INR 1.05     Comment: Performed at [facility], [place] [address], [place], [place] [address]  APTT     Status: None   Collection Time: 01/30/18  6:44 PM  Result Value Ref Range   aPTT 28 24 - 36 seconds    Comment: Performed at [facility] [address], [place], [place] [address]  CBC     Status: Abnormal   Collection Time: 01/30/18  6:44 PM  Result Value Ref Range   WBC 6.7 4.0 - 10.5 K/uL   RBC 4.80 4.22 - 5.81 MIL/uL   Hemoglobin 15.1 13.0 - 17.0 g/dL   HCT 44.4 39.0 - 52.0 %   MCV 92.5 78.0 - 100.0 fL   MCH 31.5 26.0 - 34.0 pg   MCHC 34.0 30.0 - 36.0 g/dL   RDW 12.7 11.5 - 15.5 %   Platelets 142 (L) 150 - 400 K/uL    Comment: Performed at [facility], [place] [address] [place], [place] [address]  Differential     Status: None   Collection Time: 01/30/18  6:44 PM  Result Value Ref Range   Neutrophils Relative % 56 %   Neutro Abs 3.8 1.7 - 7.7 K/uL   Lymphocytes Relative 27 %   Lymphs Abs 1.8 0.7 - 4.0 K/uL   Monocytes Relative 12 %   Monocytes Absolute 0.8 0.1 - 1.0 K/uL   Eosinophils Relative 2 %   Eosinophils Absolute 0.2 0.0 - 0.7 K/uL   Basophils Relative 1 %   Basophils Absolute 0.1 0.0 - 0.1 K/uL   Immature Granulocytes 2 %   Abs Immature Granulocytes 0.1 0.0 - 0.1 K/uL    Comment: Performed at [facility] [address], [place], [place] [address]  Comprehensive metabolic panel     Status: Abnormal   Collection Time: 01/30/18  6:44 PM  Result Value Ref Range   Sodium 140 135 - 145 mmol/L   Potassium 3.9 3.5 - 5.1 mmol/L   Chloride 109 101 - 111 mmol/L   CO2 20 (L) 22 - 32 mmol/L   Glucose, Bld 142 (H) 65 - 99 mg/dL   BUN 21 (H) 6 - 20 mg/dL   Creatinine, Ser 1.33 (H) 0.61 - 1.24 mg/dL   Calcium 9.0 8.9 - 10.3 mg/dL   Total  Protein 6.5 6.5 - 8.1 g/dL   Albumin 3.6 3.5 - 5.0 g/dL   AST 23 15 - 41 U/L   ALT 20 17 - 63 U/L   Alkaline Phosphatase 78 38 - 126 U/L   Total Bilirubin 0.8 0.3 - 1.2 mg/dL   GFR calc non Af Amer 48 (L) >60 mL/min   GFR calc Af Amer 56 (L) >60 mL/min    Comment: (NOTE) The eGFR has been calculated using the CKD EPI equation. This calculation has not been validated in all clinical situations. eGFR's persistently <60 mL/min signify possible Chronic Kidney Disease.    Anion gap 11 5 - 15    Comment: Performed at [facility] [address] [place], [place] [address]  I-stat troponin, ED     Status: None   Collection Time: 01/30/18  6:47 PM  Result Value Ref Range   Troponin i, poc 0.00 0.00 - 0.08 ng/mL   Comment 3            Comment: Due to the release kinetics of cTnI, a negative result within the first hours of the onset of symptoms does not rule out myocardial infarction with certainty. If myocardial infarction is still suspected, repeat the test at appropriate intervals.   I-Stat Chem 8, ED     Status: Abnormal   Collection Time: 01/30/18  6:49 PM  Result Value Ref Range   Sodium 140 135 - 145 mmol/L   Potassium 4.2 3.5 - 5.1 mmol/L   Chloride 107 101 - 111 mmol/L   BUN 24 (H) 6 - 20 mg/dL   Creatinine, Ser 1.20 0.61 - 1.24 mg/dL   Glucose, Bld 141 (H) 65 - 99 mg/dL   Calcium, Ion 1.10 (L) 1.15 - 1.40 mmol/L   TCO2 23 22 - 32 mmol/L   Hemoglobin 14.3 13.0 - 17.0 g/dL   HCT 42.0 39.0 - 52.0 %  Hemoglobin A1c     Status: None   Collection Time: 01/31/18  2:44 AM  Result Value Ref Range   Hgb A1c MFr Bld 5.3 4.8 - 5.6 %    Comment: (NOTE) Pre diabetes:          5.7%-6.4% Diabetes:              >  6.4% Glycemic control for   <7.0% adults with diabetes    Mean Plasma Glucose 105.41 mg/dL    Comment: Performed at [facility] [address] [place], [place] [address]  Lipid panel     Status: Abnormal   Collection Time: 01/31/18  2:44 AM  Result Value  Ref Range   Cholesterol 151 0 - 200 mg/dL   Triglycerides 137 <150 mg/dL   HDL 26 (L) >40 mg/dL   Total CHOL/HDL Ratio 5.8 RATIO   VLDL 27 0 - 40 mg/dL   LDL Cholesterol 98 0 - 99 mg/dL    Comment:        Total Cholesterol/HDL:CHD Risk Coronary Heart Disease Risk Table                     Men   Women  1/2 Average Risk   3.4   3.3  Average Risk       5.0   4.4  2 X Average Risk   9.6   7.1  3 X Average Risk  23.4   11.0        Use the calculated Patient Ratio above and the CHD Risk Table to determine the patient's CHD Risk.        ATP III CLASSIFICATION (LDL):  <100     mg/dL   Optimal  100-129  mg/dL   Near or Above                    Optimal  130-159  mg/dL   Borderline  160-189  mg/dL   High  >190     mg/dL   Very High Performed at [facility] [address], [place], [place] [address]   Comprehensive metabolic panel     Status: Abnormal   Collection Time: 01/31/18  2:44 AM  Result Value Ref Range   Sodium 139 135 - 145 mmol/L   Potassium 3.7 3.5 - 5.1 mmol/L   Chloride 109 101 - 111 mmol/L   CO2 21 (L) 22 - 32 mmol/L   Glucose, Bld 109 (H) 65 - 99 mg/dL   BUN 19 6 - 20 mg/dL   Creatinine, Ser 1.09 0.61 - 1.24 mg/dL   Calcium 8.4 (L) 8.9 - 10.3 mg/dL   Total Protein 6.1 (L) 6.5 - 8.1 g/dL   Albumin 3.3 (L) 3.5 - 5.0 g/dL   AST 22 15 - 41 U/L   ALT 16 (L) 17 - 63 U/L   Alkaline Phosphatase 73 38 - 126 U/L   Total Bilirubin 0.9 0.3 - 1.2 mg/dL   GFR calc non Af Amer >60 >60 mL/min   GFR calc Af Amer >60 >60 mL/min    Comment: (NOTE) The eGFR has been calculated using the CKD EPI equation. This calculation has not been validated in all clinical situations. eGFR's persistently <60 mL/min signify possible Chronic Kidney Disease.    Anion gap 9 5 - 15    Comment: Performed at [facility] [address] [place] [address], [place] [address]  CBC     Status: Abnormal   Collection Time: 01/31/18  2:44 AM  Result Value Ref Range   WBC 7.7 4.0 - 10.5 K/uL    RBC 4.52 4.22 - 5.81 MIL/uL   Hemoglobin 14.2 13.0 - 17.0 g/dL   HCT 41.9 39.0 - 52.0 %   MCV 92.7 78.0 - 100.0 fL   MCH 31.4 26.0 - 34.0 pg  MCHC 33.9 30.0 - 36.0 g/dL   RDW 12.6 11.5 - 15.5 %   Platelets 146 (L) 150 - 400 K/uL    Comment: Performed at [facility], [place] [address], [place], [place] [address]   Mr Brain 45 Contrast  Result Date: 01/31/2018 CLINICAL DATA:  Initial evaluation for acute confusion, right leg weakness. EXAM: MRI HEAD WITHOUT CONTRAST TECHNIQUE: Multiplanar, multiecho pulse sequences of the brain and surrounding structures were obtained without intravenous contrast. COMPARISON:  Prior CT from 01/30/2018 as well as previous brain MRI from 12/24/2015. FINDINGS: Brain: Moderate cerebral volume loss with mild chronic small vessel ischemic change. No abnormal foci of restricted diffusion to suggest acute or subacute ischemia. Gray-white matter differentiation maintained. No encephalomalacia to suggest chronic infarction. No foci of susceptibility artifact to suggest acute or chronic intracranial hemorrhage. No mass lesion, midline shift or mass effect. Ventricular prominence related to global parenchymal volume loss without hydrocephalus. No extra-axial fluid collection. Pituitary gland suprasellar region normal. Midline structures intact and normal. Vascular: Major intracranial vascular flow voids maintained. Skull and upper cervical spine: Craniocervical junction within normal limits. Visualized upper cervical spine normal. Bone marrow signal intensity normal. No acute scalp soft tissue abnormality. Sinuses/Orbits: Globes and orbital soft tissues within normal limits. Patient status post lens extraction bilaterally. Scattered mucosal thickening within the ethmoidal air cells and maxillary sinuses. Paranasal sinuses otherwise clear. No significant mastoid effusion. Inner ear structures normal. Other: None. IMPRESSION: 1. No acute intracranial abnormality. 2. Moderate  cerebral atrophy with mild chronic small vessel ischemic disease. Electronically Signed   By: Jeannine Boga M.D.   On: 01/31/2018 02:11   Ct Head Code Stroke Wo Contrast  Result Date: 01/30/2018 CLINICAL DATA:  Code stroke. RIGHT lower extremity weakness and confusion. EXAM: CT HEAD WITHOUT CONTRAST TECHNIQUE: Contiguous axial images were obtained from the base of the skull through the vertex without intravenous contrast. COMPARISON:  MRI of the head December 24, 2015 FINDINGS: BRAIN: No intraparenchymal hemorrhage, mass effect nor midline shift. Moderate to severe ventriculomegaly. No hydrocephalus. Patchy supratentorial white matter hypodensities less than expected for patient's age, though non-specific are most compatible with chronic small vessel ischemic disease. No acute large vascular territory infarcts. No abnormal extra-axial fluid collections. Basal cisterns are patent. VASCULAR: Mild calcific atherosclerosis of the carotid siphons. SKULL: No skull fracture. Severe RIGHT temporomandibular osteoarthrosis. No significant scalp soft tissue swelling. SINUSES/ORBITS: Mild chronic RIGHT maxillary sinusitis.The included ocular globes and orbital contents are non-suspicious. Status post bilateral ocular lens implants. OTHER: Maxillary periapical abscess. ASPECTS [facility] Stroke Program Early CT Score) - Ganglionic level infarction (caudate, lentiform nuclei, internal capsule, insula, M1-M3 cortex): 7 - Supraganglionic infarction (M4-M6 cortex): 3 Total score (0-10 with 10 being normal): 10 IMPRESSION: 1. No acute intracranial process. 2. ASPECTS is 10. 3. Moderate to severe parenchymal brain volume loss. 4. Mild chronic small vessel ischemic changes. 5. Critical Value/emergent results text paged to [place], Neurology via AMION secure system on 01/30/2018 at 6:54 pm, including interpreting physician's phone number. Electronically Signed   By: Elon Alas M.D.   On: 01/30/2018 18:54    Pending  Labs Unresulted Labs (From admission, onward)   Start     Ordered   02/07/18 0500  Creatinine, serum  (enoxaparin (LOVENOX)    CrCl >/= 30 ml/min)  Weekly,   R    Comments:  while on enoxaparin therapy    01/31/18 0145   01/31/18 0643  Urinalysis, Routine w reflex microscopic  Once,   R  01/31/18 0642      Vitals/Pain Today's Vitals   01/31/18 0600 01/31/18 0700 01/31/18 0741 01/31/18 0800  BP: 108/84 117/78  134/89  Pulse: 61 67  79  Resp: (!) 21 18  (!) 22  Temp:      TempSrc:      SpO2: 94% 94%  94%  Weight:      PainSc:   0-No pain     Isolation Precautions No active isolations  Medications Medications  dorzolamide-timolol (COSOPT) 22.3-6.8 MG/ML ophthalmic solution 1 drop (1 drop Both Eyes Not Given 01/31/18 0200)  latanoprost (XALATAN) 0.005 % ophthalmic solution 1 drop (1 drop Both Eyes Not Given 01/31/18 0343)  mirabegron ER (MYRBETRIQ) tablet 50 mg (has no administration in time range)  tamsulosin (FLOMAX) capsule 0.4 mg (has no administration in time range)   stroke: mapping our early stages of recovery book (has no administration in time range)  0.9 %  sodium chloride infusion ( Intravenous New Bag/Given 01/31/18 0247)  acetaminophen (TYLENOL) tablet 650 mg (has no administration in time range)    Or  acetaminophen (TYLENOL) solution 650 mg (has no administration in time range)    Or  acetaminophen (TYLENOL) suppository 650 mg (has no administration in time range)  enoxaparin (LOVENOX) injection 40 mg (has no administration in time range)  aspirin suppository 300 mg (has no administration in time range)    Or  aspirin tablet 325 mg (has no administration in time range)  atorvastatin (LIPITOR) tablet 80 mg (has no administration in time range)    Mobility walks with person assist

## 2018-02-01 ENCOUNTER — Other Ambulatory Visit: Payer: Self-pay

## 2018-02-01 DIAGNOSIS — N179 Acute kidney failure, unspecified: Secondary | ICD-10-CM | POA: Diagnosis not present

## 2018-02-01 DIAGNOSIS — G459 Transient cerebral ischemic attack, unspecified: Secondary | ICD-10-CM | POA: Diagnosis not present

## 2018-02-01 DIAGNOSIS — W19XXXA Unspecified fall, initial encounter: Secondary | ICD-10-CM | POA: Diagnosis not present

## 2018-02-01 DIAGNOSIS — R29898 Other symptoms and signs involving the musculoskeletal system: Secondary | ICD-10-CM

## 2018-02-01 LAB — URINALYSIS, ROUTINE W REFLEX MICROSCOPIC
BILIRUBIN URINE: NEGATIVE
Glucose, UA: NEGATIVE mg/dL
Hgb urine dipstick: NEGATIVE
KETONES UR: NEGATIVE mg/dL
Leukocytes, UA: NEGATIVE
NITRITE: NEGATIVE
PH: 6 (ref 5.0–8.0)
Protein, ur: NEGATIVE mg/dL
SPECIFIC GRAVITY, URINE: 1.013 (ref 1.005–1.030)

## 2018-02-01 MED ORDER — ATORVASTATIN CALCIUM 40 MG PO TABS: 40.0000 mg | ORAL_TABLET | Freq: Every day | ORAL | 0 refills | Status: DC

## 2018-02-01 MED ORDER — ASPIRIN EC 81 MG PO TBEC: 81.0000 mg | DELAYED_RELEASE_TABLET | Freq: Every day | ORAL | 0 refills | Status: AC

## 2018-02-01 NOTE — Progress Notes (Signed)
Pt was slightly confused and pulled  out  His Iv his tele leads  but follow questioned approprietly. Per wife pt taught he was going home thus started pulling of iV. Per night RN pt was wobbling gait very unsteady. Will continue to monitor pt.

## 2018-02-01 NOTE — Progress Notes (Signed)
Pt discharge education instructions completed with pt and spouse at bedside; both voices understanding and questions answered. Pt IV and telemetry removed; pt discharge home with spouse to transport him home. Pt transported off unit via wheelchair with belongings along with 3 in 1 and DME wheelchair which was delivered to pt at bedside. Pt to pick up electronically sent prescription from preferred pharmacy on file. Dionne Bucy RN

## 2018-02-01 NOTE — Discharge Instructions (Signed)
Fall Prevention in Hospitals, Adult WHAT ARE SOME SAFETY TIPS FOR PREVENTING FALLS? If you or a loved one has to stay in the hospital, talk with the health care providers about the risk of falling. Find out which medicines or treatments can cause dizziness or affect balance. Make a plan with the health care providers to prevent falls. The plan may include these points:  Ask for help moving around, especially after surgery or when feeling unwell.  Have support available when getting up and moving around.  Wear nonskid footwear.  Use the safety straps on wheelchairs.  Ask for help to get objects that are out of reach.  Wear eyeglasses.  Remove all clutter from the floor and the sides of the bed.  Keep equipment and wires securely out of the way.  Keep the bed locked in the low position.  Keep the side rails up on the bed.  Keep the nurse call button within reach.  Keep the door open when no one else is in the room.  Have someone stay in the hospital with you or your loved one.  Ask for a bed alarm if you are not able to stay with your loved one who is at risk for getting up without help.  Ask if sleeping pills or other medicines that alter mental status are necessary. WHAT INCREASES THE RISK FOR FALLS? Certain conditions and treatments may increase a patient's risk for falls in a hospital. These include:  Being in an unfamiliar environment.  Being on bed rest.  Having surgery.  Taking certain medicines, such as sleeping pills.  Having tubes in place, such as IV lines or catheters. Additional risk factors for falls in a hospital include:  Having vision problems.  Having a change in thinking, feeling, or behavior (altered mental status).  Having trouble with balance.  Needing to use the toilet frequently.  Having fallen in the past three months.  Having low blood pressure when standing up quickly (orthostatic hypotension). WHAT DOES THE HOSPITAL STAFF DO TO HELP  PREVENT ME OR MY LOVED ONE FROM FALLING? Hospitals have systems in place to prevent falls and accidents. Talk with the hospital staff about:  Doing an assessment to discuss fall risks and create a personalized plan to prevent falls.  Checking in regularly to see if help is needed for moving around and to assess any changes in fall risk.  Knowing where the nurse call button is and how to use it. Use this to call a nursing care provider any time.  Keeping personal items within reach. This includes eyeglasses, phones, and other electronic devices.  Following general safety guidelines when moving around.  Keeping the area around the bed free from clutter.  Removing unnecessary equipment or tubes to reduce the risk of tripping.  Using safety equipment, such as:  Walkers, crutches, and other walking devices for support.  Safety rails on the bed.  Safety straps in the bed.  A bed that can be lowered and locked to prevent movement.  Handrails in the bathroom.  Nonskid socks and shoes.  Locking mechanisms to secure equipment in place.  Lifting and transfer equipment. WHAT CAN I DO TO HELP PREVENT A FALL?  Talk with health care providers about fall prevention.  Have a personalized fall prevention plan in place.  Do not try to move around if you feel off balance or ill.  Change position slowly.  Sit on the side of the bed before standing up.  Sit down and call   for help if you feel dizzy or unsteady when standing.  Keep the hospital room clear of clutter. WHEN SHOULD I ASK FOR HELP? Ask for help whenever you:  Are not sure if you are able to move around safely.  Feel dizzy or unsteady.  Are not comfortable helping your loved one move around or use the bathroom. If you or a loved one falls, tell the hospital staff. This is important. This information is not intended to replace advice given to you by your health care provider. Make sure you discuss any questions you have  with your health care provider. Document Released: 08/27/2000 Document Revised: 01/27/2016 Document Reviewed: 06/12/2015 Elsevier Interactive Patient Education  2017 [employer]  

## 2018-02-01 NOTE — Progress Notes (Signed)
Physical Therapy Treatment Patient Details Name: Chad Holden MRN: [id number] DOB: [birth year] Today's Date: 02/01/2018    History of Present Illness Chad Holden is an 84 y.o. male with a history of urinary incontinence, lumbar DJD, and glaucoma who was admitted after falling in the bathtub with question syncope and confusion.  MRI subsequently showed moderate atrophy with mild chronic small vessel ischemic disease without acute infarct.    PT Comments    Patient overall supervision/min guard for safety with OOB mobility. Current plan remains appropriate.    Follow Up Recommendations  Supervision/Assistance - 24 hour;Home health PT     Equipment Recommendations  Other (comment)(equipment delivered to room)    Recommendations for Other Services       Precautions / Restrictions Precautions Precautions: Fall Restrictions Weight Bearing Restrictions: No    Mobility  Bed Mobility Overal bed mobility: Modified Independent Bed Mobility: Supine to Sit              Transfers Overall transfer level: Needs assistance Equipment used: None Transfers: Sit to/from Stand Sit to Stand: Min guard         General transfer comment: min guard for safety; no physical assist needed  Ambulation/Gait Ambulation/Gait assistance: Min guard;Supervision Ambulation Distance (Feet): 220 Feet Assistive device: Straight cane Gait Pattern/deviations: Step-through pattern;Decreased stride length Gait velocity: decreased   General Gait Details: cues for sequencing of gait with use of AD; pt unsteady with directional changes and horizontal head turns but no LOB   Stairs   Stairs assistance: Min guard Stair Management: One rail Left;Step to pattern;Forwards;With cane Number of Stairs: 2 General stair comments: pt with good sequencing/technique with use of AD    Wheelchair Mobility    Modified Rankin (Stroke Patients Only)       Balance Overall balance assessment: Needs  assistance Sitting-balance support: No upper extremity supported;Feet supported Sitting balance-Leahy Scale: Good     Standing balance support: Bilateral upper extremity supported;During functional activity Standing balance-Leahy Scale: Fair Standing balance comment: pt able to static stand without UE support                            Cognition Arousal/Alertness: Awake/alert Behavior During Therapy: WFL for tasks assessed/performed Overall Cognitive Status: History of cognitive impairments - at baseline                                 General Comments: alert and oriented but previous limitations at baseline per SLP      Exercises      General Comments General comments (skin integrity, edema, etc.): wife present      Pertinent Vitals/Pain Pain Assessment: Faces Faces Pain Scale: Hurts little more Pain Location: R knee Pain Descriptors / Indicators: Discomfort Pain Intervention(s): Monitored during session;Repositioned    Home Living                      Prior Function            PT Goals (current goals can now be found in the care plan section) Acute Rehab PT Goals Patient Stated Goal: to return to independent Progress towards PT goals: Progressing toward goals    Frequency    Min 3X/week      PT Plan Current plan remains appropriate    Co-evaluation  AM-PAC PT "6 Clicks" Daily Activity  Outcome Measure  Difficulty turning over in bed (including adjusting bedclothes, sheets and blankets)?: None Difficulty moving from lying on back to sitting on the side of the bed? : A Little Difficulty sitting down on and standing up from a chair with arms (e.g., wheelchair, bedside commode, etc,.)?: Unable Help needed moving to and from a bed to chair (including a wheelchair)?: A Little Help needed walking in hospital room?: A Little Help needed climbing 3-5 steps with a railing? : A Little 6 Click Score: 17     End of Session Equipment Utilized During Treatment: Gait belt Activity Tolerance: Patient tolerated treatment well Patient left: with call bell/phone within reach;with family/visitor present;Other (comment)(pt sitting EOB) Nurse Communication: Mobility status PT Visit Diagnosis: Other abnormalities of gait and mobility (R26.89);Muscle weakness (generalized) (M62.81);Other symptoms and signs involving the nervous system (R29.898)     Time: 1425-1445 PT Time Calculation (min) (ACUTE ONLY): 20 min  Charges:  $Gait Training: 8-22 mins                    G Codes:       Erline Levine, PTA Pager: [phone]     Carolynne Edouard 02/01/2018, 2:51 PM

## 2018-02-01 NOTE — Care Management Note (Signed)
Case Management Note  Patient Details  Name: Chad Holden MRN: [id number] Date of Birth: 17-Apr-1934  Subjective/Objective:       Pt in with TIA. He is from home with spouse.             Action/Plan: Pt discharging home with orders for [facility] services. CM provided his wife choice of HH agencies and she selected [facility]. Lupita Leash with [facility] notified and accepted the referral. Pt with orders for walker and 3in1. James with [facility] notified and will deliver the equipment to the room. Wife to provide supervision at home and transportation to home.   Expected Discharge Date:  02/01/18               Expected Discharge Plan:  Home w Home Health Services  In-House Referral:     Discharge planning Services  CM Consult  Post Acute Care Choice:  Durable Medical Equipment, Home Health Choice offered to:  Patient, Spouse  DME Arranged:  3-N-1, Walker rolling DME Agency:  [facility]  HH Arranged:  PT, OT, NIV HH Agency:     Status of Service:  Completed, signed off  If discussed at Long Length of Stay Meetings, dates discussed:    Additional Comments:  Kermit Balo, RN 02/01/2018, 1:46 PM

## 2018-02-01 NOTE — Evaluation (Signed)
Occupational Therapy Evaluation Patient Details Name: Chad Holden MRN: [id number] DOB: May 21, 1934 Today's Date: 02/01/2018    History of Present Illness Chad Holden is an 84 y.o. male with a history of urinary incontinence, lumbar DJD, and glaucoma who was admitted after falling in the bathtub with question syncope and confusion.  MRI subsequently showed moderate atrophy with mild chronic small vessel ischemic disease without acute infarct.   Clinical Impression   PTA Pt independent in ADL and mobility. Pt is currently min guard assist for standing ADL - set up for seated ADL. Pt will benefit from skilled OT in the acute setting and afterwards at [facility] level.- Pt and wife in agreement for [facility] as it will focus on ADL AND IADL to maintain independence (can also perform falls assessment). Next session acutely should focus on tub transfer with DME. Pt WILL require DME for shower safety - as fall occurred in shower PTA.    Follow Up Recommendations  Home health OT;Supervision/Assistance - 24 hour    Equipment Recommendations  Tub/shower seat;3 in 1 bedside commode    Recommendations for Other Services       Precautions / Restrictions Precautions Precautions: Fall Restrictions Weight Bearing Restrictions: No      Mobility Bed Mobility Overal bed mobility: Needs Assistance Bed Mobility: Sit to Supine       Sit to supine: Supervision   General bed mobility comments: no assist needed  Transfers Overall transfer level: Needs assistance Equipment used: Rolling walker (2 wheeled) Transfers: Sit to/from Stand Sit to Stand: Min guard         General transfer comment: vc for safe hand placement    Balance Overall balance assessment: Needs assistance Sitting-balance support: No upper extremity supported;Feet supported Sitting balance-Leahy Scale: Good Sitting balance - Comments: dymanic seated balance performed during LB dressing/bathing   Standing balance support:  Bilateral upper extremity supported;During functional activity Standing balance-Leahy Scale: Fair Standing balance comment: depends on RW for stability                           ADL either performed or assessed with clinical judgement   ADL Overall ADL's : Needs assistance/impaired Eating/Feeding: Independent;Sitting   Grooming: Min guard;Standing Grooming Details (indicate cue type and reason): short periods of time Upper Body Bathing: Set up;Sitting   Lower Body Bathing: Set up;Sitting/lateral leans Lower Body Bathing Details (indicate cue type and reason): asisst for sit <>stand for safety Upper Body Dressing : Set up;Sitting   Lower Body Dressing: Minimal assistance;Sit to/from stand Lower Body Dressing Details (indicate cue type and reason): able to don/doff socks sitting on commode with  min guard, min A for sit <> stand with assist for clothing management Toilet Transfer: Min guard;Minimal assistance;RW;Ambulation;Comfort height toilet;Grab bars Toilet Transfer Details (indicate cue type and reason): Pt reliant on grab bars for assist with transfers as well as balance Toileting- Clothing Manipulation and Hygiene: Min guard;Minimal assistance;Sit to/from stand Toileting - Clothing Manipulation Details (indicate cue type and reason): grimace with rear peri care - Pt's wife says thats normal Tub/ Shower Transfer: Moderate assistance Tub/Shower Transfer Details (indicate cue type and reason): educated on shower safety - will require seated for bathing - will need to practice with HHOT and supervision  Functional mobility during ADLs: Min guard;Minimal assistance;Rolling walker       Vision Patient Visual Report: No change from baseline Vision Assessment?: No apparent visual deficits     Perception  Praxis      Pertinent Vitals/Pain Pain Assessment: 0-10 Pain Score: 7  Pain Location: R knee Pain Descriptors / Indicators: Discomfort Pain Intervention(s):  Monitored during session;Repositioned;Limited activity within patient's tolerance     Hand Dominance Right   Extremity/Trunk Assessment Upper Extremity Assessment Upper Extremity Assessment: Overall WFL for tasks assessed   Lower Extremity Assessment Lower Extremity Assessment: Defer to PT evaluation   Cervical / Trunk Assessment Cervical / Trunk Assessment: Kyphotic   Communication Communication Communication: No difficulties   Cognition Arousal/Alertness: Awake/alert Behavior During Therapy: WFL for tasks assessed/performed Overall Cognitive Status: History of cognitive impairments - at baseline                                 General Comments: alert and oriented but previous limitations at baseline per SLP   General Comments  wife present and supportive    Exercises     Shoulder Instructions      Home Living Family/patient expects to be discharged to:: Private residence Living Arrangements: Spouse/significant other Available Help at Discharge: Family;Available 24 hours/day Type of Home: House Home Access: Stairs to enter [employer] of Steps: 2 Entrance Stairs-Rails: None Home Layout: One level     Bathroom Shower/Tub: Chief Strategy Officer: Standard     Home Equipment: Cane - single point          Prior Functioning/Environment Level of Independence: Independent        Comments: takes out garbage, likes to walk around the mall daily, likes to lift weights        OT Problem List: Decreased activity tolerance;Impaired balance (sitting and/or standing);Decreased safety awareness;Decreased knowledge of use of DME or AE;Pain      OT Treatment/Interventions: Self-care/ADL training;DME and/or AE instruction;Therapeutic activities;Patient/family education;Balance training    OT Goals(Current goals can be found in the care plan section) Acute Rehab OT Goals Patient Stated Goal: to return to independent OT Goal  Formulation: With patient/family Time For Goal Achievement: 02/15/18 Potential to Achieve Goals: Good ADL Goals Pt Will Perform Grooming: with modified independence;standing Pt Will Transfer to Toilet: with modified independence;ambulating Pt Will Perform Toileting - Clothing Manipulation and hygiene: with modified independence;sit to/from stand Pt Will Perform Tub/Shower Transfer: Tub transfer;with min guard assist;with caregiver independent in assisting;ambulating;shower seat  OT Frequency: Min 3X/week   Barriers to D/C:            Co-evaluation              AM-PAC PT "6 Clicks" Daily Activity     Outcome Measure Help from another person eating meals?: None Help from another person taking care of personal grooming?: A Little Help from another person toileting, which includes using toliet, bedpan, or urinal?: A Little Help from another person bathing (including washing, rinsing, drying)?: A Little Help from another person to put on and taking off regular upper body clothing?: None Help from another person to put on and taking off regular lower body clothing?: A Little 6 Click Score: 20   End of Session Equipment Utilized During Treatment: Gait belt;Rolling walker Nurse Communication: Mobility status  Activity Tolerance: Patient tolerated treatment well Patient left: in bed;with call bell/phone within reach;with family/visitor present  OT Visit Diagnosis: Unsteadiness on feet (R26.81);Other abnormalities of gait and mobility (R26.89);History of falling (Z91.81);Pain Pain - Right/Left: Right Pain - part of body: Knee  Time: 1006-1030 OT Time Calculation (min): 24 min Charges:  OT General Charges $OT Visit: 1 Visit OT Evaluation $OT Eval Moderate Complexity: 1 Mod OT Treatments $Self Care/Home Management : 8-22 mins G-Codes:     Sherryl Manges OTR/L [phone]  Evern Bio Blakeley Scheier 02/01/2018, 12:20 PM

## 2018-02-01 NOTE — Discharge Summary (Signed)
Discharge Summary  Chad Holden:[id number] DOB: Jun 30, 1934  PCP: Marden Noble, MD  Admit date: 01/30/2018 Discharge date: 02/01/2018  Time spent: 25 minutes  Recommendations for Outpatient Follow-up:  1. Follow-up with PCP post hospitalization 2. Take your medications as prescribed 3. Fall precautions  Discharge Diagnoses:  Active Hospital Problems   Diagnosis Date Noted  . ARF (acute renal failure) (HCC) 01/31/2018  . TIA (transient ischemic attack) 01/30/2018    Resolved Hospital Problems  No resolved problems to display.    Discharge Condition: Stable  Diet recommendation: Previous diet  Vitals:   02/01/18 0839 02/01/18 1252  BP: 131/86 111/77  Pulse: 77 65  Resp: 20   Temp: 98.3 F (36.8 C) 98.3 F (36.8 C)  SpO2: 93% 94%    History of present illness:  Chad Holden is an 84 y.o. male with a history of urinary incontinence, lumbar DJD, and glaucoma who presented to the ED after falling in the bathtub. He had a brief period of confusion afterward and was not able to say whether he passed out but per his wife he was unable to get up. EMS was called and reported right leg weakness on arrival. Mentation quickly cleared. No focal deficits were noted on ED exam. CT head unremarkable. Neurology was consulted and he was admitted earlier this morning for TIA work up vs. syncope evaluation. MRI subsequently showed moderate atrophy with mild chronic small vessel ischemic disease without acute infarct. Echo showed no LV dysfunction, wall motion abnormalities, or cardioembolic source. Carotid dopplers showed no significant stenosis. There have been no significant arrhythmias on telemetry. PT and OT consults are pending.  02/01/2018: Patient seen and examined at his bedside.  His wife is in the room with him.  He inquires about going home.  Contacted neurology Dr. Laurence Slate, unlikely a TIA.  Neurology signed off and is okay with discharge.  On the day of discharge patient was  hemodynamically stable.  He will need to follow-up with his primary care provider post authorization.  Hospital Course:  Active Problems:   TIA (transient ischemic attack)   ARF (acute renal failure) (HCC)  Fall at home: Obscure history, so has been admitted for syncope evaluation and TIA work up. Work up thus far is reassuring. HbA1c 5.2%.  - Continue telemetry x24 hrs. EKG in sinus rhythm -MRI unremarkable for any acute intracranial findings -Bilateral carotid Doppler ultrasound unremarkable for significant stenosis - LDL 98, started statin  AKI: Creatinine improved w/IVF's. No further work up planned   Procedures:  None  Consultations:  Neurology  Discharge Exam: BP 111/77 (BP Location: Left Arm)   Pulse 65   Temp 98.3 F (36.8 C)   Resp 20   Ht 5' 10.5" (1.791 m)   Wt 98 kg (216 lb 0.8 oz)   SpO2 94%   BMI 30.56 kg/m  . General: 84 y.o. year-old male well developed well nourished in no acute distress.  Alert and oriented x3. . Cardiovascular: Regular rate and rhythm with no rubs or gallops.  No thyromegaly or JVD noted.   Marland Kitchen Respiratory: Clear to auscultation with no wheezes or rales. Good inspiratory effort. . Abdomen: Soft nontender nondistended with normal bowel sounds x4 quadrants. . Musculoskeletal: No lower extremity edema. 2/4 pulses in all 4 extremities. . Skin: No ulcerative lesions noted or rashes, . Psychiatry: Mood is appropriate for condition and setting  Discharge Instructions You were cared for by a hospitalist during your hospital stay. If you have any questions  about your discharge medications or the care you received while you were in the hospital after you are discharged, you can call the unit and asked to speak with the hospitalist on call if the hospitalist that took care of you is not available. Once you are discharged, your primary care physician will handle any further medical issues. Please note that NO REFILLS for any discharge medications  will be authorized once you are discharged, as it is imperative that you return to your primary care physician (or establish a relationship with a primary care physician if you do not have one) for your aftercare needs so that they can reassess your need for medications and monitor your lab values.   Allergies as of 02/01/2018   No Known Allergies     Medication List    STOP taking these medications   cefUROXime 500 MG tablet Commonly known as:  CEFTIN   ibuprofen 200 MG tablet Commonly known as:  ADVIL,MOTRIN   phenazopyridine 100 MG tablet Commonly known as:  PYRIDIUM   UNABLE TO FIND     TAKE these medications   aspirin EC 81 MG tablet Take 1 tablet (81 mg total) by mouth daily.   atorvastatin 40 MG tablet Commonly known as:  LIPITOR Take 1 tablet (40 mg total) by mouth daily.   dorzolamide-timolol 22.3-6.8 MG/ML ophthalmic solution Commonly known as:  COSOPT PLACE 1 DROP IN EACH EYE EVERY 12 HOURS   latanoprost 0.005 % ophthalmic solution Commonly known as:  XALATAN Place 1 drop into both eyes at bedtime.   mirabegron ER 50 MG Tb24 tablet Commonly known as:  MYRBETRIQ Take 50 mg by mouth every evening.   tamsulosin 0.4 MG Caps capsule Commonly known as:  FLOMAX Take 1 capsule (0.4 mg total) by mouth daily. Use for urinary retention. What changed:    when to take this  additional instructions      No Known Allergies Follow-up Information    Marden Noble, MD. Call in 1 day(s).   Specialty:  Internal Medicine Why:  call office to make appointment. Contact information: [address] [place] [address] [phone]            The results of significant diagnostics from this hospitalization (including imaging, microbiology, ancillary and laboratory) are listed below for reference.    Significant Diagnostic Studies: Mr Brain Wo Contrast  Result Date: 01/31/2018 CLINICAL DATA:  Initial evaluation for acute confusion, right leg  weakness. EXAM: MRI HEAD WITHOUT CONTRAST TECHNIQUE: Multiplanar, multiecho pulse sequences of the brain and surrounding structures were obtained without intravenous contrast. COMPARISON:  Prior CT from 01/30/2018 as well as previous brain MRI from 12/24/2015. FINDINGS: Brain: Moderate cerebral volume loss with mild chronic small vessel ischemic change. No abnormal foci of restricted diffusion to suggest acute or subacute ischemia. Gray-white matter differentiation maintained. No encephalomalacia to suggest chronic infarction. No foci of susceptibility artifact to suggest acute or chronic intracranial hemorrhage. No mass lesion, midline shift or mass effect. Ventricular prominence related to global parenchymal volume loss without hydrocephalus. No extra-axial fluid collection. Pituitary gland suprasellar region normal. Midline structures intact and normal. Vascular: Major intracranial vascular flow voids maintained. Skull and upper cervical spine: Craniocervical junction within normal limits. Visualized upper cervical spine normal. Bone marrow signal intensity normal. No acute scalp soft tissue abnormality. Sinuses/Orbits: Globes and orbital soft tissues within normal limits. Patient status post lens extraction bilaterally. Scattered mucosal thickening within the ethmoidal air cells and maxillary sinuses. Paranasal sinuses otherwise  clear. No significant mastoid effusion. Inner ear structures normal. Other: None. IMPRESSION: 1. No acute intracranial abnormality. 2. Moderate cerebral atrophy with mild chronic small vessel ischemic disease. Electronically Signed   By: Rise Mu M.D.   On: 01/31/2018 02:11   Ct Head Code Stroke Wo Contrast  Result Date: 01/30/2018 CLINICAL DATA:  Code stroke. RIGHT lower extremity weakness and confusion. EXAM: CT HEAD WITHOUT CONTRAST TECHNIQUE: Contiguous axial images were obtained from the base of the skull through the vertex without intravenous contrast. COMPARISON:   MRI of the head December 24, 2015 FINDINGS: BRAIN: No intraparenchymal hemorrhage, mass effect nor midline shift. Moderate to severe ventriculomegaly. No hydrocephalus. Patchy supratentorial white matter hypodensities less than expected for patient's age, though non-specific are most compatible with chronic small vessel ischemic disease. No acute large vascular territory infarcts. No abnormal extra-axial fluid collections. Basal cisterns are patent. VASCULAR: Mild calcific atherosclerosis of the carotid siphons. SKULL: No skull fracture. Severe RIGHT temporomandibular osteoarthrosis. No significant scalp soft tissue swelling. SINUSES/ORBITS: Mild chronic RIGHT maxillary sinusitis.The included ocular globes and orbital contents are non-suspicious. Status post bilateral ocular lens implants. OTHER: Maxillary periapical abscess. ASPECTS [facility] Stroke Program Early CT Score) - Ganglionic level infarction (caudate, lentiform nuclei, internal capsule, insula, M1-M3 cortex): 7 - Supraganglionic infarction (M4-M6 cortex): 3 Total score (0-10 with 10 being normal): 10 IMPRESSION: 1. No acute intracranial process. 2. ASPECTS is 10. 3. Moderate to severe parenchymal brain volume loss. 4. Mild chronic small vessel ischemic changes. 5. Critical Value/emergent results text paged to Dr.AROOR, Neurology via [employer] secure system on 01/30/2018 at 6:54 pm, including interpreting physician's phone number. Electronically Signed   By: Awilda Metro M.D.   On: 01/30/2018 18:54    Microbiology: No results found for this or any previous visit (from the past 240 hour(s)).   Labs: Basic Metabolic Panel: Recent Labs  Lab 01/30/18 1844 01/30/18 1849 01/31/18 0244  NA 140 140 139  K 3.9 4.2 3.7  CL 109 107 109  CO2 20*  --  21*  GLUCOSE 142* 141* 109*  BUN 21* 24* 19  CREATININE 1.33* 1.20 1.09  CALCIUM 9.0  --  8.4*   Liver Function Tests: Recent Labs  Lab 01/30/18 1844 01/31/18 0244  AST 23 22  ALT 20 16*  ALKPHOS  78 73  BILITOT 0.8 0.9  PROT 6.5 6.1*  ALBUMIN 3.6 3.3*   No results for input(s): LIPASE, AMYLASE in the last 168 hours. No results for input(s): AMMONIA in the last 168 hours. CBC: Recent Labs  Lab 01/30/18 1844 01/30/18 1849 01/31/18 0244  WBC 6.7  --  7.7  NEUTROABS 3.8  --   --   HGB 15.1 14.3 14.2  HCT 44.4 42.0 41.9  MCV 92.5  --  92.7  PLT 142*  --  146*   Cardiac Enzymes: No results for input(s): CKTOTAL, CKMB, CKMBINDEX, TROPONINI in the last 168 hours. BNP: BNP (last 3 results) No results for input(s): BNP in the last 8760 hours.  ProBNP (last 3 results) No results for input(s): PROBNP in the last 8760 hours.  CBG: No results for input(s): GLUCAP in the last 168 hours.     Signed:  Darlin Drop, MD [facility] 02/01/2018, 1:24 PM

## 2018-02-03 DIAGNOSIS — Z7982 Long term (current) use of aspirin: Secondary | ICD-10-CM | POA: Diagnosis not present

## 2018-02-03 DIAGNOSIS — Z8679 Personal history of other diseases of the circulatory system: Secondary | ICD-10-CM | POA: Diagnosis not present

## 2018-02-03 DIAGNOSIS — Z8673 Personal history of transient ischemic attack (TIA), and cerebral infarction without residual deficits: Secondary | ICD-10-CM | POA: Diagnosis not present

## 2018-02-03 DIAGNOSIS — R29898 Other symptoms and signs involving the musculoskeletal system: Secondary | ICD-10-CM | POA: Diagnosis not present

## 2018-02-03 DIAGNOSIS — M4306 Spondylolysis, lumbar region: Secondary | ICD-10-CM | POA: Diagnosis not present

## 2018-02-03 DIAGNOSIS — N179 Acute kidney failure, unspecified: Secondary | ICD-10-CM | POA: Diagnosis not present

## 2018-02-03 DIAGNOSIS — N289 Disorder of kidney and ureter, unspecified: Secondary | ICD-10-CM | POA: Diagnosis not present

## 2018-02-03 DIAGNOSIS — Z9181 History of falling: Secondary | ICD-10-CM | POA: Diagnosis not present

## 2018-02-07 DIAGNOSIS — N3941 Urge incontinence: Secondary | ICD-10-CM | POA: Diagnosis not present

## 2018-02-09 DIAGNOSIS — G459 Transient cerebral ischemic attack, unspecified: Secondary | ICD-10-CM | POA: Diagnosis not present

## 2018-02-10 DIAGNOSIS — Z961 Presence of intraocular lens: Secondary | ICD-10-CM | POA: Diagnosis not present

## 2018-02-10 DIAGNOSIS — H16103 Unspecified superficial keratitis, bilateral: Secondary | ICD-10-CM | POA: Diagnosis not present

## 2018-02-10 DIAGNOSIS — H04123 Dry eye syndrome of bilateral lacrimal glands: Secondary | ICD-10-CM | POA: Diagnosis not present

## 2018-02-10 DIAGNOSIS — H4010X2 Unspecified open-angle glaucoma, moderate stage: Secondary | ICD-10-CM | POA: Diagnosis not present

## 2018-02-14 DIAGNOSIS — N3941 Urge incontinence: Secondary | ICD-10-CM | POA: Diagnosis not present

## 2018-02-21 DIAGNOSIS — N3941 Urge incontinence: Secondary | ICD-10-CM | POA: Diagnosis not present

## 2018-02-28 DIAGNOSIS — N3941 Urge incontinence: Secondary | ICD-10-CM | POA: Diagnosis not present

## 2018-03-07 DIAGNOSIS — N3941 Urge incontinence: Secondary | ICD-10-CM | POA: Diagnosis not present

## 2018-03-14 DIAGNOSIS — N3941 Urge incontinence: Secondary | ICD-10-CM | POA: Diagnosis not present

## 2018-03-15 DIAGNOSIS — R29898 Other symptoms and signs involving the musculoskeletal system: Secondary | ICD-10-CM | POA: Diagnosis not present

## 2018-03-15 DIAGNOSIS — Z8673 Personal history of transient ischemic attack (TIA), and cerebral infarction without residual deficits: Secondary | ICD-10-CM | POA: Diagnosis not present

## 2018-03-15 DIAGNOSIS — N179 Acute kidney failure, unspecified: Secondary | ICD-10-CM | POA: Diagnosis not present

## 2018-03-15 DIAGNOSIS — M4306 Spondylolysis, lumbar region: Secondary | ICD-10-CM | POA: Diagnosis not present

## 2018-03-15 DIAGNOSIS — Z8679 Personal history of other diseases of the circulatory system: Secondary | ICD-10-CM | POA: Diagnosis not present

## 2018-03-15 DIAGNOSIS — Z9181 History of falling: Secondary | ICD-10-CM | POA: Diagnosis not present

## 2018-03-15 DIAGNOSIS — N289 Disorder of kidney and ureter, unspecified: Secondary | ICD-10-CM | POA: Diagnosis not present

## 2018-03-15 DIAGNOSIS — Z7982 Long term (current) use of aspirin: Secondary | ICD-10-CM | POA: Diagnosis not present

## 2018-03-21 DIAGNOSIS — N3941 Urge incontinence: Secondary | ICD-10-CM | POA: Diagnosis not present

## 2018-03-28 DIAGNOSIS — N3941 Urge incontinence: Secondary | ICD-10-CM | POA: Diagnosis not present

## 2018-04-04 DIAGNOSIS — N3941 Urge incontinence: Secondary | ICD-10-CM | POA: Diagnosis not present

## 2018-04-15 ENCOUNTER — Other Ambulatory Visit: Payer: Self-pay

## 2018-04-15 ENCOUNTER — Encounter (HOSPITAL_COMMUNITY): Payer: Self-pay

## 2018-04-15 ENCOUNTER — Emergency Department (HOSPITAL_COMMUNITY)
Admission: EM | Admit: 2018-04-15 | Discharge: 2018-04-16 | Disposition: A | Discharge status: Home / Self Care | Payer: PPO | Attending: Emergency Medicine | Admitting: Emergency Medicine

## 2018-04-15 ENCOUNTER — Emergency Department (HOSPITAL_COMMUNITY): Payer: PPO

## 2018-04-15 DIAGNOSIS — Z79899 Other long term (current) drug therapy: Secondary | ICD-10-CM | POA: Diagnosis not present

## 2018-04-15 DIAGNOSIS — R300 Dysuria: Secondary | ICD-10-CM | POA: Diagnosis not present

## 2018-04-15 DIAGNOSIS — Z7982 Long term (current) use of aspirin: Secondary | ICD-10-CM | POA: Diagnosis not present

## 2018-04-15 DIAGNOSIS — R339 Retention of urine, unspecified: Secondary | ICD-10-CM | POA: Diagnosis present

## 2018-04-15 DIAGNOSIS — N390 Urinary tract infection, site not specified: Secondary | ICD-10-CM | POA: Insufficient documentation

## 2018-04-15 DIAGNOSIS — Z8673 Personal history of transient ischemic attack (TIA), and cerebral infarction without residual deficits: Secondary | ICD-10-CM | POA: Diagnosis not present

## 2018-04-15 DIAGNOSIS — R3915 Urgency of urination: Secondary | ICD-10-CM

## 2018-04-15 DIAGNOSIS — R39198 Other difficulties with micturition: Secondary | ICD-10-CM | POA: Diagnosis not present

## 2018-04-15 DIAGNOSIS — R319 Hematuria, unspecified: Secondary | ICD-10-CM | POA: Diagnosis not present

## 2018-04-15 LAB — COMPREHENSIVE METABOLIC PANEL
ALBUMIN: 4.2 g/dL (ref 3.5–5.0)
ALK PHOS: 90 U/L (ref 38–126)
ALT: 28 U/L (ref 0–44)
AST: 23 U/L (ref 15–41)
Anion gap: 10 (ref 5–15)
BILIRUBIN TOTAL: 1.1 mg/dL (ref 0.3–1.2)
BUN: 15 mg/dL (ref 8–23)
CALCIUM: 9.3 mg/dL (ref 8.9–10.3)
CO2: 25 mmol/L (ref 22–32)
CREATININE: 1.09 mg/dL (ref 0.61–1.24)
Chloride: 104 mmol/L (ref 98–111)
Glucose, Bld: 114 mg/dL — ABNORMAL HIGH (ref 70–99)
Potassium: 4.2 mmol/L (ref 3.5–5.1)
SODIUM: 139 mmol/L (ref 135–145)
TOTAL PROTEIN: 7.5 g/dL (ref 6.5–8.1)

## 2018-04-15 LAB — CBC WITH DIFFERENTIAL/PLATELET
BASOS PCT: 0 %
Basophils Absolute: 0 10*3/uL (ref 0.0–0.1)
EOS ABS: 0 10*3/uL (ref 0.0–0.7)
EOS PCT: 0 %
HCT: 45.1 % (ref 39.0–52.0)
Hemoglobin: 15.4 g/dL (ref 13.0–17.0)
Lymphocytes Relative: 12 %
Lymphs Abs: 1.5 10*3/uL (ref 0.7–4.0)
MCH: 32.7 pg (ref 26.0–34.0)
MCHC: 34.1 g/dL (ref 30.0–36.0)
MCV: 95.8 fL (ref 78.0–100.0)
Monocytes Absolute: 1.1 10*3/uL — ABNORMAL HIGH (ref 0.1–1.0)
Monocytes Relative: 8 %
NEUTROS PCT: 80 %
Neutro Abs: 10.2 10*3/uL — ABNORMAL HIGH (ref 1.7–7.7)
PLATELETS: 178 10*3/uL (ref 150–400)
RBC: 4.71 MIL/uL (ref 4.22–5.81)
RDW: 13.1 % (ref 11.5–15.5)
WBC: 12.9 10*3/uL — AB (ref 4.0–10.5)

## 2018-04-15 LAB — I-STAT CG4 LACTIC ACID, ED: Lactic Acid, Venous: 1.16 mmol/L (ref 0.5–1.9)

## 2018-04-15 MED ORDER — ACETAMINOPHEN 500 MG PO TABS
1000.0000 mg | ORAL_TABLET | Freq: Once | ORAL | Status: AC
  Administered 2018-04-16: 1000 mg via ORAL
  Filled 2018-04-15: qty 2

## 2018-04-15 MED ORDER — SODIUM CHLORIDE 0.9 % IV BOLUS: 1000.0000 mL | Freq: Once | INTRAVENOUS | Status: DC

## 2018-04-15 NOTE — ED Notes (Signed)
Pt has several episodes of incontinence of urine

## 2018-04-15 NOTE — ED Triage Notes (Signed)
He states that, since lunch time today he has been unable to urinate. His wife tells me that his [facility] urologist is transitioning him from [facility]. Pt. Is in no distress.

## 2018-04-15 NOTE — ED Provider Notes (Signed)
[facility]-EMERGENCY DEPT Provider Note   CSN: [id number] Arrival date & time: 04/15/18  1748     History   Chief Complaint Chief Complaint  Patient presents with  . Urinary Retention    HPI Chad Holden is a 84 y.o. male.  84 yo M with a chief complaint of increased frequency hesitancy and urgency.  This been going on since this morning.  He has been feeling like he needs to PE but does not think he can.  Has been incontinent a couple times today as well.  No noted fevers at home but was febrile here.  Denies abdominal pain or flank pain.  Denies confusion or weakness.  The history is provided by the patient and the spouse.  Illness  This is a new problem. The current episode started yesterday. The problem occurs constantly. The problem has not changed since onset.Pertinent negatives include no chest pain, no abdominal pain, no headaches and no shortness of breath. Nothing aggravates the symptoms. Nothing relieves the symptoms. He has tried nothing for the symptoms. The treatment provided no relief.    Past Medical History:  Diagnosis Date  . Glaucoma   . Renal disorder     Patient Active Problem List   Diagnosis Date Noted  . ARF (acute renal failure) (HCC) 01/31/2018  . TIA (transient ischemic attack) 01/30/2018  . UTI (lower urinary tract infection) 12/24/2015  . Acute encephalopathy 12/24/2015  . Leukocytosis 12/24/2015  . Urine retention 12/24/2015  . Confusion 12/24/2015    No past surgical history on file.      Home Medications    Prior to Admission medications   Medication Sig Start Date End Date Taking? Authorizing Provider  aspirin EC 81 MG tablet Take 1 tablet (81 mg total) by mouth daily. 02/01/18 02/01/19  Darlin DropHall, Carole N, DO  atorvastatin (LIPITOR) 40 MG tablet Take 1 tablet (40 mg total) by mouth daily. 02/01/18 02/01/19  Darlin DropHall, Carole N, DO  dorzolamide-timolol (COSOPT) 22.3-6.8 MG/ML ophthalmic solution PLACE 1 DROP IN [facility] EYE  EVERY 12 HOURS 10/23/15   [provider]  latanoprost (XALATAN) 0.005 % ophthalmic solution Place 1 drop into both eyes at bedtime.  12/18/15   [provider]  mirabegron ER (MYRBETRIQ) 50 MG TB24 tablet Take 50 mg by mouth every evening.  04/26/16   [provider]  tamsulosin (FLOMAX) 0.4 MG CAPS capsule Take 1 capsule (0.4 mg total) by mouth daily. Use for urinary retention. Patient taking differently: Take 0.4 mg by mouth every evening. Use for urinary retention. 12/27/15   Edsel PetrinMikhail, Maryann, DO    Family History Family History  Problem Relation Age of Onset  . Mental illness Mother   . Cancer Father     Social History Social History   Tobacco Use  . Smoking status: Never Smoker  . Smokeless tobacco: Never Used  Substance Use Topics  . Alcohol use: No  . Drug use: Not on file     Allergies   Patient has no known allergies.   Review of Systems Review of Systems  Constitutional: Negative for chills and fever.  HENT: Negative for congestion and facial swelling.   Eyes: Negative for discharge and visual disturbance.  Respiratory: Negative for shortness of breath.   Cardiovascular: Negative for chest pain and palpitations.  Gastrointestinal: Negative for abdominal pain, diarrhea and vomiting.  Genitourinary: Positive for difficulty urinating, frequency and urgency.  Musculoskeletal: Negative for arthralgias and myalgias.  Skin: Negative for color change and  rash.  Neurological: Negative for tremors, syncope and headaches.  Psychiatric/Behavioral: Negative for confusion and dysphoric mood.     Physical Exam Updated Vital Signs BP 135/82 (BP Location: Left Arm)   Pulse 81   Temp (!) 100.5 F (38.1 C) (Oral)   Resp 20   Ht 5' 10.5" (1.791 m)   Wt 90.7 kg (200 lb)   SpO2 98%   BMI 28.29 kg/m   Physical Exam  Constitutional: He appears well-developed and well-nourished.  HENT:  Head: Normocephalic and atraumatic.  Eyes: Pupils are equal,  round, and reactive to light. EOM are normal.  Neck: Normal range of motion. Neck supple. No JVD present.  Cardiovascular: Normal rate and regular rhythm. Exam reveals no gallop and no friction rub.  No murmur heard. Pulmonary/Chest: No respiratory distress. He has no wheezes.  Abdominal: He exhibits no distension and no mass. There is no tenderness. There is no rebound and no guarding.  Musculoskeletal: Normal range of motion.  Neurological: He is alert.  Pleasantly confused  Skin: No rash noted. No pallor.  Psychiatric: He has a normal mood and affect. His behavior is normal.  Nursing note and vitals reviewed.    ED Treatments / Results  Labs (all labs ordered are listed, but only abnormal results are displayed) Labs Reviewed  COMPREHENSIVE METABOLIC PANEL - Abnormal; Notable for the following components:      Result Value   Glucose, Bld 114 (*)    All other components within normal limits  CBC WITH DIFFERENTIAL/PLATELET - Abnormal; Notable for the following components:   WBC 12.9 (*)    Neutro Abs 10.2 (*)    Monocytes Absolute 1.1 (*)    All other components within normal limits  CULTURE, BLOOD (ROUTINE X 2)  CULTURE, BLOOD (ROUTINE X 2)  URINALYSIS, ROUTINE W REFLEX MICROSCOPIC  I-STAT CG4 LACTIC ACID, ED    EKG None  Radiology Dg Chest 2 View  Result Date: 04/15/2018 CLINICAL DATA:  Difficulty urinating since lunch time today. EXAM: CHEST - 2 VIEW COMPARISON:  None. FINDINGS: Heart is top-normal in size. Slightly ectatic thoracic aorta without aneurysm. No acute pulmonary consolidations. No acute osseous abnormality. IMPRESSION: No active cardiopulmonary disease. Electronically Signed   By: Tollie Eth M.D.   On: 04/15/2018 18:56    Procedures Procedures (including critical care time)  Medications Ordered in ED Medications  acetaminophen (TYLENOL) tablet 1,000 mg (has no administration in time range)     Initial Impression / Assessment and Plan / ED Course  I  have reviewed the triage vital signs and the nursing notes.  Pertinent labs & imaging results that were available during my care of the patient were reviewed by me and considered in my medical decision making (see chart for details).     84 yo M with a chief complaint of difficulty urinating.  Patient having urgency frequency and hesitancy.  Suspect that he has a urinary tract infection.  He has mild leukocytosis.  Was febrile here to 100.5.  Patient unfortunately was incontinent of urine on arrival.  My ultrasound shows about 100 cc of urine in the bladder.  Patient not feeling an urge.  We will give a bolus of fluids.  Nursing having difficulty placing IV patient was able to provide a sample.  Did not feel that he needs a bag of fluids.  Will give oral.  We will give a dose of Tylenol.  Discussed what I felt was most likely with the family.  Awaiting UA  results.   Turned over to Dr. Preston Fleeting, please see his note for further details of care.   The patients results and plan were reviewed and discussed.   Any x-rays performed were independently reviewed by myself.   Differential diagnosis were considered with the presenting HPI.  Medications  acetaminophen (TYLENOL) tablet 1,000 mg (has no administration in time range)    Vitals:   04/15/18 1803 04/15/18 2238  BP: (!) 146/83 135/82  Pulse: 94 81  Resp: 18 20  Temp: (!) 100.5 F (38.1 C)   TempSrc: Oral   SpO2: 96% 98%  Weight: 90.7 kg (200 lb)   Height: 5' 10.5" (1.791 m)     Final diagnoses:  Dysuria  Urinary urgency    Admission/ observation were discussed with the admitting physician, patient and/or family and they are comfortable with the plan.    Final Clinical Impressions(s) / ED Diagnoses   Final diagnoses:  Dysuria  Urinary urgency    ED Discharge Orders    None       Melene Plan, DO 04/15/18 2349

## 2018-04-15 NOTE — ED Notes (Signed)
Pt stated previous draw was difficult, blood draw attempted with no success. Pt jerked in pain.

## 2018-04-15 NOTE — Progress Notes (Signed)
Per pt's RN, EDP has discontinued PIV insertion on pt.

## 2018-04-15 NOTE — Discharge Instructions (Addendum)
Drink plenty of fluids.  Return for worsening symptoms, confusion, weakness, vomiting.

## 2018-04-16 LAB — URINALYSIS, ROUTINE W REFLEX MICROSCOPIC
Bilirubin Urine: NEGATIVE
Glucose, UA: NEGATIVE mg/dL
Ketones, ur: NEGATIVE mg/dL
NITRITE: NEGATIVE
Protein, ur: NEGATIVE mg/dL
SPECIFIC GRAVITY, URINE: 1.012 (ref 1.005–1.030)
WBC, UA: >50 WBC/hpf — ABNORMAL HIGH (ref 0–5)
pH: 8 (ref 5.0–8.0)

## 2018-04-16 MED ORDER — STERILE WATER FOR INJECTION IJ SOLN
INTRAMUSCULAR | Status: AC
  Administered 2018-04-16: 2.1 mL
  Filled 2018-04-16: qty 10

## 2018-04-16 MED ORDER — CEFTRIAXONE SODIUM 1 G IJ SOLR
1.0000 g | Freq: Once | INTRAMUSCULAR | Status: AC
  Administered 2018-04-16: 1 g via INTRAMUSCULAR
  Filled 2018-04-16: qty 10

## 2018-04-16 MED ORDER — SODIUM CHLORIDE 0.9 % IV SOLN: 1.0000 g | Freq: Once | INTRAVENOUS | Status: DC

## 2018-04-16 MED ORDER — CEPHALEXIN 500 MG PO CAPS: 500.0000 mg | ORAL_CAPSULE | Freq: Three times a day (TID) | ORAL | 0 refills | Status: DC

## 2018-04-16 NOTE — ED Provider Notes (Signed)
Initial evaluation by Dr. Adela LankFloyd for fever and urinary symptoms, case signed out to me pending urinalysis.  Urinalysis is consistent with UTI with greater than 50 WBCs.  Urine is sent for culture.  He is given a dose of ceftriaxone in the ED.  He continues to be nontoxic in appearance and it is felt to be reasonable to try him at home on oral antibiotics.  He is discharged with prescription for cephalexin, told to follow-up with PCP in 3 days.  Return precautions discussed.   Dione BoozeGlick, Nohemi Nicklaus, MD 04/16/18 [phone]

## 2018-04-18 DIAGNOSIS — N3941 Urge incontinence: Secondary | ICD-10-CM | POA: Diagnosis not present

## 2018-04-18 LAB — URINE CULTURE: Culture: >=100000 — AB

## 2018-04-19 ENCOUNTER — Telehealth: Payer: Self-pay | Admitting: Emergency Medicine

## 2018-04-19 NOTE — Telephone Encounter (Signed)
Post ED Visit - Positive Culture Follow-up  Culture report reviewed by antimicrobial stewardship pharmacist:  []  Enzo BiNathan Batchelder, Pharm.D. []  Celedonio MiyamotoJeremy Frens, Pharm.D., BCPS AQ-ID []  Garvin FilaMike Maccia, Pharm.D., BCPS []  Georgina PillionElizabeth Martin, Pharm.D., BCPS []  PlevnaMinh Pham, [address] BoulevardPharm.D., BCPS, AAHIVP []  Estella HuskMichelle Turner, Pharm.D., BCPS, AAHIVP []  Lysle Pearlachel Rumbarger, PharmD, BCPS []  Phillips Climeshuy Dang, PharmD, BCPS []  Agapito GamesAlison Masters, PharmD, BCPS []  Verlan FriendsErin Deja, PharmD  Positive urine culture Treated with cephalexin, organism sensitive to the same and no further patient follow-up is required at this time.  Berle MullMiller, Geraline Halberstadt 04/19/2018, 10:12 AM

## 2018-04-20 LAB — CULTURE, BLOOD (ROUTINE X 2)
Culture: NO GROWTH
Special Requests: ADEQUATE

## 2018-04-25 DIAGNOSIS — N3941 Urge incontinence: Secondary | ICD-10-CM | POA: Diagnosis not present

## 2018-05-02 DIAGNOSIS — N3941 Urge incontinence: Secondary | ICD-10-CM | POA: Diagnosis not present

## 2018-05-09 DIAGNOSIS — N3941 Urge incontinence: Secondary | ICD-10-CM | POA: Diagnosis not present

## 2018-05-16 DIAGNOSIS — N3941 Urge incontinence: Secondary | ICD-10-CM | POA: Diagnosis not present

## 2018-05-17 DIAGNOSIS — R4189 Other symptoms and signs involving cognitive functions and awareness: Secondary | ICD-10-CM | POA: Diagnosis not present

## 2018-05-17 DIAGNOSIS — F322 Major depressive disorder, single episode, severe without psychotic features: Secondary | ICD-10-CM | POA: Diagnosis not present

## 2018-05-17 DIAGNOSIS — R300 Dysuria: Secondary | ICD-10-CM | POA: Diagnosis not present

## 2018-05-17 DIAGNOSIS — N400 Enlarged prostate without lower urinary tract symptoms: Secondary | ICD-10-CM | POA: Diagnosis not present

## 2018-05-17 DIAGNOSIS — G459 Transient cerebral ischemic attack, unspecified: Secondary | ICD-10-CM | POA: Diagnosis not present

## 2018-06-14 DIAGNOSIS — N390 Urinary tract infection, site not specified: Secondary | ICD-10-CM | POA: Diagnosis not present

## 2018-06-15 DIAGNOSIS — N390 Urinary tract infection, site not specified: Secondary | ICD-10-CM | POA: Diagnosis not present

## 2018-07-05 DIAGNOSIS — R351 Nocturia: Secondary | ICD-10-CM | POA: Diagnosis not present

## 2018-08-16 DIAGNOSIS — Z0000 Encounter for general adult medical examination without abnormal findings: Secondary | ICD-10-CM | POA: Diagnosis not present

## 2018-08-16 DIAGNOSIS — R4182 Altered mental status, unspecified: Secondary | ICD-10-CM | POA: Diagnosis not present

## 2018-08-16 DIAGNOSIS — R4189 Other symptoms and signs involving cognitive functions and awareness: Secondary | ICD-10-CM | POA: Diagnosis not present

## 2018-08-16 DIAGNOSIS — F322 Major depressive disorder, single episode, severe without psychotic features: Secondary | ICD-10-CM | POA: Diagnosis not present

## 2018-08-16 DIAGNOSIS — Z1389 Encounter for screening for other disorder: Secondary | ICD-10-CM | POA: Diagnosis not present

## 2018-08-16 DIAGNOSIS — N400 Enlarged prostate without lower urinary tract symptoms: Secondary | ICD-10-CM | POA: Diagnosis not present

## 2018-08-16 DIAGNOSIS — K5900 Constipation, unspecified: Secondary | ICD-10-CM | POA: Diagnosis not present

## 2018-08-16 DIAGNOSIS — M5136 Other intervertebral disc degeneration, lumbar region: Secondary | ICD-10-CM | POA: Diagnosis not present

## 2018-08-16 DIAGNOSIS — G459 Transient cerebral ischemic attack, unspecified: Secondary | ICD-10-CM | POA: Diagnosis not present

## 2018-08-18 DIAGNOSIS — N3941 Urge incontinence: Secondary | ICD-10-CM | POA: Diagnosis not present

## 2018-08-18 DIAGNOSIS — R351 Nocturia: Secondary | ICD-10-CM | POA: Diagnosis not present

## 2018-08-18 DIAGNOSIS — N390 Urinary tract infection, site not specified: Secondary | ICD-10-CM | POA: Diagnosis not present

## 2018-08-21 DIAGNOSIS — H04123 Dry eye syndrome of bilateral lacrimal glands: Secondary | ICD-10-CM | POA: Diagnosis not present

## 2018-08-21 DIAGNOSIS — H52203 Unspecified astigmatism, bilateral: Secondary | ICD-10-CM | POA: Diagnosis not present

## 2018-08-21 DIAGNOSIS — H4010X2 Unspecified open-angle glaucoma, moderate stage: Secondary | ICD-10-CM | POA: Diagnosis not present

## 2018-08-21 DIAGNOSIS — H35371 Puckering of macula, right eye: Secondary | ICD-10-CM | POA: Diagnosis not present

## 2018-08-30 DIAGNOSIS — G459 Transient cerebral ischemic attack, unspecified: Secondary | ICD-10-CM | POA: Diagnosis not present

## 2018-08-30 DIAGNOSIS — H409 Unspecified glaucoma: Secondary | ICD-10-CM | POA: Diagnosis not present

## 2018-08-30 DIAGNOSIS — F329 Major depressive disorder, single episode, unspecified: Secondary | ICD-10-CM | POA: Diagnosis not present

## 2018-08-30 DIAGNOSIS — F322 Major depressive disorder, single episode, severe without psychotic features: Secondary | ICD-10-CM | POA: Diagnosis not present

## 2018-08-30 DIAGNOSIS — N400 Enlarged prostate without lower urinary tract symptoms: Secondary | ICD-10-CM | POA: Diagnosis not present

## 2018-09-19 DIAGNOSIS — F322 Major depressive disorder, single episode, severe without psychotic features: Secondary | ICD-10-CM | POA: Diagnosis not present

## 2018-09-19 DIAGNOSIS — K5900 Constipation, unspecified: Secondary | ICD-10-CM | POA: Diagnosis not present

## 2018-09-19 DIAGNOSIS — H409 Unspecified glaucoma: Secondary | ICD-10-CM | POA: Diagnosis not present

## 2018-09-19 DIAGNOSIS — Z789 Other specified health status: Secondary | ICD-10-CM | POA: Diagnosis not present

## 2018-09-19 DIAGNOSIS — R339 Retention of urine, unspecified: Secondary | ICD-10-CM | POA: Diagnosis not present

## 2018-09-19 DIAGNOSIS — R4189 Other symptoms and signs involving cognitive functions and awareness: Secondary | ICD-10-CM | POA: Diagnosis not present

## 2018-09-19 DIAGNOSIS — G459 Transient cerebral ischemic attack, unspecified: Secondary | ICD-10-CM | POA: Diagnosis not present

## 2018-09-19 DIAGNOSIS — E7800 Pure hypercholesterolemia, unspecified: Secondary | ICD-10-CM | POA: Diagnosis not present

## 2018-09-19 DIAGNOSIS — M545 Low back pain: Secondary | ICD-10-CM | POA: Diagnosis not present

## 2018-09-19 DIAGNOSIS — N400 Enlarged prostate without lower urinary tract symptoms: Secondary | ICD-10-CM | POA: Diagnosis not present

## 2018-10-02 DIAGNOSIS — G459 Transient cerebral ischemic attack, unspecified: Secondary | ICD-10-CM | POA: Diagnosis not present

## 2018-10-02 DIAGNOSIS — H409 Unspecified glaucoma: Secondary | ICD-10-CM | POA: Diagnosis not present

## 2018-10-02 DIAGNOSIS — N400 Enlarged prostate without lower urinary tract symptoms: Secondary | ICD-10-CM | POA: Diagnosis not present

## 2018-10-02 DIAGNOSIS — F322 Major depressive disorder, single episode, severe without psychotic features: Secondary | ICD-10-CM | POA: Diagnosis not present

## 2018-10-02 DIAGNOSIS — F329 Major depressive disorder, single episode, unspecified: Secondary | ICD-10-CM | POA: Diagnosis not present

## 2018-10-12 DIAGNOSIS — N3941 Urge incontinence: Secondary | ICD-10-CM | POA: Diagnosis not present

## 2018-10-12 DIAGNOSIS — R351 Nocturia: Secondary | ICD-10-CM | POA: Diagnosis not present

## 2018-10-23 DIAGNOSIS — H409 Unspecified glaucoma: Secondary | ICD-10-CM | POA: Diagnosis not present

## 2018-10-23 DIAGNOSIS — F329 Major depressive disorder, single episode, unspecified: Secondary | ICD-10-CM | POA: Diagnosis not present

## 2018-10-23 DIAGNOSIS — G459 Transient cerebral ischemic attack, unspecified: Secondary | ICD-10-CM | POA: Diagnosis not present

## 2018-10-23 DIAGNOSIS — N400 Enlarged prostate without lower urinary tract symptoms: Secondary | ICD-10-CM | POA: Diagnosis not present

## 2018-10-23 DIAGNOSIS — F322 Major depressive disorder, single episode, severe without psychotic features: Secondary | ICD-10-CM | POA: Diagnosis not present

## 2018-11-28 DIAGNOSIS — N400 Enlarged prostate without lower urinary tract symptoms: Secondary | ICD-10-CM | POA: Diagnosis not present

## 2018-11-28 DIAGNOSIS — G459 Transient cerebral ischemic attack, unspecified: Secondary | ICD-10-CM | POA: Diagnosis not present

## 2018-11-28 DIAGNOSIS — H409 Unspecified glaucoma: Secondary | ICD-10-CM | POA: Diagnosis not present

## 2018-11-28 DIAGNOSIS — F322 Major depressive disorder, single episode, severe without psychotic features: Secondary | ICD-10-CM | POA: Diagnosis not present

## 2018-11-28 DIAGNOSIS — F329 Major depressive disorder, single episode, unspecified: Secondary | ICD-10-CM | POA: Diagnosis not present

## 2018-12-20 DIAGNOSIS — M545 Low back pain: Secondary | ICD-10-CM | POA: Diagnosis not present

## 2018-12-20 DIAGNOSIS — R339 Retention of urine, unspecified: Secondary | ICD-10-CM | POA: Diagnosis not present

## 2018-12-20 DIAGNOSIS — N400 Enlarged prostate without lower urinary tract symptoms: Secondary | ICD-10-CM | POA: Diagnosis not present

## 2018-12-20 DIAGNOSIS — F322 Major depressive disorder, single episode, severe without psychotic features: Secondary | ICD-10-CM | POA: Diagnosis not present

## 2018-12-20 DIAGNOSIS — H409 Unspecified glaucoma: Secondary | ICD-10-CM | POA: Diagnosis not present

## 2018-12-20 DIAGNOSIS — K5900 Constipation, unspecified: Secondary | ICD-10-CM | POA: Diagnosis not present

## 2018-12-20 DIAGNOSIS — G459 Transient cerebral ischemic attack, unspecified: Secondary | ICD-10-CM | POA: Diagnosis not present

## 2018-12-27 DIAGNOSIS — F329 Major depressive disorder, single episode, unspecified: Secondary | ICD-10-CM | POA: Diagnosis not present

## 2018-12-27 DIAGNOSIS — N400 Enlarged prostate without lower urinary tract symptoms: Secondary | ICD-10-CM | POA: Diagnosis not present

## 2018-12-27 DIAGNOSIS — F322 Major depressive disorder, single episode, severe without psychotic features: Secondary | ICD-10-CM | POA: Diagnosis not present

## 2018-12-27 DIAGNOSIS — G459 Transient cerebral ischemic attack, unspecified: Secondary | ICD-10-CM | POA: Diagnosis not present

## 2018-12-27 DIAGNOSIS — E7800 Pure hypercholesterolemia, unspecified: Secondary | ICD-10-CM | POA: Diagnosis not present

## 2018-12-27 DIAGNOSIS — H409 Unspecified glaucoma: Secondary | ICD-10-CM | POA: Diagnosis not present

## 2019-01-02 DIAGNOSIS — N3941 Urge incontinence: Secondary | ICD-10-CM | POA: Diagnosis not present

## 2019-01-02 DIAGNOSIS — R351 Nocturia: Secondary | ICD-10-CM | POA: Diagnosis not present

## 2019-01-23 DIAGNOSIS — G459 Transient cerebral ischemic attack, unspecified: Secondary | ICD-10-CM | POA: Diagnosis not present

## 2019-01-23 DIAGNOSIS — H409 Unspecified glaucoma: Secondary | ICD-10-CM | POA: Diagnosis not present

## 2019-01-23 DIAGNOSIS — F322 Major depressive disorder, single episode, severe without psychotic features: Secondary | ICD-10-CM | POA: Diagnosis not present

## 2019-01-23 DIAGNOSIS — N400 Enlarged prostate without lower urinary tract symptoms: Secondary | ICD-10-CM | POA: Diagnosis not present

## 2019-01-23 DIAGNOSIS — E7800 Pure hypercholesterolemia, unspecified: Secondary | ICD-10-CM | POA: Diagnosis not present

## 2019-01-23 DIAGNOSIS — F329 Major depressive disorder, single episode, unspecified: Secondary | ICD-10-CM | POA: Diagnosis not present

## 2019-02-21 DIAGNOSIS — G459 Transient cerebral ischemic attack, unspecified: Secondary | ICD-10-CM | POA: Diagnosis not present

## 2019-02-21 DIAGNOSIS — E7800 Pure hypercholesterolemia, unspecified: Secondary | ICD-10-CM | POA: Diagnosis not present

## 2019-02-21 DIAGNOSIS — F322 Major depressive disorder, single episode, severe without psychotic features: Secondary | ICD-10-CM | POA: Diagnosis not present

## 2019-02-21 DIAGNOSIS — F329 Major depressive disorder, single episode, unspecified: Secondary | ICD-10-CM | POA: Diagnosis not present

## 2019-02-21 DIAGNOSIS — N400 Enlarged prostate without lower urinary tract symptoms: Secondary | ICD-10-CM | POA: Diagnosis not present

## 2019-02-21 DIAGNOSIS — H409 Unspecified glaucoma: Secondary | ICD-10-CM | POA: Diagnosis not present

## 2019-02-26 DIAGNOSIS — H35371 Puckering of macula, right eye: Secondary | ICD-10-CM | POA: Diagnosis not present

## 2019-02-26 DIAGNOSIS — H0100B Unspecified blepharitis left eye, upper and lower eyelids: Secondary | ICD-10-CM | POA: Diagnosis not present

## 2019-02-26 DIAGNOSIS — H4010X2 Unspecified open-angle glaucoma, moderate stage: Secondary | ICD-10-CM | POA: Diagnosis not present

## 2019-02-26 DIAGNOSIS — H0100A Unspecified blepharitis right eye, upper and lower eyelids: Secondary | ICD-10-CM | POA: Diagnosis not present

## 2019-03-21 DIAGNOSIS — G459 Transient cerebral ischemic attack, unspecified: Secondary | ICD-10-CM | POA: Diagnosis not present

## 2019-03-21 DIAGNOSIS — K5900 Constipation, unspecified: Secondary | ICD-10-CM | POA: Diagnosis not present

## 2019-03-21 DIAGNOSIS — M545 Low back pain: Secondary | ICD-10-CM | POA: Diagnosis not present

## 2019-03-21 DIAGNOSIS — E7800 Pure hypercholesterolemia, unspecified: Secondary | ICD-10-CM | POA: Diagnosis not present

## 2019-03-21 DIAGNOSIS — F322 Major depressive disorder, single episode, severe without psychotic features: Secondary | ICD-10-CM | POA: Diagnosis not present

## 2019-03-21 DIAGNOSIS — H409 Unspecified glaucoma: Secondary | ICD-10-CM | POA: Diagnosis not present

## 2019-03-21 DIAGNOSIS — F09 Unspecified mental disorder due to known physiological condition: Secondary | ICD-10-CM | POA: Diagnosis not present

## 2019-03-21 DIAGNOSIS — N400 Enlarged prostate without lower urinary tract symptoms: Secondary | ICD-10-CM | POA: Diagnosis not present

## 2019-04-13 DIAGNOSIS — F322 Major depressive disorder, single episode, severe without psychotic features: Secondary | ICD-10-CM | POA: Diagnosis not present

## 2019-04-13 DIAGNOSIS — H409 Unspecified glaucoma: Secondary | ICD-10-CM | POA: Diagnosis not present

## 2019-04-13 DIAGNOSIS — F329 Major depressive disorder, single episode, unspecified: Secondary | ICD-10-CM | POA: Diagnosis not present

## 2019-04-13 DIAGNOSIS — N400 Enlarged prostate without lower urinary tract symptoms: Secondary | ICD-10-CM | POA: Diagnosis not present

## 2019-04-13 DIAGNOSIS — G459 Transient cerebral ischemic attack, unspecified: Secondary | ICD-10-CM | POA: Diagnosis not present

## 2019-04-13 DIAGNOSIS — E7800 Pure hypercholesterolemia, unspecified: Secondary | ICD-10-CM | POA: Diagnosis not present

## 2019-05-14 DIAGNOSIS — H409 Unspecified glaucoma: Secondary | ICD-10-CM | POA: Diagnosis not present

## 2019-05-14 DIAGNOSIS — F322 Major depressive disorder, single episode, severe without psychotic features: Secondary | ICD-10-CM | POA: Diagnosis not present

## 2019-05-14 DIAGNOSIS — F329 Major depressive disorder, single episode, unspecified: Secondary | ICD-10-CM | POA: Diagnosis not present

## 2019-05-14 DIAGNOSIS — N400 Enlarged prostate without lower urinary tract symptoms: Secondary | ICD-10-CM | POA: Diagnosis not present

## 2019-05-14 DIAGNOSIS — E7800 Pure hypercholesterolemia, unspecified: Secondary | ICD-10-CM | POA: Diagnosis not present

## 2019-05-14 DIAGNOSIS — G459 Transient cerebral ischemic attack, unspecified: Secondary | ICD-10-CM | POA: Diagnosis not present

## 2019-05-15 DIAGNOSIS — Z23 Encounter for immunization: Secondary | ICD-10-CM | POA: Diagnosis not present

## 2019-05-15 DIAGNOSIS — N400 Enlarged prostate without lower urinary tract symptoms: Secondary | ICD-10-CM | POA: Diagnosis not present

## 2019-05-15 DIAGNOSIS — M5430 Sciatica, unspecified side: Secondary | ICD-10-CM | POA: Diagnosis not present

## 2019-05-15 DIAGNOSIS — F09 Unspecified mental disorder due to known physiological condition: Secondary | ICD-10-CM | POA: Diagnosis not present

## 2019-05-15 DIAGNOSIS — M545 Low back pain: Secondary | ICD-10-CM | POA: Diagnosis not present

## 2019-05-15 DIAGNOSIS — F322 Major depressive disorder, single episode, severe without psychotic features: Secondary | ICD-10-CM | POA: Diagnosis not present

## 2019-06-13 DIAGNOSIS — F322 Major depressive disorder, single episode, severe without psychotic features: Secondary | ICD-10-CM | POA: Diagnosis not present

## 2019-06-13 DIAGNOSIS — E7800 Pure hypercholesterolemia, unspecified: Secondary | ICD-10-CM | POA: Diagnosis not present

## 2019-06-13 DIAGNOSIS — G459 Transient cerebral ischemic attack, unspecified: Secondary | ICD-10-CM | POA: Diagnosis not present

## 2019-06-13 DIAGNOSIS — F329 Major depressive disorder, single episode, unspecified: Secondary | ICD-10-CM | POA: Diagnosis not present

## 2019-06-13 DIAGNOSIS — H409 Unspecified glaucoma: Secondary | ICD-10-CM | POA: Diagnosis not present

## 2019-06-13 DIAGNOSIS — N400 Enlarged prostate without lower urinary tract symptoms: Secondary | ICD-10-CM | POA: Diagnosis not present

## 2019-06-15 IMAGING — MR MR HEAD W/O CM
10 of 11 series · 42 of 48 positions shown · non-contrast
Comparison: Prior CT from 01/30/2018 as well as previous brain MRI
from 12/24/2015.

CLINICAL DATA: Initial evaluation for acute confusion, right leg
weakness.

EXAM:
MRI HEAD WITHOUT CONTRAST
TECHNIQUE: Multiplanar, multiecho pulse sequences of the brain and surrounding
structures were obtained without intravenous contrast.

[Series 5001: swi_images · axial · 3.0mm · 0.86mm/px · z∈[-65,+110]mm · 6 of 60 slices shown]
[im 1/60]
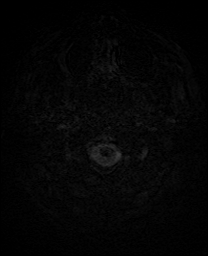
[im 12/60]
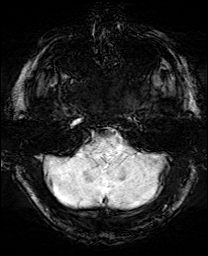
[im 24/60]
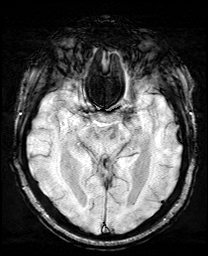
[im 36/60]
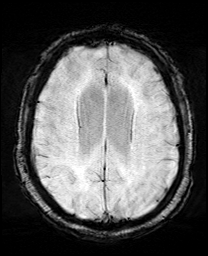
[im 48/60]
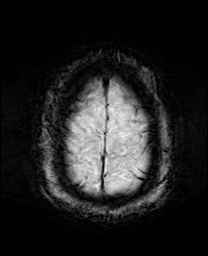
[im 60/60]
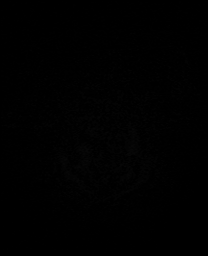

[Series 5001: ax dwi_tracew · axial · 3.0mm · 1.50mm/px · z∈[-60,+90]mm · 8 of 80 slices shown]
[im 1/80]
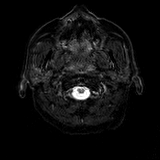
[im 12/80]
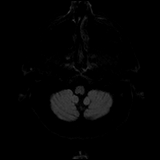
[im 23/80]
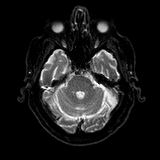
[im 34/80]
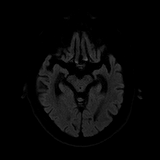
[im 46/80]
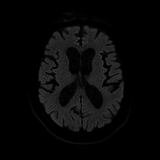
[im 57/80]
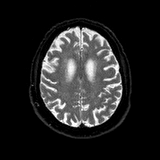
[im 68/80]
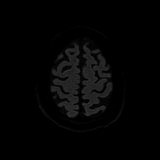
[im 80/80]
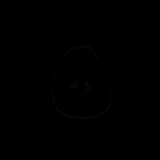

[Series 6001: mip_images(sw) · axial · 24.0mm · 0.86mm/px · z∈[-55,+99]mm · 5 of 53 slices shown]
[im 1/53]
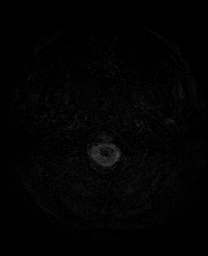
[im 14/53]
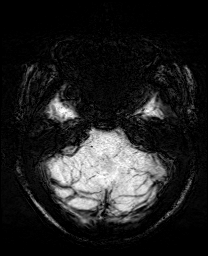
[im 27/53]
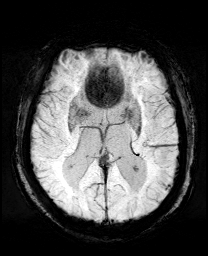
[im 40/53]
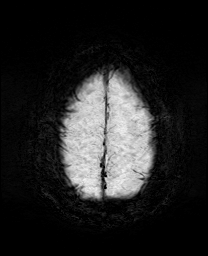
[im 53/53]
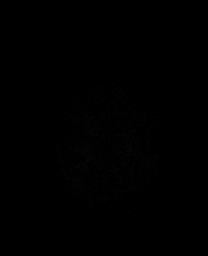

[Series 6001: ax dwi_adc · axial · 3.0mm · 1.50mm/px · z∈[-60,+90]mm · 4 of 40 slices shown]
[im 1/40]
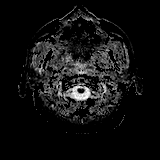
[im 14/40]
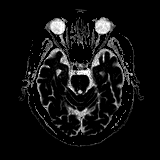
[im 27/40]
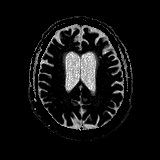
[im 40/40]
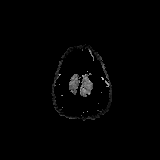

[Series 7001: DWI · coronal · 4.0mm · 0.88mm/px · 7 of 70 slices shown (1 of 2)]
[im 1/70]
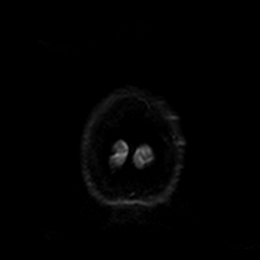
[im 12/70]
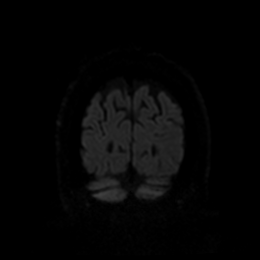
[im 24/70]
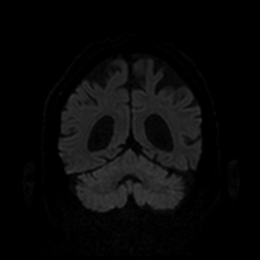
[im 35/70]
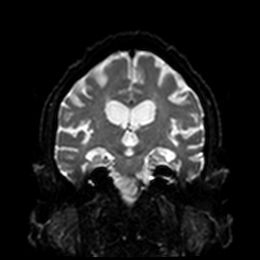
[im 47/70]
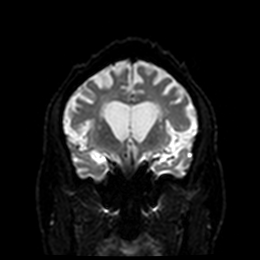
[im 58/70]
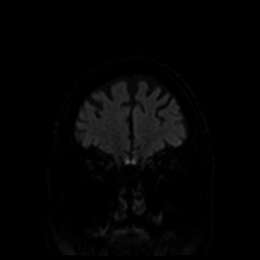
[im 70/70]
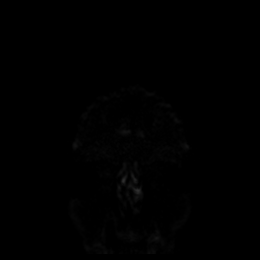

[Series 8001: DWI · coronal · 4.0mm · 0.88mm/px · 3 of 35 slices shown (2 of 2)]
[im 1/35]
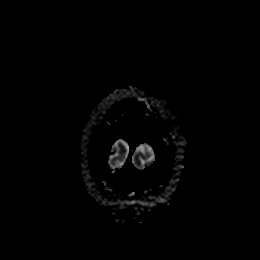
[im 18/35]
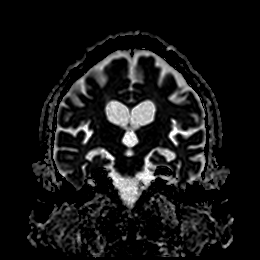
[im 35/35]
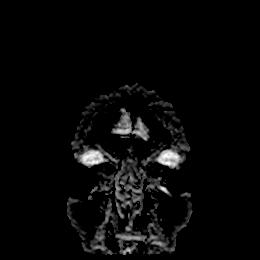

[Series 8001: T2 · coronal · 5.0mm · 0.34mm/px · 3 of 29 slices shown (1 of 2)]
[im 1/29]
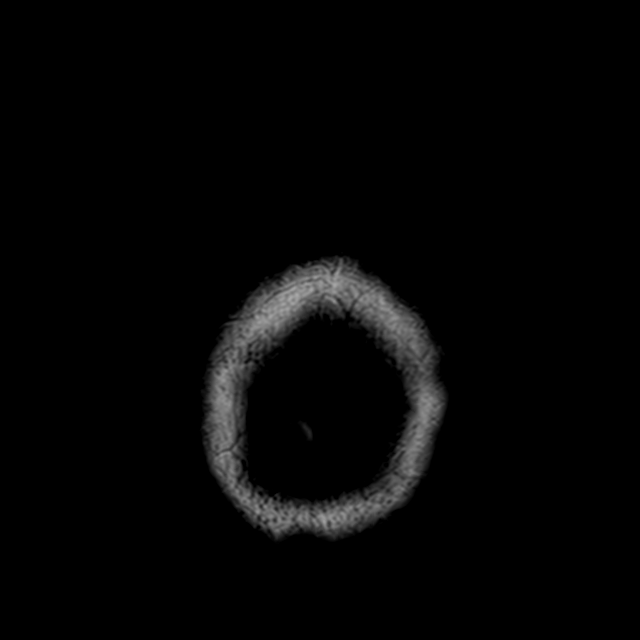
[im 15/29]
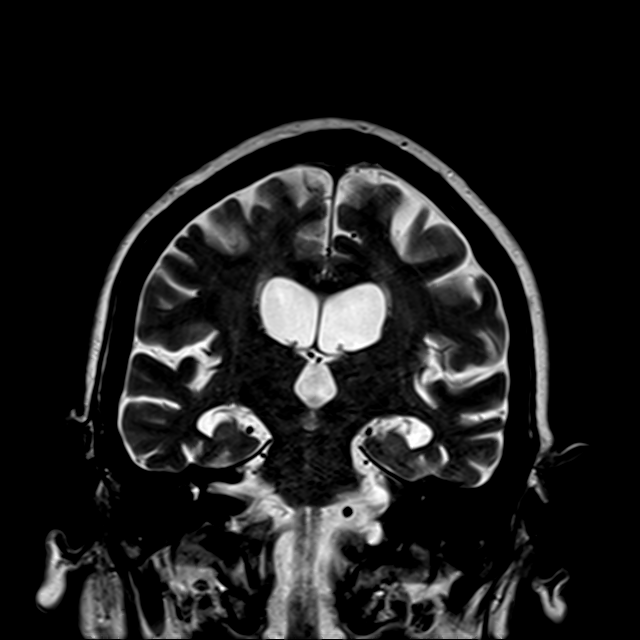
[im 29/29]
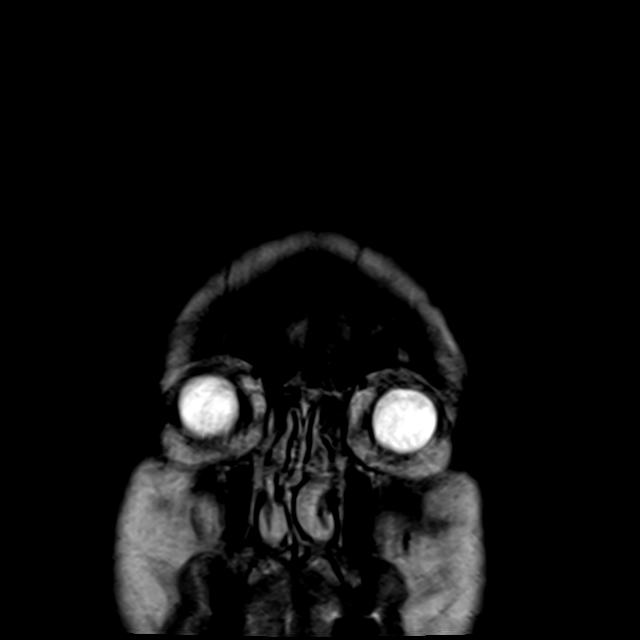

[Series 9001: T1 · sagittal · 5.0mm · 0.75mm/px · 2 of 24 slices shown]
[im 1/24]
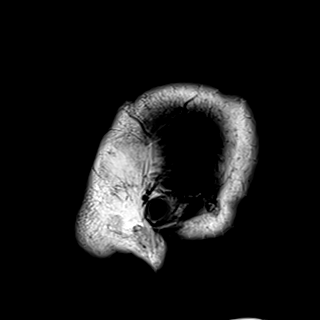
[im 24/24]
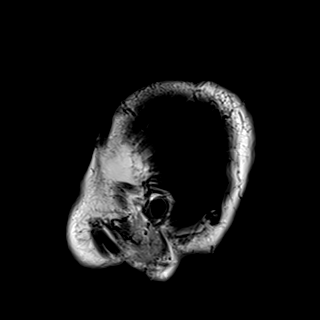

[T2 · axial · 5.0mm · 0.69mm/px · z∈[-53,+89]mm · 2 of 25 slices shown (2 of 2)]
[im 1/25]
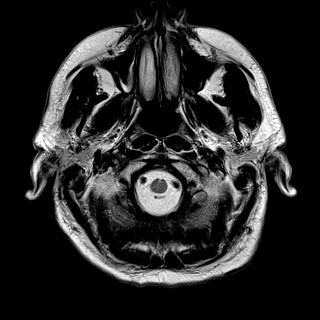
[im 25/25]
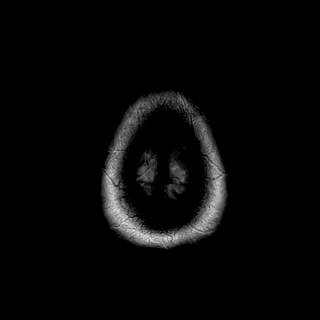

[FLAIR · axial · 5.0mm · 0.43mm/px · z∈[-54,+88]mm · 2 of 25 slices shown]
[im 1/25]
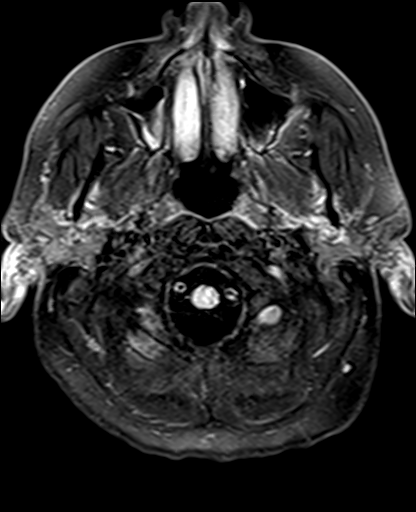
[im 25/25]
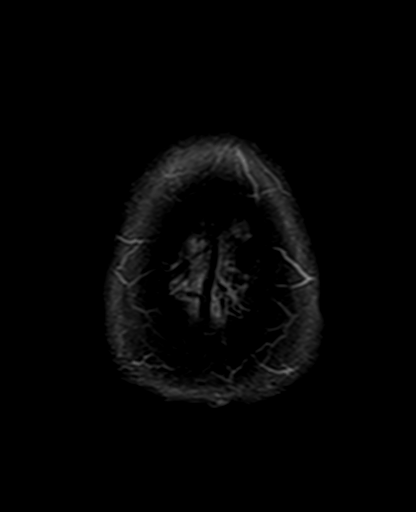

[42 of 48 positions shown; findings below may reference images not displayed]

FINDINGS: Brain: Moderate cerebral volume loss with mild chronic small vessel
ischemic change. No abnormal foci of restricted diffusion to suggest
acute or subacute ischemia. Gray-white matter differentiation
maintained. No encephalomalacia to suggest chronic infarction. No
foci of susceptibility artifact to suggest acute or chronic
intracranial hemorrhage.

No mass lesion, midline shift or mass effect. Ventricular prominence
related to global parenchymal volume loss without hydrocephalus. No
extra-axial fluid collection.

Pituitary gland suprasellar region normal. Midline structures intact
and normal.

Vascular: Major intracranial vascular flow voids maintained.

Skull and upper cervical spine: Craniocervical junction within
normal limits. Visualized upper cervical spine normal. Bone marrow
signal intensity normal. No acute scalp soft tissue abnormality.

Sinuses/Orbits: Globes and orbital soft tissues within normal
limits. Patient status post lens extraction bilaterally. Scattered
mucosal thickening within the ethmoidal air cells and maxillary
sinuses. Paranasal sinuses otherwise clear. No significant mastoid
effusion. Inner ear structures normal.

Other: None.
IMPRESSION: 1. No acute intracranial abnormality.
2. Moderate cerebral atrophy with mild chronic small vessel ischemic
disease.

## 2019-06-19 DIAGNOSIS — F09 Unspecified mental disorder due to known physiological condition: Secondary | ICD-10-CM | POA: Diagnosis not present

## 2019-06-19 DIAGNOSIS — F322 Major depressive disorder, single episode, severe without psychotic features: Secondary | ICD-10-CM | POA: Diagnosis not present

## 2019-07-12 DIAGNOSIS — G459 Transient cerebral ischemic attack, unspecified: Secondary | ICD-10-CM | POA: Diagnosis not present

## 2019-07-12 DIAGNOSIS — H409 Unspecified glaucoma: Secondary | ICD-10-CM | POA: Diagnosis not present

## 2019-07-12 DIAGNOSIS — N400 Enlarged prostate without lower urinary tract symptoms: Secondary | ICD-10-CM | POA: Diagnosis not present

## 2019-07-12 DIAGNOSIS — F329 Major depressive disorder, single episode, unspecified: Secondary | ICD-10-CM | POA: Diagnosis not present

## 2019-07-12 DIAGNOSIS — F322 Major depressive disorder, single episode, severe without psychotic features: Secondary | ICD-10-CM | POA: Diagnosis not present

## 2019-07-12 DIAGNOSIS — E7800 Pure hypercholesterolemia, unspecified: Secondary | ICD-10-CM | POA: Diagnosis not present

## 2019-08-13 DIAGNOSIS — G459 Transient cerebral ischemic attack, unspecified: Secondary | ICD-10-CM | POA: Diagnosis not present

## 2019-08-13 DIAGNOSIS — H409 Unspecified glaucoma: Secondary | ICD-10-CM | POA: Diagnosis not present

## 2019-08-13 DIAGNOSIS — F322 Major depressive disorder, single episode, severe without psychotic features: Secondary | ICD-10-CM | POA: Diagnosis not present

## 2019-08-13 DIAGNOSIS — E7800 Pure hypercholesterolemia, unspecified: Secondary | ICD-10-CM | POA: Diagnosis not present

## 2019-08-13 DIAGNOSIS — N400 Enlarged prostate without lower urinary tract symptoms: Secondary | ICD-10-CM | POA: Diagnosis not present

## 2019-08-13 DIAGNOSIS — F329 Major depressive disorder, single episode, unspecified: Secondary | ICD-10-CM | POA: Diagnosis not present

## 2019-08-30 DIAGNOSIS — H53001 Unspecified amblyopia, right eye: Secondary | ICD-10-CM | POA: Diagnosis not present

## 2019-08-30 DIAGNOSIS — H35371 Puckering of macula, right eye: Secondary | ICD-10-CM | POA: Diagnosis not present

## 2019-08-30 DIAGNOSIS — Z961 Presence of intraocular lens: Secondary | ICD-10-CM | POA: Diagnosis not present

## 2019-08-30 DIAGNOSIS — H4010X2 Unspecified open-angle glaucoma, moderate stage: Secondary | ICD-10-CM | POA: Diagnosis not present

## 2019-08-31 DIAGNOSIS — G459 Transient cerebral ischemic attack, unspecified: Secondary | ICD-10-CM | POA: Diagnosis not present

## 2019-08-31 DIAGNOSIS — F322 Major depressive disorder, single episode, severe without psychotic features: Secondary | ICD-10-CM | POA: Diagnosis not present

## 2019-08-31 DIAGNOSIS — F329 Major depressive disorder, single episode, unspecified: Secondary | ICD-10-CM | POA: Diagnosis not present

## 2019-08-31 DIAGNOSIS — F09 Unspecified mental disorder due to known physiological condition: Secondary | ICD-10-CM | POA: Diagnosis not present

## 2019-08-31 DIAGNOSIS — Z0000 Encounter for general adult medical examination without abnormal findings: Secondary | ICD-10-CM | POA: Diagnosis not present

## 2019-08-31 DIAGNOSIS — H409 Unspecified glaucoma: Secondary | ICD-10-CM | POA: Diagnosis not present

## 2019-08-31 DIAGNOSIS — E7800 Pure hypercholesterolemia, unspecified: Secondary | ICD-10-CM | POA: Diagnosis not present

## 2019-08-31 DIAGNOSIS — N400 Enlarged prostate without lower urinary tract symptoms: Secondary | ICD-10-CM | POA: Diagnosis not present

## 2019-08-31 DIAGNOSIS — B351 Tinea unguium: Secondary | ICD-10-CM | POA: Diagnosis not present

## 2019-08-31 DIAGNOSIS — Z79899 Other long term (current) drug therapy: Secondary | ICD-10-CM | POA: Diagnosis not present

## 2019-08-31 DIAGNOSIS — Z1389 Encounter for screening for other disorder: Secondary | ICD-10-CM | POA: Diagnosis not present

## 2019-09-11 DIAGNOSIS — E7800 Pure hypercholesterolemia, unspecified: Secondary | ICD-10-CM | POA: Diagnosis not present

## 2019-09-11 DIAGNOSIS — F322 Major depressive disorder, single episode, severe without psychotic features: Secondary | ICD-10-CM | POA: Diagnosis not present

## 2019-09-11 DIAGNOSIS — N400 Enlarged prostate without lower urinary tract symptoms: Secondary | ICD-10-CM | POA: Diagnosis not present

## 2019-09-11 DIAGNOSIS — G459 Transient cerebral ischemic attack, unspecified: Secondary | ICD-10-CM | POA: Diagnosis not present

## 2019-09-11 DIAGNOSIS — F329 Major depressive disorder, single episode, unspecified: Secondary | ICD-10-CM | POA: Diagnosis not present

## 2019-09-11 DIAGNOSIS — H409 Unspecified glaucoma: Secondary | ICD-10-CM | POA: Diagnosis not present

## 2019-10-28 ENCOUNTER — Ambulatory Visit: Payer: PPO | Attending: Internal Medicine

## 2019-10-28 DIAGNOSIS — Z23 Encounter for immunization: Secondary | ICD-10-CM | POA: Insufficient documentation

## 2019-10-28 NOTE — Progress Notes (Signed)
   Covid-19 Vaccination Clinic  Name:  JERREN FLINCHBAUGH    MRN: [id number] DOB: Jul 10, 1934  10/28/2019  Mr. Pilger was observed post Covid-19 immunization for 15 minutes without incidence. He was provided with Vaccine Information Sheet and instruction to access the V-Safe system.   Mr. Staples was instructed to call 911 with any severe reactions post vaccine: Marland Kitchen Difficulty breathing  . Swelling of your face and throat  . A fast heartbeat  . A bad rash all over your body  . Dizziness and weakness    Immunizations Administered    Name Date Dose VIS Date Route   Pfizer COVID-19 Vaccine 10/28/2019  9:27 AM 0.3 mL 08/24/2019 Intramuscular   Manufacturer: [employer], [employer]   Lot: [id number]   NDC: [address]-1

## 2019-11-20 ENCOUNTER — Ambulatory Visit: Payer: PPO | Attending: Internal Medicine

## 2019-11-20 DIAGNOSIS — Z23 Encounter for immunization: Secondary | ICD-10-CM

## 2019-11-20 NOTE — Progress Notes (Signed)
   Covid-19 Vaccination Clinic  Name:  Chad Holden    MRN: [id number] DOB: 01/26/1934  11/20/2019  [name] was observed post Covid-19 immunization for 15 minutes without incident. He was provided with Vaccine Information Sheet and instruction to access the V-Safe system.   [name] was instructed to call 911 with any severe reactions post vaccine: Marland Kitchen Difficulty breathing  . Swelling of face and throat  . A fast heartbeat  . A bad rash all over body  . Dizziness and weakness   Immunizations Administered    Name Date Dose VIS Date Route   Pfizer COVID-19 Vaccine 11/20/2019 11:01 AM 0.3 mL 08/24/2019 Intramuscular   Manufacturer: [employer], [employer]   Lot: [id number]   NDC: [address]-0

## 2019-12-10 DIAGNOSIS — F329 Major depressive disorder, single episode, unspecified: Secondary | ICD-10-CM | POA: Diagnosis not present

## 2019-12-10 DIAGNOSIS — N400 Enlarged prostate without lower urinary tract symptoms: Secondary | ICD-10-CM | POA: Diagnosis not present

## 2019-12-10 DIAGNOSIS — H409 Unspecified glaucoma: Secondary | ICD-10-CM | POA: Diagnosis not present

## 2019-12-10 DIAGNOSIS — G459 Transient cerebral ischemic attack, unspecified: Secondary | ICD-10-CM | POA: Diagnosis not present

## 2019-12-10 DIAGNOSIS — F322 Major depressive disorder, single episode, severe without psychotic features: Secondary | ICD-10-CM | POA: Diagnosis not present

## 2019-12-10 DIAGNOSIS — E7800 Pure hypercholesterolemia, unspecified: Secondary | ICD-10-CM | POA: Diagnosis not present

## 2020-01-04 DIAGNOSIS — N400 Enlarged prostate without lower urinary tract symptoms: Secondary | ICD-10-CM | POA: Diagnosis not present

## 2020-01-04 DIAGNOSIS — F322 Major depressive disorder, single episode, severe without psychotic features: Secondary | ICD-10-CM | POA: Diagnosis not present

## 2020-01-04 DIAGNOSIS — G459 Transient cerebral ischemic attack, unspecified: Secondary | ICD-10-CM | POA: Diagnosis not present

## 2020-01-04 DIAGNOSIS — E7800 Pure hypercholesterolemia, unspecified: Secondary | ICD-10-CM | POA: Diagnosis not present

## 2020-01-04 DIAGNOSIS — H409 Unspecified glaucoma: Secondary | ICD-10-CM | POA: Diagnosis not present

## 2020-01-04 DIAGNOSIS — F329 Major depressive disorder, single episode, unspecified: Secondary | ICD-10-CM | POA: Diagnosis not present

## 2020-02-01 DIAGNOSIS — H409 Unspecified glaucoma: Secondary | ICD-10-CM | POA: Diagnosis not present

## 2020-02-01 DIAGNOSIS — F329 Major depressive disorder, single episode, unspecified: Secondary | ICD-10-CM | POA: Diagnosis not present

## 2020-02-01 DIAGNOSIS — F322 Major depressive disorder, single episode, severe without psychotic features: Secondary | ICD-10-CM | POA: Diagnosis not present

## 2020-02-01 DIAGNOSIS — E7800 Pure hypercholesterolemia, unspecified: Secondary | ICD-10-CM | POA: Diagnosis not present

## 2020-02-01 DIAGNOSIS — G459 Transient cerebral ischemic attack, unspecified: Secondary | ICD-10-CM | POA: Diagnosis not present

## 2020-02-01 DIAGNOSIS — N400 Enlarged prostate without lower urinary tract symptoms: Secondary | ICD-10-CM | POA: Diagnosis not present

## 2020-02-25 DIAGNOSIS — M545 Low back pain: Secondary | ICD-10-CM | POA: Diagnosis not present

## 2020-02-25 DIAGNOSIS — F322 Major depressive disorder, single episode, severe without psychotic features: Secondary | ICD-10-CM | POA: Diagnosis not present

## 2020-02-25 DIAGNOSIS — H409 Unspecified glaucoma: Secondary | ICD-10-CM | POA: Diagnosis not present

## 2020-02-25 DIAGNOSIS — E7800 Pure hypercholesterolemia, unspecified: Secondary | ICD-10-CM | POA: Diagnosis not present

## 2020-02-25 DIAGNOSIS — K5900 Constipation, unspecified: Secondary | ICD-10-CM | POA: Diagnosis not present

## 2020-02-25 DIAGNOSIS — N400 Enlarged prostate without lower urinary tract symptoms: Secondary | ICD-10-CM | POA: Diagnosis not present

## 2020-02-25 DIAGNOSIS — F09 Unspecified mental disorder due to known physiological condition: Secondary | ICD-10-CM | POA: Diagnosis not present

## 2020-02-25 DIAGNOSIS — M5136 Other intervertebral disc degeneration, lumbar region: Secondary | ICD-10-CM | POA: Diagnosis not present

## 2020-03-03 DIAGNOSIS — H26491 Other secondary cataract, right eye: Secondary | ICD-10-CM | POA: Diagnosis not present

## 2020-03-03 DIAGNOSIS — H35371 Puckering of macula, right eye: Secondary | ICD-10-CM | POA: Diagnosis not present

## 2020-03-03 DIAGNOSIS — H4010X2 Unspecified open-angle glaucoma, moderate stage: Secondary | ICD-10-CM | POA: Diagnosis not present

## 2020-03-03 DIAGNOSIS — Z961 Presence of intraocular lens: Secondary | ICD-10-CM | POA: Diagnosis not present

## 2020-03-12 DIAGNOSIS — F322 Major depressive disorder, single episode, severe without psychotic features: Secondary | ICD-10-CM | POA: Diagnosis not present

## 2020-03-12 DIAGNOSIS — E7800 Pure hypercholesterolemia, unspecified: Secondary | ICD-10-CM | POA: Diagnosis not present

## 2020-03-12 DIAGNOSIS — G459 Transient cerebral ischemic attack, unspecified: Secondary | ICD-10-CM | POA: Diagnosis not present

## 2020-03-12 DIAGNOSIS — F329 Major depressive disorder, single episode, unspecified: Secondary | ICD-10-CM | POA: Diagnosis not present

## 2020-03-12 DIAGNOSIS — H409 Unspecified glaucoma: Secondary | ICD-10-CM | POA: Diagnosis not present

## 2020-03-12 DIAGNOSIS — N400 Enlarged prostate without lower urinary tract symptoms: Secondary | ICD-10-CM | POA: Diagnosis not present

## 2020-03-31 DIAGNOSIS — F329 Major depressive disorder, single episode, unspecified: Secondary | ICD-10-CM | POA: Diagnosis not present

## 2020-03-31 DIAGNOSIS — E7800 Pure hypercholesterolemia, unspecified: Secondary | ICD-10-CM | POA: Diagnosis not present

## 2020-03-31 DIAGNOSIS — N400 Enlarged prostate without lower urinary tract symptoms: Secondary | ICD-10-CM | POA: Diagnosis not present

## 2020-03-31 DIAGNOSIS — F322 Major depressive disorder, single episode, severe without psychotic features: Secondary | ICD-10-CM | POA: Diagnosis not present

## 2020-03-31 DIAGNOSIS — G459 Transient cerebral ischemic attack, unspecified: Secondary | ICD-10-CM | POA: Diagnosis not present

## 2020-03-31 DIAGNOSIS — H409 Unspecified glaucoma: Secondary | ICD-10-CM | POA: Diagnosis not present

## 2020-03-31 DIAGNOSIS — R600 Localized edema: Secondary | ICD-10-CM | POA: Diagnosis not present

## 2020-05-01 DIAGNOSIS — N400 Enlarged prostate without lower urinary tract symptoms: Secondary | ICD-10-CM | POA: Diagnosis not present

## 2020-05-01 DIAGNOSIS — H409 Unspecified glaucoma: Secondary | ICD-10-CM | POA: Diagnosis not present

## 2020-05-01 DIAGNOSIS — E7800 Pure hypercholesterolemia, unspecified: Secondary | ICD-10-CM | POA: Diagnosis not present

## 2020-05-01 DIAGNOSIS — G459 Transient cerebral ischemic attack, unspecified: Secondary | ICD-10-CM | POA: Diagnosis not present

## 2020-05-01 DIAGNOSIS — F329 Major depressive disorder, single episode, unspecified: Secondary | ICD-10-CM | POA: Diagnosis not present

## 2020-05-01 DIAGNOSIS — F322 Major depressive disorder, single episode, severe without psychotic features: Secondary | ICD-10-CM | POA: Diagnosis not present

## 2020-06-11 DIAGNOSIS — G459 Transient cerebral ischemic attack, unspecified: Secondary | ICD-10-CM | POA: Diagnosis not present

## 2020-06-11 DIAGNOSIS — E7800 Pure hypercholesterolemia, unspecified: Secondary | ICD-10-CM | POA: Diagnosis not present

## 2020-06-11 DIAGNOSIS — F329 Major depressive disorder, single episode, unspecified: Secondary | ICD-10-CM | POA: Diagnosis not present

## 2020-06-11 DIAGNOSIS — F322 Major depressive disorder, single episode, severe without psychotic features: Secondary | ICD-10-CM | POA: Diagnosis not present

## 2020-06-11 DIAGNOSIS — H409 Unspecified glaucoma: Secondary | ICD-10-CM | POA: Diagnosis not present

## 2020-06-11 DIAGNOSIS — N400 Enlarged prostate without lower urinary tract symptoms: Secondary | ICD-10-CM | POA: Diagnosis not present

## 2020-07-10 DIAGNOSIS — F329 Major depressive disorder, single episode, unspecified: Secondary | ICD-10-CM | POA: Diagnosis not present

## 2020-07-10 DIAGNOSIS — H409 Unspecified glaucoma: Secondary | ICD-10-CM | POA: Diagnosis not present

## 2020-07-10 DIAGNOSIS — E7800 Pure hypercholesterolemia, unspecified: Secondary | ICD-10-CM | POA: Diagnosis not present

## 2020-07-10 DIAGNOSIS — F322 Major depressive disorder, single episode, severe without psychotic features: Secondary | ICD-10-CM | POA: Diagnosis not present

## 2020-07-10 DIAGNOSIS — N400 Enlarged prostate without lower urinary tract symptoms: Secondary | ICD-10-CM | POA: Diagnosis not present

## 2020-07-10 DIAGNOSIS — G459 Transient cerebral ischemic attack, unspecified: Secondary | ICD-10-CM | POA: Diagnosis not present

## 2020-07-30 DIAGNOSIS — R351 Nocturia: Secondary | ICD-10-CM | POA: Diagnosis not present

## 2020-07-30 DIAGNOSIS — N3941 Urge incontinence: Secondary | ICD-10-CM | POA: Diagnosis not present

## 2020-08-09 DIAGNOSIS — N400 Enlarged prostate without lower urinary tract symptoms: Secondary | ICD-10-CM | POA: Diagnosis not present

## 2020-08-09 DIAGNOSIS — H409 Unspecified glaucoma: Secondary | ICD-10-CM | POA: Diagnosis not present

## 2020-08-09 DIAGNOSIS — G459 Transient cerebral ischemic attack, unspecified: Secondary | ICD-10-CM | POA: Diagnosis not present

## 2020-08-09 DIAGNOSIS — F329 Major depressive disorder, single episode, unspecified: Secondary | ICD-10-CM | POA: Diagnosis not present

## 2020-08-09 DIAGNOSIS — F322 Major depressive disorder, single episode, severe without psychotic features: Secondary | ICD-10-CM | POA: Diagnosis not present

## 2020-08-09 DIAGNOSIS — E7800 Pure hypercholesterolemia, unspecified: Secondary | ICD-10-CM | POA: Diagnosis not present

## 2020-08-11 DIAGNOSIS — H35371 Puckering of macula, right eye: Secondary | ICD-10-CM | POA: Diagnosis not present

## 2020-08-11 DIAGNOSIS — H43813 Vitreous degeneration, bilateral: Secondary | ICD-10-CM | POA: Diagnosis not present

## 2020-08-11 DIAGNOSIS — H524 Presbyopia: Secondary | ICD-10-CM | POA: Diagnosis not present

## 2020-08-11 DIAGNOSIS — H4010X2 Unspecified open-angle glaucoma, moderate stage: Secondary | ICD-10-CM | POA: Diagnosis not present

## 2020-09-01 DIAGNOSIS — Z0000 Encounter for general adult medical examination without abnormal findings: Secondary | ICD-10-CM | POA: Diagnosis not present

## 2020-09-02 DIAGNOSIS — Z79899 Other long term (current) drug therapy: Secondary | ICD-10-CM | POA: Diagnosis not present

## 2020-09-02 DIAGNOSIS — B351 Tinea unguium: Secondary | ICD-10-CM | POA: Diagnosis not present

## 2020-09-02 DIAGNOSIS — E559 Vitamin D deficiency, unspecified: Secondary | ICD-10-CM | POA: Diagnosis not present

## 2020-09-02 DIAGNOSIS — F09 Unspecified mental disorder due to known physiological condition: Secondary | ICD-10-CM | POA: Diagnosis not present

## 2020-09-02 DIAGNOSIS — H409 Unspecified glaucoma: Secondary | ICD-10-CM | POA: Diagnosis not present

## 2020-09-02 DIAGNOSIS — Z7189 Other specified counseling: Secondary | ICD-10-CM | POA: Diagnosis not present

## 2020-09-02 DIAGNOSIS — E7800 Pure hypercholesterolemia, unspecified: Secondary | ICD-10-CM | POA: Diagnosis not present

## 2020-09-02 DIAGNOSIS — G459 Transient cerebral ischemic attack, unspecified: Secondary | ICD-10-CM | POA: Diagnosis not present

## 2020-09-02 DIAGNOSIS — F322 Major depressive disorder, single episode, severe without psychotic features: Secondary | ICD-10-CM | POA: Diagnosis not present

## 2020-09-02 DIAGNOSIS — F329 Major depressive disorder, single episode, unspecified: Secondary | ICD-10-CM | POA: Diagnosis not present

## 2020-09-02 DIAGNOSIS — N400 Enlarged prostate without lower urinary tract symptoms: Secondary | ICD-10-CM | POA: Diagnosis not present

## 2020-09-05 DIAGNOSIS — F329 Major depressive disorder, single episode, unspecified: Secondary | ICD-10-CM | POA: Diagnosis not present

## 2020-09-05 DIAGNOSIS — G459 Transient cerebral ischemic attack, unspecified: Secondary | ICD-10-CM | POA: Diagnosis not present

## 2020-09-05 DIAGNOSIS — E7800 Pure hypercholesterolemia, unspecified: Secondary | ICD-10-CM | POA: Diagnosis not present

## 2020-09-05 DIAGNOSIS — H409 Unspecified glaucoma: Secondary | ICD-10-CM | POA: Diagnosis not present

## 2020-09-05 DIAGNOSIS — F322 Major depressive disorder, single episode, severe without psychotic features: Secondary | ICD-10-CM | POA: Diagnosis not present

## 2020-09-05 DIAGNOSIS — N400 Enlarged prostate without lower urinary tract symptoms: Secondary | ICD-10-CM | POA: Diagnosis not present

## 2020-10-06 DIAGNOSIS — E7800 Pure hypercholesterolemia, unspecified: Secondary | ICD-10-CM | POA: Diagnosis not present

## 2020-10-06 DIAGNOSIS — H409 Unspecified glaucoma: Secondary | ICD-10-CM | POA: Diagnosis not present

## 2020-10-06 DIAGNOSIS — N400 Enlarged prostate without lower urinary tract symptoms: Secondary | ICD-10-CM | POA: Diagnosis not present

## 2020-10-06 DIAGNOSIS — F329 Major depressive disorder, single episode, unspecified: Secondary | ICD-10-CM | POA: Diagnosis not present

## 2020-10-06 DIAGNOSIS — G459 Transient cerebral ischemic attack, unspecified: Secondary | ICD-10-CM | POA: Diagnosis not present

## 2020-10-06 DIAGNOSIS — F322 Major depressive disorder, single episode, severe without psychotic features: Secondary | ICD-10-CM | POA: Diagnosis not present

## 2020-11-05 DIAGNOSIS — N400 Enlarged prostate without lower urinary tract symptoms: Secondary | ICD-10-CM | POA: Diagnosis not present

## 2020-11-05 DIAGNOSIS — F329 Major depressive disorder, single episode, unspecified: Secondary | ICD-10-CM | POA: Diagnosis not present

## 2020-11-05 DIAGNOSIS — E7800 Pure hypercholesterolemia, unspecified: Secondary | ICD-10-CM | POA: Diagnosis not present

## 2020-11-05 DIAGNOSIS — H409 Unspecified glaucoma: Secondary | ICD-10-CM | POA: Diagnosis not present

## 2020-11-05 DIAGNOSIS — G459 Transient cerebral ischemic attack, unspecified: Secondary | ICD-10-CM | POA: Diagnosis not present

## 2020-12-06 DIAGNOSIS — E7800 Pure hypercholesterolemia, unspecified: Secondary | ICD-10-CM | POA: Diagnosis not present

## 2020-12-06 DIAGNOSIS — N400 Enlarged prostate without lower urinary tract symptoms: Secondary | ICD-10-CM | POA: Diagnosis not present

## 2020-12-06 DIAGNOSIS — F322 Major depressive disorder, single episode, severe without psychotic features: Secondary | ICD-10-CM | POA: Diagnosis not present

## 2020-12-06 DIAGNOSIS — F329 Major depressive disorder, single episode, unspecified: Secondary | ICD-10-CM | POA: Diagnosis not present

## 2020-12-06 DIAGNOSIS — H409 Unspecified glaucoma: Secondary | ICD-10-CM | POA: Diagnosis not present

## 2020-12-06 DIAGNOSIS — G459 Transient cerebral ischemic attack, unspecified: Secondary | ICD-10-CM | POA: Diagnosis not present

## 2020-12-15 DIAGNOSIS — E7800 Pure hypercholesterolemia, unspecified: Secondary | ICD-10-CM | POA: Diagnosis not present

## 2020-12-15 DIAGNOSIS — H409 Unspecified glaucoma: Secondary | ICD-10-CM | POA: Diagnosis not present

## 2020-12-15 DIAGNOSIS — F329 Major depressive disorder, single episode, unspecified: Secondary | ICD-10-CM | POA: Diagnosis not present

## 2020-12-15 DIAGNOSIS — G459 Transient cerebral ischemic attack, unspecified: Secondary | ICD-10-CM | POA: Diagnosis not present

## 2020-12-15 DIAGNOSIS — N400 Enlarged prostate without lower urinary tract symptoms: Secondary | ICD-10-CM | POA: Diagnosis not present

## 2021-01-21 DIAGNOSIS — Z961 Presence of intraocular lens: Secondary | ICD-10-CM | POA: Diagnosis not present

## 2021-01-21 DIAGNOSIS — H35371 Puckering of macula, right eye: Secondary | ICD-10-CM | POA: Diagnosis not present

## 2021-01-21 DIAGNOSIS — H43813 Vitreous degeneration, bilateral: Secondary | ICD-10-CM | POA: Diagnosis not present

## 2021-01-21 DIAGNOSIS — H4010X2 Unspecified open-angle glaucoma, moderate stage: Secondary | ICD-10-CM | POA: Diagnosis not present

## 2021-02-26 DIAGNOSIS — H409 Unspecified glaucoma: Secondary | ICD-10-CM | POA: Diagnosis not present

## 2021-02-26 DIAGNOSIS — F329 Major depressive disorder, single episode, unspecified: Secondary | ICD-10-CM | POA: Diagnosis not present

## 2021-02-26 DIAGNOSIS — E559 Vitamin D deficiency, unspecified: Secondary | ICD-10-CM | POA: Diagnosis not present

## 2021-02-26 DIAGNOSIS — F322 Major depressive disorder, single episode, severe without psychotic features: Secondary | ICD-10-CM | POA: Diagnosis not present

## 2021-02-26 DIAGNOSIS — E7800 Pure hypercholesterolemia, unspecified: Secondary | ICD-10-CM | POA: Diagnosis not present

## 2021-02-26 DIAGNOSIS — F09 Unspecified mental disorder due to known physiological condition: Secondary | ICD-10-CM | POA: Diagnosis not present

## 2021-02-26 DIAGNOSIS — G459 Transient cerebral ischemic attack, unspecified: Secondary | ICD-10-CM | POA: Diagnosis not present

## 2021-02-26 DIAGNOSIS — N400 Enlarged prostate without lower urinary tract symptoms: Secondary | ICD-10-CM | POA: Diagnosis not present

## 2021-03-03 DIAGNOSIS — E7800 Pure hypercholesterolemia, unspecified: Secondary | ICD-10-CM | POA: Diagnosis not present

## 2021-03-03 DIAGNOSIS — F322 Major depressive disorder, single episode, severe without psychotic features: Secondary | ICD-10-CM | POA: Diagnosis not present

## 2021-03-03 DIAGNOSIS — G459 Transient cerebral ischemic attack, unspecified: Secondary | ICD-10-CM | POA: Diagnosis not present

## 2021-03-03 DIAGNOSIS — N400 Enlarged prostate without lower urinary tract symptoms: Secondary | ICD-10-CM | POA: Diagnosis not present

## 2021-03-03 DIAGNOSIS — F329 Major depressive disorder, single episode, unspecified: Secondary | ICD-10-CM | POA: Diagnosis not present

## 2021-03-03 DIAGNOSIS — H409 Unspecified glaucoma: Secondary | ICD-10-CM | POA: Diagnosis not present

## 2021-06-01 DIAGNOSIS — E7800 Pure hypercholesterolemia, unspecified: Secondary | ICD-10-CM | POA: Diagnosis not present

## 2021-06-01 DIAGNOSIS — G459 Transient cerebral ischemic attack, unspecified: Secondary | ICD-10-CM | POA: Diagnosis not present

## 2021-06-01 DIAGNOSIS — H409 Unspecified glaucoma: Secondary | ICD-10-CM | POA: Diagnosis not present

## 2021-06-01 DIAGNOSIS — F329 Major depressive disorder, single episode, unspecified: Secondary | ICD-10-CM | POA: Diagnosis not present

## 2021-06-01 DIAGNOSIS — N400 Enlarged prostate without lower urinary tract symptoms: Secondary | ICD-10-CM | POA: Diagnosis not present

## 2021-06-01 DIAGNOSIS — F322 Major depressive disorder, single episode, severe without psychotic features: Secondary | ICD-10-CM | POA: Diagnosis not present

## 2021-07-20 DIAGNOSIS — Z961 Presence of intraocular lens: Secondary | ICD-10-CM | POA: Diagnosis not present

## 2021-07-20 DIAGNOSIS — H4010X2 Unspecified open-angle glaucoma, moderate stage: Secondary | ICD-10-CM | POA: Diagnosis not present

## 2021-09-03 DIAGNOSIS — F329 Major depressive disorder, single episode, unspecified: Secondary | ICD-10-CM | POA: Diagnosis not present

## 2021-09-03 DIAGNOSIS — H409 Unspecified glaucoma: Secondary | ICD-10-CM | POA: Diagnosis not present

## 2021-09-03 DIAGNOSIS — N400 Enlarged prostate without lower urinary tract symptoms: Secondary | ICD-10-CM | POA: Diagnosis not present

## 2021-09-03 DIAGNOSIS — F322 Major depressive disorder, single episode, severe without psychotic features: Secondary | ICD-10-CM | POA: Diagnosis not present

## 2021-09-03 DIAGNOSIS — G459 Transient cerebral ischemic attack, unspecified: Secondary | ICD-10-CM | POA: Diagnosis not present

## 2021-09-03 DIAGNOSIS — E7800 Pure hypercholesterolemia, unspecified: Secondary | ICD-10-CM | POA: Diagnosis not present

## 2021-09-29 ENCOUNTER — Ambulatory Visit
Admission: RE | Admit: 2021-09-29 | Discharge: 2021-09-29 | Disposition: A | Discharge status: Home / Self Care | Payer: PPO | Source: Ambulatory Visit | Attending: Internal Medicine | Admitting: Internal Medicine

## 2021-09-29 ENCOUNTER — Other Ambulatory Visit: Payer: Self-pay | Admitting: Internal Medicine

## 2021-09-29 DIAGNOSIS — M47814 Spondylosis without myelopathy or radiculopathy, thoracic region: Secondary | ICD-10-CM | POA: Diagnosis not present

## 2021-09-29 DIAGNOSIS — G459 Transient cerebral ischemic attack, unspecified: Secondary | ICD-10-CM | POA: Diagnosis not present

## 2021-09-29 DIAGNOSIS — F329 Major depressive disorder, single episode, unspecified: Secondary | ICD-10-CM | POA: Diagnosis not present

## 2021-09-29 DIAGNOSIS — H409 Unspecified glaucoma: Secondary | ICD-10-CM | POA: Diagnosis not present

## 2021-09-29 DIAGNOSIS — J690 Pneumonitis due to inhalation of food and vomit: Secondary | ICD-10-CM

## 2021-09-29 DIAGNOSIS — E559 Vitamin D deficiency, unspecified: Secondary | ICD-10-CM | POA: Diagnosis not present

## 2021-09-29 DIAGNOSIS — F322 Major depressive disorder, single episode, severe without psychotic features: Secondary | ICD-10-CM | POA: Diagnosis not present

## 2021-09-29 DIAGNOSIS — F09 Unspecified mental disorder due to known physiological condition: Secondary | ICD-10-CM | POA: Diagnosis not present

## 2021-09-29 DIAGNOSIS — N400 Enlarged prostate without lower urinary tract symptoms: Secondary | ICD-10-CM | POA: Diagnosis not present

## 2021-09-29 DIAGNOSIS — Z0000 Encounter for general adult medical examination without abnormal findings: Secondary | ICD-10-CM | POA: Diagnosis not present

## 2021-09-29 DIAGNOSIS — E7800 Pure hypercholesterolemia, unspecified: Secondary | ICD-10-CM | POA: Diagnosis not present

## 2021-09-29 DIAGNOSIS — Z1389 Encounter for screening for other disorder: Secondary | ICD-10-CM | POA: Diagnosis not present

## 2021-10-12 DIAGNOSIS — N419 Inflammatory disease of prostate, unspecified: Secondary | ICD-10-CM | POA: Diagnosis not present

## 2021-10-27 DIAGNOSIS — N390 Urinary tract infection, site not specified: Secondary | ICD-10-CM | POA: Diagnosis not present

## 2021-11-06 ENCOUNTER — Emergency Department (HOSPITAL_COMMUNITY)
Admission: EM | Admit: 2021-11-06 | Discharge: 2021-11-07 | Disposition: A | Discharge status: Home / Self Care | Payer: PPO | Attending: Emergency Medicine | Admitting: Emergency Medicine

## 2021-11-06 ENCOUNTER — Other Ambulatory Visit: Payer: Self-pay

## 2021-11-06 ENCOUNTER — Encounter (HOSPITAL_COMMUNITY): Payer: Self-pay

## 2021-11-06 ENCOUNTER — Emergency Department (HOSPITAL_COMMUNITY): Payer: PPO

## 2021-11-06 DIAGNOSIS — R109 Unspecified abdominal pain: Secondary | ICD-10-CM | POA: Diagnosis not present

## 2021-11-06 DIAGNOSIS — M549 Dorsalgia, unspecified: Secondary | ICD-10-CM | POA: Diagnosis not present

## 2021-11-06 DIAGNOSIS — M4316 Spondylolisthesis, lumbar region: Secondary | ICD-10-CM | POA: Diagnosis not present

## 2021-11-06 DIAGNOSIS — G8929 Other chronic pain: Secondary | ICD-10-CM | POA: Insufficient documentation

## 2021-11-06 DIAGNOSIS — R531 Weakness: Secondary | ICD-10-CM | POA: Insufficient documentation

## 2021-11-06 DIAGNOSIS — M546 Pain in thoracic spine: Secondary | ICD-10-CM | POA: Diagnosis not present

## 2021-11-06 DIAGNOSIS — N189 Chronic kidney disease, unspecified: Secondary | ICD-10-CM | POA: Insufficient documentation

## 2021-11-06 DIAGNOSIS — R4182 Altered mental status, unspecified: Secondary | ICD-10-CM | POA: Diagnosis not present

## 2021-11-06 DIAGNOSIS — Z20822 Contact with and (suspected) exposure to covid-19: Secondary | ICD-10-CM | POA: Insufficient documentation

## 2021-11-06 DIAGNOSIS — M545 Low back pain, unspecified: Secondary | ICD-10-CM | POA: Diagnosis not present

## 2021-11-06 DIAGNOSIS — I6782 Cerebral ischemia: Secondary | ICD-10-CM | POA: Diagnosis not present

## 2021-11-06 DIAGNOSIS — F0390 Unspecified dementia without behavioral disturbance: Secondary | ICD-10-CM | POA: Insufficient documentation

## 2021-11-06 DIAGNOSIS — R410 Disorientation, unspecified: Secondary | ICD-10-CM | POA: Diagnosis not present

## 2021-11-06 DIAGNOSIS — R32 Unspecified urinary incontinence: Secondary | ICD-10-CM | POA: Insufficient documentation

## 2021-11-06 DIAGNOSIS — G319 Degenerative disease of nervous system, unspecified: Secondary | ICD-10-CM | POA: Diagnosis not present

## 2021-11-06 DIAGNOSIS — R000 Tachycardia, unspecified: Secondary | ICD-10-CM | POA: Diagnosis not present

## 2021-11-06 DIAGNOSIS — R509 Fever, unspecified: Secondary | ICD-10-CM

## 2021-11-06 DIAGNOSIS — R29818 Other symptoms and signs involving the nervous system: Secondary | ICD-10-CM | POA: Diagnosis not present

## 2021-11-06 DIAGNOSIS — M6281 Muscle weakness (generalized): Secondary | ICD-10-CM | POA: Diagnosis not present

## 2021-11-06 HISTORY — DX: Depression, unspecified: F32.A

## 2021-11-06 HISTORY — DX: Unspecified dementia, unspecified severity, without behavioral disturbance, psychotic disturbance, mood disturbance, and anxiety: F03.90

## 2021-11-06 LAB — CBC WITH DIFFERENTIAL/PLATELET
Abs Immature Granulocytes: 0.05 10*3/uL (ref 0.00–0.07)
Basophils Absolute: 0 10*3/uL (ref 0.0–0.1)
Basophils Relative: 0 %
Eosinophils Absolute: 0 10*3/uL (ref 0.0–0.5)
Eosinophils Relative: 0 %
HCT: 46.6 % (ref 39.0–52.0)
Hemoglobin: 15.2 g/dL (ref 13.0–17.0)
Immature Granulocytes: 1 %
Lymphocytes Relative: 8 %
Lymphs Abs: 0.8 10*3/uL (ref 0.7–4.0)
MCH: 32.8 pg (ref 26.0–34.0)
MCHC: 32.6 g/dL (ref 30.0–36.0)
MCV: 100.6 fL — ABNORMAL HIGH (ref 80.0–100.0)
Monocytes Absolute: 1.1 10*3/uL — ABNORMAL HIGH (ref 0.1–1.0)
Monocytes Relative: 11 %
Neutro Abs: 8.2 10*3/uL — ABNORMAL HIGH (ref 1.7–7.7)
Neutrophils Relative %: 80 %
Platelets: 168 10*3/uL (ref 150–400)
RBC: 4.63 MIL/uL (ref 4.22–5.81)
RDW: 12.9 % (ref 11.5–15.5)
WBC: 10.3 10*3/uL (ref 4.0–10.5)
nRBC: 0 % (ref 0.0–0.2)

## 2021-11-06 LAB — COMPREHENSIVE METABOLIC PANEL
ALT: 16 U/L (ref 0–44)
AST: 21 U/L (ref 15–41)
Albumin: 4.3 g/dL (ref 3.5–5.0)
Alkaline Phosphatase: 88 U/L (ref 38–126)
Anion gap: 7 (ref 5–15)
BUN: 19 mg/dL (ref 8–23)
CO2: 26 mmol/L (ref 22–32)
Calcium: 9.4 mg/dL (ref 8.9–10.3)
Chloride: 103 mmol/L (ref 98–111)
Creatinine, Ser: 1.27 mg/dL — ABNORMAL HIGH (ref 0.61–1.24)
GFR, Estimated: 55 mL/min — ABNORMAL LOW (ref >60)
Glucose, Bld: 133 mg/dL — ABNORMAL HIGH (ref 70–99)
Potassium: 4.3 mmol/L (ref 3.5–5.1)
Sodium: 136 mmol/L (ref 135–145)
Total Bilirubin: 0.6 mg/dL (ref 0.3–1.2)
Total Protein: 7.5 g/dL (ref 6.5–8.1)

## 2021-11-06 LAB — MAGNESIUM: Magnesium: 2 mg/dL (ref 1.7–2.4)

## 2021-11-06 MED ORDER — SODIUM CHLORIDE 0.9 % IV BOLUS
500.0000 mL | Freq: Once | INTRAVENOUS | Status: AC
  Administered 2021-11-07: 500 mL via INTRAVENOUS

## 2021-11-06 NOTE — ED Provider Triage Note (Signed)
Emergency Medicine Provider Triage Evaluation Note  Chad Holden , a 88 y.o. male  was evaluated in triage.  Pt presents with his wife for evaluation of weakness.  Weakness has been present all day.  Wife states he has been moving much slower.  She states she thinks his left leg is more weak compared to the right.  States he needed help getting in and out of the car multiple times today which is unusual for him.  States he is more dazed.  States he also had a couple falls in the past 2 to 3 months most recently 3 weeks ago.  3 weeks ago he had an unwitnessed fall and was not evaluated after it.  Patient has dementia is unable to provide history.   Review of Systems  Positive: As above Negative: As above  Physical Exam  BP 131/81 (BP Location: Right Arm)    Pulse (!) 118    Temp 99.1 F (37.3 C) (Oral)    Resp 20    Ht 5' 10.5" (1.791 m)    Wt 93 kg    SpO2 97%    BMI 29.00 kg/m  Gen:   Awake, no distress   Resp:  Normal effort  MSK:   Moves extremities without difficulty  Other:  Good movement in upper and lower extremities bilaterally.  Good grip strength bilaterally.  Cranial nerves III through XII intact.  Without facial droop.  Medical Decision Making  Medically screening exam initiated at 10:25 PM.  Appropriate orders placed.  JAMONIE MENSE was informed that the remainder of the evaluation will be completed by another provider, this initial triage assessment does not replace that evaluation, and the importance of remaining in the ED until their evaluation is complete.     Evlyn Courier, PA-C 11/06/21 2227

## 2021-11-06 NOTE — ED Triage Notes (Signed)
Patient is having weakness in his left leg that has been going on all day when he woke up at 7am. Some weakness in his right leg. Patient has been more confused. Patient has dementia. Wife said he has been more withdrawn today. Patient has strong grip strength in both arms and legs. Able to hold all extremities up for 10 seconds without dropping. Patient able to tell me his name and the president.

## 2021-11-06 NOTE — ED Provider Notes (Signed)
[facility]-EMERGENCY DEPT Provider Note   CSN: [id number] Arrival date & time: 11/06/21  2159     History  Chief Complaint  Patient presents with   Fatigue    Chad Holden is a 88 y.o. male.  The history is provided by the patient, a relative and medical records.  Chad Holden is a 88 y.o. male who presents to the Emergency Department complaining of weakness.  He presents to the emergency department accompanied by his wife and son for evaluation of weakness that started this morning upon waking.  He lives with his wife and at baseline is slow to walk but walks independently.  She states that upon waking this morning he has been unable to ambulate and get up without assistance.  He is very weak in bilateral lower extremities, left greater than right.  No reports of recent injuries.  No recent illnesses.  He does complain of chronic back pain, predominantly on the right side.  No fevers, chest pain, abdominal pain, dysuria.  He does have chronic urinary incontinence.  He has a history of glaucoma, dementia, depression, incontinence, CKD.  No history of cancer.      Home Medications Prior to Admission medications   Medication Sig Start Date End Date Taking? Authorizing Provider  atorvastatin (LIPITOR) 40 MG tablet Take 1 tablet (40 mg total) by mouth daily. 02/01/18 02/01/19  Darlin Drop, DO  cephALEXin (KEFLEX) 500 MG capsule Take 1 capsule (500 mg total) by mouth 3 (three) times daily. 04/16/18   Dione Booze, MD  dorzolamide-timolol (COSOPT) 22.3-6.8 MG/ML ophthalmic solution PLACE 1 DROP IN [facility] EYE EVERY 12 HOURS 10/23/15   [provider]  latanoprost (XALATAN) 0.005 % ophthalmic solution Place 1 drop into both eyes at bedtime.  12/18/15   [provider]  mirabegron ER (MYRBETRIQ) 50 MG TB24 tablet Take 50 mg by mouth every evening.  04/26/16   [provider]  tamsulosin (FLOMAX) 0.4 MG CAPS capsule Take 1 capsule (0.4 mg total) by  mouth daily. Use for urinary retention. Patient taking differently: Take 0.4 mg by mouth every evening. Use for urinary retention. 12/27/15   Edsel Petrin, DO      Allergies    Hydrocodone    Review of Systems   Review of Systems  All other systems reviewed and are negative.  Physical Exam Updated Vital Signs BP 130/88    Pulse 95    Temp 99.1 F (37.3 C) (Oral)    Resp (!) 27    Ht 5' 10.5" (1.791 m)    Wt 93 kg    SpO2 97%    BMI 29.00 kg/m  Physical Exam Vitals and nursing note reviewed.  Constitutional:      Appearance: He is well-developed.  HENT:     Head: Normocephalic and atraumatic.  Cardiovascular:     Rate and Rhythm: Regular rhythm. Tachycardia present.     Heart sounds: No murmur heard. Pulmonary:     Effort: Pulmonary effort is normal. No respiratory distress.     Breath sounds: Normal breath sounds.  Abdominal:     Palpations: Abdomen is soft.     Tenderness: There is no abdominal tenderness. There is no guarding or rebound.  Musculoskeletal:        General: No tenderness.     Comments: 2+ DP pulses bilaterally  Skin:    General: Skin is warm and dry.  Neurological:     Mental Status: He is alert and  oriented to person, place, and time.     Comments: Slow to answer questions.  4+ out of 5 strength in bilateral upper extremities.  4-5 strength in proximal and distal right lower extremity.  2 out of 5 strength in the proximal left lower extremity, 3 out of 5 strength in the distal left lower extremity.  Sensation to light touch intact in all 4 extremities.  No asymmetry of facial movements.  Brisk patellar reflexes bilaterally  Psychiatric:        Behavior: Behavior normal.    ED Results / Procedures / Treatments   Labs (all labs ordered are listed, but only abnormal results are displayed) Labs Reviewed  CBC WITH DIFFERENTIAL/PLATELET - Abnormal; Notable for the following components:      Result Value   MCV 100.6 (*)    Neutro Abs 8.2 (*)     Monocytes Absolute 1.1 (*)    All other components within normal limits  COMPREHENSIVE METABOLIC PANEL - Abnormal; Notable for the following components:   Glucose, Bld 133 (*)    Creatinine, Ser 1.27 (*)    GFR, Estimated 55 (*)    All other components within normal limits  RESP PANEL BY RT-PCR (FLU A&B, COVID) ARPGX2  URINE CULTURE  MAGNESIUM  URINALYSIS, ROUTINE W REFLEX MICROSCOPIC    EKG EKG Interpretation  Date/Time:  Friday November 06 2021 23:29:07 EST Ventricular Rate:  102 PR Interval:  130 QRS Duration: 90 QT Interval:  326 QTC Calculation: 425 R Axis:   26 Text Interpretation: Sinus tachycardia Abnormal R-wave progression, early transition Confirmed by Tilden Fossa [id number] on 11/06/2021 11:36:24 PM  Radiology CT Abdomen Pelvis Wo Contrast  Result Date: 11/07/2021 CLINICAL DATA:  Flank pain, back pain, worse on the left EXAM: CT ABDOMEN AND PELVIS WITHOUT CONTRAST TECHNIQUE: Multidetector CT imaging of the abdomen and pelvis was performed following the standard protocol without IV contrast. RADIATION DOSE REDUCTION: This exam was performed according to the departmental dose-optimization program which includes automated exposure control, adjustment of the mA and/or kV according to patient size and/or use of iterative reconstruction technique. COMPARISON:  12/24/2015 FINDINGS: Lower chest: Lung bases are clear. No effusions. Heart is normal size. Hepatobiliary: No focal hepatic abnormality. Gallbladder unremarkable. Pancreas: No focal abnormality or ductal dilatation. Spleen: No focal abnormality.  Normal size. Adrenals/Urinary Tract: Large cyst off the lower pole of the left kidney measures 5.7 cm. No hydronephrosis. No renal or ureteral stones. Adrenal glands and urinary bladder unremarkable. Stomach/Bowel: Large right inguinal hernia containing a loop of sigmoid colon. No evidence of bowel obstruction. Stomach and small bowel decompressed, grossly unremarkable.  Vascular/Lymphatic: Aortoiliac atherosclerosis. No evidence of aneurysm or adenopathy. Reproductive: Prostate enlargement. Other: No free fluid or free air. Musculoskeletal: No acute bony abnormality. IMPRESSION: Large right inguinal hernia containing sigmoid colon loop. No evidence of bowel obstruction. Aortic atherosclerosis. Prostate enlargement. Electronically Signed   By: Charlett Nose M.D.   On: 11/07/2021 02:26   DG Chest 2 View  Result Date: 11/06/2021 CLINICAL DATA:  Left lower extremity weakness, increased confusion. EXAM: CHEST - 2 VIEW COMPARISON:  PA Lat 09/29/2021. FINDINGS: The lungs clear of infiltrates. There is increased left perihilar linear atelectasis. Mild chronic elevation right hemidiaphragm. No pleural effusion is seen. Heart size and central vessels are normal. There is aortic atherosclerosis and tortuosity with stable mediastinal configuration. Osteopenia again noted with degenerative disc disease and spondylosis of the thoracic spine. IMPRESSION: No evidence of acute chest disease.  Aortic atherosclerosis. Electronically Signed  By: Almira Bar M.D.   On: 11/06/2021 23:07   CT Head Wo Contrast  Result Date: 11/06/2021 CLINICAL DATA:  Mental status change unknown etiology. Generalized weakness and decreased mobility. EXAM: CT HEAD WITHOUT CONTRAST TECHNIQUE: Contiguous axial images were obtained from the base of the skull through the vertex without intravenous contrast. RADIATION DOSE REDUCTION: This exam was performed according to the departmental dose-optimization program which includes automated exposure control, adjustment of the mA and/or kV according to patient size and/or use of iterative reconstruction technique. COMPARISON:  Head CT 01/30/2018, MRI brain 01/31/2018. FINDINGS: Brain: There is moderately advanced cerebral atrophy, small vessel disease and atrophic ventriculomegaly versus normal pressure hydrocephalus. There is mild-to-moderate cerebellar atrophy. There is  no midline shift. No focal asymmetry is seen concerning for acute cortical based infarct, hemorrhage, mass or mass effect. The basal cisterns are clear. Vascular: Calcifications in the distal vertebral arteries and carotid siphons but no hyperdense central vasculature. Skull: The calvarium, skull base and orbits are intact with no focal skull lesions. Sinuses/Orbits: There is mild membrane disease in the paranasal sinuses without fluid levels or mastoid effusion. Old lens replacements. Other: None. IMPRESSION: No acute intracranial CT findings or interval changes. There is atrophy with fairly proportionate atrophic ventriculomegaly, versus normal pressure hydrocephalus, unchanged appearance since 2019 CT and MRI. Sinus disease. Electronically Signed   By: Almira Bar M.D.   On: 11/06/2021 23:14    Procedures Procedures    Medications Ordered in ED Medications  sodium chloride 0.9 % bolus 500 mL (0 mLs Intravenous Stopped 11/07/21 0304)    ED Course/ Medical Decision Making/ A&P                           Medical Decision Making Amount and/or Complexity of Data Reviewed Labs: ordered. Radiology: ordered.   Patient with history of dementia, CKD here for evaluation of weakness to bilateral lower extremities, left greater than right.  He does have significant weakness on examination that is predominantly in the left lower extremity.  BMP with stable renal insufficiency.  CT abdomen pelvis was obtained given patient's report of flank pain.  CT is negative for acute abnormality.  Given his weakness plan to obtain MRI to further evaluate his weakness.  UA not consistent with UTI.  Patient care transferred pending additional imaging.        Final Clinical Impression(s) / ED Diagnoses Final diagnoses:  None    Rx / DC Orders ED Discharge Orders     None         Tilden Fossa, MD 11/07/21 [phone]

## 2021-11-07 ENCOUNTER — Emergency Department (HOSPITAL_COMMUNITY): Payer: PPO

## 2021-11-07 DIAGNOSIS — R29818 Other symptoms and signs involving the nervous system: Secondary | ICD-10-CM | POA: Diagnosis not present

## 2021-11-07 DIAGNOSIS — M546 Pain in thoracic spine: Secondary | ICD-10-CM | POA: Diagnosis not present

## 2021-11-07 DIAGNOSIS — M545 Low back pain, unspecified: Secondary | ICD-10-CM | POA: Diagnosis not present

## 2021-11-07 DIAGNOSIS — M4316 Spondylolisthesis, lumbar region: Secondary | ICD-10-CM | POA: Diagnosis not present

## 2021-11-07 DIAGNOSIS — G319 Degenerative disease of nervous system, unspecified: Secondary | ICD-10-CM | POA: Diagnosis not present

## 2021-11-07 DIAGNOSIS — F0390 Unspecified dementia without behavioral disturbance: Secondary | ICD-10-CM | POA: Diagnosis not present

## 2021-11-07 DIAGNOSIS — I6782 Cerebral ischemia: Secondary | ICD-10-CM | POA: Diagnosis not present

## 2021-11-07 DIAGNOSIS — R109 Unspecified abdominal pain: Secondary | ICD-10-CM | POA: Diagnosis not present

## 2021-11-07 LAB — URINALYSIS, ROUTINE W REFLEX MICROSCOPIC
Bacteria, UA: NONE SEEN
Bilirubin Urine: NEGATIVE
Glucose, UA: NEGATIVE mg/dL
Ketones, ur: NEGATIVE mg/dL
Leukocytes,Ua: NEGATIVE
Nitrite: NEGATIVE
Protein, ur: NEGATIVE mg/dL
Specific Gravity, Urine: 1.015 (ref 1.005–1.030)
pH: 5 (ref 5.0–8.0)

## 2021-11-07 LAB — RESP PANEL BY RT-PCR (FLU A&B, COVID) ARPGX2
Influenza A by PCR: NEGATIVE
Influenza B by PCR: NEGATIVE
SARS Coronavirus 2 by RT PCR: NEGATIVE

## 2021-11-07 MED ORDER — AMOXICILLIN-POT CLAVULANATE 875-125 MG PO TABS
1.0000 | ORAL_TABLET | Freq: Two times a day (BID) | ORAL | 0 refills | Status: AC
Start: 2021-11-07 — End: ?

## 2021-11-07 MED ORDER — KETOROLAC TROMETHAMINE 15 MG/ML IJ SOLN
15.0000 mg | Freq: Once | INTRAMUSCULAR | Status: AC
  Administered 2021-11-07: 15 mg via INTRAVENOUS
  Filled 2021-11-07: qty 1

## 2021-11-07 MED ORDER — ACETAMINOPHEN 500 MG PO TABS
1000.0000 mg | ORAL_TABLET | Freq: Once | ORAL | Status: AC
Start: 2021-11-07 — End: 2021-11-07
  Administered 2021-11-07: 1000 mg via ORAL
  Filled 2021-11-07: qty 2

## 2021-11-07 MED ORDER — GADOBUTROL 1 MMOL/ML IV SOLN
9.0000 mL | Freq: Once | INTRAVENOUS | Status: AC | PRN
  Administered 2021-11-07: 9 mL via INTRAVENOUS

## 2021-11-07 MED ORDER — AMOXICILLIN-POT CLAVULANATE 875-125 MG PO TABS: 1.0000 | ORAL_TABLET | Freq: Two times a day (BID) | ORAL | 0 refills | Status: DC

## 2021-11-07 MED ORDER — LACTATED RINGERS IV BOLUS
500.0000 mL | Freq: Once | INTRAVENOUS | Status: AC
  Administered 2021-11-07: 500 mL via INTRAVENOUS

## 2021-11-07 NOTE — ED Notes (Signed)
Transported to MRI via stretcher.

## 2021-11-07 NOTE — Discharge Instructions (Addendum)
All the imaging today was normal.  No source of infection but he did have a temperature of 100.7 earlier.  If he starts having worsening cough, daily fever you can start the antibiotic but if not you can discard it.  Continue to take his Tylenol and ibuprofen that he uses for his back pain make sure he staying well-hydrated.  Use the walker as needed which you can obtain at a medical supply store.

## 2021-11-07 NOTE — ED Provider Notes (Signed)
I assumed care from Dr. Madilyn Hook at 8 AM this morning.  Patient presenting overnight with weakness, inability to walk.  Dr. Madilyn Hook noted localized left lower extremity weakness and inability to lift his leg off the bed.  Overnight patient had had labs including a UA, COVID panel, CBC, CMP, magnesium, head CT, CT of the abdomen pelvis and chest x-ray all which were negative.  Patient was pending MRI of the spine and brain.  Prior to going to MRI patient did spike a fever of 100.7 was treated he was given a small bolus of fluid.  MRI was changed to with and without to ensure no evidence of epidural abscess, osteomyelitis or other acute spinal processes on my repeat evaluation patient was complaining of having back pain.  MRI of the brain today which I independently viewed and radiology interpreted showed no acute findings today.  MRI with and without contrast of the thoracic and lumbar spine were negative for acute disease but does have chronic arthritic findings.  On repeat evaluation patient is moving both legs and able to lift them off the bed independently.  His wife reports that he typically walks at home without a walker or cane.  He is complaining of significant back pain and he was given medication.  His wife reports he does not tolerate opiate medications and that was avoided.  After receiving pain medication he was able to get out of the bed and walk which wife reports is at his baseline.  Do feel that he may benefit from a walker and they were given a prescription for 1.  Home health was also ordered for physical therapy and evaluation.  Patient's wife and son are present and findings were discussed.  They do say that he has had a mild cough over the last few days this is most likely viral in nature and may be the cause of his weakness however they did receive a prescription for an antibiotic to Start if patient starts having more persistent fever, productive cough and they were encouraged to follow-up with her  PCP early this week or return to the emergency room if his symptoms worsen.  At this time patient does not meet admission criteria.  He is well-appearing on exam with normal vital signs.  No social determinants affecting his discharge today.   Gwyneth Sprout, MD 11/07/21 [phone]

## 2021-11-08 LAB — URINE CULTURE: Culture: NO GROWTH

## 2021-11-10 DIAGNOSIS — F09 Unspecified mental disorder due to known physiological condition: Secondary | ICD-10-CM | POA: Diagnosis not present

## 2021-11-10 DIAGNOSIS — M48061 Spinal stenosis, lumbar region without neurogenic claudication: Secondary | ICD-10-CM | POA: Diagnosis not present

## 2021-11-14 DIAGNOSIS — Z9181 History of falling: Secondary | ICD-10-CM | POA: Diagnosis not present

## 2021-11-14 DIAGNOSIS — M48061 Spinal stenosis, lumbar region without neurogenic claudication: Secondary | ICD-10-CM | POA: Diagnosis not present

## 2021-11-14 DIAGNOSIS — Z79899 Other long term (current) drug therapy: Secondary | ICD-10-CM | POA: Diagnosis not present

## 2021-11-14 DIAGNOSIS — E7800 Pure hypercholesterolemia, unspecified: Secondary | ICD-10-CM | POA: Diagnosis not present

## 2021-11-14 DIAGNOSIS — E559 Vitamin D deficiency, unspecified: Secondary | ICD-10-CM | POA: Diagnosis not present

## 2021-11-14 DIAGNOSIS — Z7982 Long term (current) use of aspirin: Secondary | ICD-10-CM | POA: Diagnosis not present

## 2021-11-14 DIAGNOSIS — N401 Enlarged prostate with lower urinary tract symptoms: Secondary | ICD-10-CM | POA: Diagnosis not present

## 2021-11-14 DIAGNOSIS — H409 Unspecified glaucoma: Secondary | ICD-10-CM | POA: Diagnosis not present

## 2021-11-14 DIAGNOSIS — R4189 Other symptoms and signs involving cognitive functions and awareness: Secondary | ICD-10-CM | POA: Diagnosis not present

## 2021-11-14 DIAGNOSIS — F329 Major depressive disorder, single episode, unspecified: Secondary | ICD-10-CM | POA: Diagnosis not present

## 2021-11-14 DIAGNOSIS — Z8673 Personal history of transient ischemic attack (TIA), and cerebral infarction without residual deficits: Secondary | ICD-10-CM | POA: Diagnosis not present

## 2021-11-14 DIAGNOSIS — N3941 Urge incontinence: Secondary | ICD-10-CM | POA: Diagnosis not present

## 2021-11-14 DIAGNOSIS — R351 Nocturia: Secondary | ICD-10-CM | POA: Diagnosis not present

## 2021-11-14 DIAGNOSIS — Z791 Long term (current) use of non-steroidal anti-inflammatories (NSAID): Secondary | ICD-10-CM | POA: Diagnosis not present

## 2021-11-14 DIAGNOSIS — Z87440 Personal history of urinary (tract) infections: Secondary | ICD-10-CM | POA: Diagnosis not present

## 2021-12-01 DIAGNOSIS — Z8673 Personal history of transient ischemic attack (TIA), and cerebral infarction without residual deficits: Secondary | ICD-10-CM | POA: Diagnosis not present

## 2021-12-01 DIAGNOSIS — E559 Vitamin D deficiency, unspecified: Secondary | ICD-10-CM | POA: Diagnosis not present

## 2021-12-01 DIAGNOSIS — Z79899 Other long term (current) drug therapy: Secondary | ICD-10-CM | POA: Diagnosis not present

## 2021-12-01 DIAGNOSIS — R351 Nocturia: Secondary | ICD-10-CM | POA: Diagnosis not present

## 2021-12-01 DIAGNOSIS — N3941 Urge incontinence: Secondary | ICD-10-CM | POA: Diagnosis not present

## 2021-12-01 DIAGNOSIS — M48061 Spinal stenosis, lumbar region without neurogenic claudication: Secondary | ICD-10-CM | POA: Diagnosis not present

## 2021-12-01 DIAGNOSIS — N401 Enlarged prostate with lower urinary tract symptoms: Secondary | ICD-10-CM | POA: Diagnosis not present

## 2021-12-01 DIAGNOSIS — F329 Major depressive disorder, single episode, unspecified: Secondary | ICD-10-CM | POA: Diagnosis not present

## 2021-12-01 DIAGNOSIS — E7800 Pure hypercholesterolemia, unspecified: Secondary | ICD-10-CM | POA: Diagnosis not present

## 2021-12-01 DIAGNOSIS — H409 Unspecified glaucoma: Secondary | ICD-10-CM | POA: Diagnosis not present

## 2021-12-01 DIAGNOSIS — Z7982 Long term (current) use of aspirin: Secondary | ICD-10-CM | POA: Diagnosis not present

## 2021-12-01 DIAGNOSIS — Z87440 Personal history of urinary (tract) infections: Secondary | ICD-10-CM | POA: Diagnosis not present

## 2021-12-01 DIAGNOSIS — Z791 Long term (current) use of non-steroidal anti-inflammatories (NSAID): Secondary | ICD-10-CM | POA: Diagnosis not present

## 2021-12-01 DIAGNOSIS — R4189 Other symptoms and signs involving cognitive functions and awareness: Secondary | ICD-10-CM | POA: Diagnosis not present

## 2021-12-01 DIAGNOSIS — Z9181 History of falling: Secondary | ICD-10-CM | POA: Diagnosis not present

## 2021-12-03 DIAGNOSIS — N401 Enlarged prostate with lower urinary tract symptoms: Secondary | ICD-10-CM | POA: Diagnosis not present

## 2021-12-03 DIAGNOSIS — F329 Major depressive disorder, single episode, unspecified: Secondary | ICD-10-CM | POA: Diagnosis not present

## 2021-12-03 DIAGNOSIS — H409 Unspecified glaucoma: Secondary | ICD-10-CM | POA: Diagnosis not present

## 2021-12-03 DIAGNOSIS — E7800 Pure hypercholesterolemia, unspecified: Secondary | ICD-10-CM | POA: Diagnosis not present

## 2021-12-03 DIAGNOSIS — Z791 Long term (current) use of non-steroidal anti-inflammatories (NSAID): Secondary | ICD-10-CM | POA: Diagnosis not present

## 2021-12-03 DIAGNOSIS — E559 Vitamin D deficiency, unspecified: Secondary | ICD-10-CM | POA: Diagnosis not present

## 2021-12-03 DIAGNOSIS — N3941 Urge incontinence: Secondary | ICD-10-CM | POA: Diagnosis not present

## 2021-12-03 DIAGNOSIS — R4189 Other symptoms and signs involving cognitive functions and awareness: Secondary | ICD-10-CM | POA: Diagnosis not present

## 2021-12-03 DIAGNOSIS — R351 Nocturia: Secondary | ICD-10-CM | POA: Diagnosis not present

## 2021-12-03 DIAGNOSIS — Z79899 Other long term (current) drug therapy: Secondary | ICD-10-CM | POA: Diagnosis not present

## 2021-12-03 DIAGNOSIS — Z87440 Personal history of urinary (tract) infections: Secondary | ICD-10-CM | POA: Diagnosis not present

## 2021-12-03 DIAGNOSIS — Z8673 Personal history of transient ischemic attack (TIA), and cerebral infarction without residual deficits: Secondary | ICD-10-CM | POA: Diagnosis not present

## 2021-12-03 DIAGNOSIS — Z9181 History of falling: Secondary | ICD-10-CM | POA: Diagnosis not present

## 2021-12-03 DIAGNOSIS — Z7982 Long term (current) use of aspirin: Secondary | ICD-10-CM | POA: Diagnosis not present

## 2021-12-03 DIAGNOSIS — M48061 Spinal stenosis, lumbar region without neurogenic claudication: Secondary | ICD-10-CM | POA: Diagnosis not present

## 2021-12-09 DIAGNOSIS — E7800 Pure hypercholesterolemia, unspecified: Secondary | ICD-10-CM | POA: Diagnosis not present

## 2021-12-09 DIAGNOSIS — F322 Major depressive disorder, single episode, severe without psychotic features: Secondary | ICD-10-CM | POA: Diagnosis not present

## 2021-12-11 DIAGNOSIS — Z791 Long term (current) use of non-steroidal anti-inflammatories (NSAID): Secondary | ICD-10-CM | POA: Diagnosis not present

## 2021-12-11 DIAGNOSIS — Z8673 Personal history of transient ischemic attack (TIA), and cerebral infarction without residual deficits: Secondary | ICD-10-CM | POA: Diagnosis not present

## 2021-12-11 DIAGNOSIS — H409 Unspecified glaucoma: Secondary | ICD-10-CM | POA: Diagnosis not present

## 2021-12-11 DIAGNOSIS — Z79899 Other long term (current) drug therapy: Secondary | ICD-10-CM | POA: Diagnosis not present

## 2021-12-11 DIAGNOSIS — N3941 Urge incontinence: Secondary | ICD-10-CM | POA: Diagnosis not present

## 2021-12-11 DIAGNOSIS — Z87440 Personal history of urinary (tract) infections: Secondary | ICD-10-CM | POA: Diagnosis not present

## 2021-12-11 DIAGNOSIS — R351 Nocturia: Secondary | ICD-10-CM | POA: Diagnosis not present

## 2021-12-11 DIAGNOSIS — N401 Enlarged prostate with lower urinary tract symptoms: Secondary | ICD-10-CM | POA: Diagnosis not present

## 2021-12-11 DIAGNOSIS — E7800 Pure hypercholesterolemia, unspecified: Secondary | ICD-10-CM | POA: Diagnosis not present

## 2021-12-11 DIAGNOSIS — Z9181 History of falling: Secondary | ICD-10-CM | POA: Diagnosis not present

## 2021-12-11 DIAGNOSIS — R4189 Other symptoms and signs involving cognitive functions and awareness: Secondary | ICD-10-CM | POA: Diagnosis not present

## 2021-12-11 DIAGNOSIS — Z7982 Long term (current) use of aspirin: Secondary | ICD-10-CM | POA: Diagnosis not present

## 2021-12-11 DIAGNOSIS — M48061 Spinal stenosis, lumbar region without neurogenic claudication: Secondary | ICD-10-CM | POA: Diagnosis not present

## 2021-12-11 DIAGNOSIS — F329 Major depressive disorder, single episode, unspecified: Secondary | ICD-10-CM | POA: Diagnosis not present

## 2021-12-11 DIAGNOSIS — E559 Vitamin D deficiency, unspecified: Secondary | ICD-10-CM | POA: Diagnosis not present

## 2021-12-16 DIAGNOSIS — E7800 Pure hypercholesterolemia, unspecified: Secondary | ICD-10-CM | POA: Diagnosis not present

## 2021-12-16 DIAGNOSIS — E559 Vitamin D deficiency, unspecified: Secondary | ICD-10-CM | POA: Diagnosis not present

## 2021-12-16 DIAGNOSIS — F329 Major depressive disorder, single episode, unspecified: Secondary | ICD-10-CM | POA: Diagnosis not present

## 2021-12-16 DIAGNOSIS — R4189 Other symptoms and signs involving cognitive functions and awareness: Secondary | ICD-10-CM | POA: Diagnosis not present

## 2021-12-16 DIAGNOSIS — Z8673 Personal history of transient ischemic attack (TIA), and cerebral infarction without residual deficits: Secondary | ICD-10-CM | POA: Diagnosis not present

## 2021-12-16 DIAGNOSIS — N401 Enlarged prostate with lower urinary tract symptoms: Secondary | ICD-10-CM | POA: Diagnosis not present

## 2021-12-16 DIAGNOSIS — Z79899 Other long term (current) drug therapy: Secondary | ICD-10-CM | POA: Diagnosis not present

## 2021-12-16 DIAGNOSIS — H409 Unspecified glaucoma: Secondary | ICD-10-CM | POA: Diagnosis not present

## 2021-12-16 DIAGNOSIS — Z87440 Personal history of urinary (tract) infections: Secondary | ICD-10-CM | POA: Diagnosis not present

## 2021-12-16 DIAGNOSIS — Z7982 Long term (current) use of aspirin: Secondary | ICD-10-CM | POA: Diagnosis not present

## 2021-12-16 DIAGNOSIS — N3941 Urge incontinence: Secondary | ICD-10-CM | POA: Diagnosis not present

## 2021-12-16 DIAGNOSIS — R351 Nocturia: Secondary | ICD-10-CM | POA: Diagnosis not present

## 2021-12-16 DIAGNOSIS — Z9181 History of falling: Secondary | ICD-10-CM | POA: Diagnosis not present

## 2021-12-16 DIAGNOSIS — M48061 Spinal stenosis, lumbar region without neurogenic claudication: Secondary | ICD-10-CM | POA: Diagnosis not present

## 2021-12-16 DIAGNOSIS — Z791 Long term (current) use of non-steroidal anti-inflammatories (NSAID): Secondary | ICD-10-CM | POA: Diagnosis not present

## 2022-01-01 DIAGNOSIS — E559 Vitamin D deficiency, unspecified: Secondary | ICD-10-CM | POA: Diagnosis not present

## 2022-01-01 DIAGNOSIS — Z791 Long term (current) use of non-steroidal anti-inflammatories (NSAID): Secondary | ICD-10-CM | POA: Diagnosis not present

## 2022-01-01 DIAGNOSIS — F329 Major depressive disorder, single episode, unspecified: Secondary | ICD-10-CM | POA: Diagnosis not present

## 2022-01-01 DIAGNOSIS — H409 Unspecified glaucoma: Secondary | ICD-10-CM | POA: Diagnosis not present

## 2022-01-01 DIAGNOSIS — Z79899 Other long term (current) drug therapy: Secondary | ICD-10-CM | POA: Diagnosis not present

## 2022-01-01 DIAGNOSIS — N401 Enlarged prostate with lower urinary tract symptoms: Secondary | ICD-10-CM | POA: Diagnosis not present

## 2022-01-01 DIAGNOSIS — R351 Nocturia: Secondary | ICD-10-CM | POA: Diagnosis not present

## 2022-01-01 DIAGNOSIS — N3941 Urge incontinence: Secondary | ICD-10-CM | POA: Diagnosis not present

## 2022-01-01 DIAGNOSIS — E7800 Pure hypercholesterolemia, unspecified: Secondary | ICD-10-CM | POA: Diagnosis not present

## 2022-01-01 DIAGNOSIS — Z87440 Personal history of urinary (tract) infections: Secondary | ICD-10-CM | POA: Diagnosis not present

## 2022-01-01 DIAGNOSIS — Z9181 History of falling: Secondary | ICD-10-CM | POA: Diagnosis not present

## 2022-01-01 DIAGNOSIS — R4189 Other symptoms and signs involving cognitive functions and awareness: Secondary | ICD-10-CM | POA: Diagnosis not present

## 2022-01-01 DIAGNOSIS — Z8673 Personal history of transient ischemic attack (TIA), and cerebral infarction without residual deficits: Secondary | ICD-10-CM | POA: Diagnosis not present

## 2022-01-01 DIAGNOSIS — M48061 Spinal stenosis, lumbar region without neurogenic claudication: Secondary | ICD-10-CM | POA: Diagnosis not present

## 2022-01-01 DIAGNOSIS — Z7982 Long term (current) use of aspirin: Secondary | ICD-10-CM | POA: Diagnosis not present

## 2022-01-05 DIAGNOSIS — E7800 Pure hypercholesterolemia, unspecified: Secondary | ICD-10-CM | POA: Diagnosis not present

## 2022-01-05 DIAGNOSIS — N400 Enlarged prostate without lower urinary tract symptoms: Secondary | ICD-10-CM | POA: Diagnosis not present

## 2022-01-05 DIAGNOSIS — F322 Major depressive disorder, single episode, severe without psychotic features: Secondary | ICD-10-CM | POA: Diagnosis not present

## 2022-01-08 DIAGNOSIS — N3941 Urge incontinence: Secondary | ICD-10-CM | POA: Diagnosis not present

## 2022-01-08 DIAGNOSIS — Z79899 Other long term (current) drug therapy: Secondary | ICD-10-CM | POA: Diagnosis not present

## 2022-01-08 DIAGNOSIS — H409 Unspecified glaucoma: Secondary | ICD-10-CM | POA: Diagnosis not present

## 2022-01-08 DIAGNOSIS — F329 Major depressive disorder, single episode, unspecified: Secondary | ICD-10-CM | POA: Diagnosis not present

## 2022-01-08 DIAGNOSIS — E559 Vitamin D deficiency, unspecified: Secondary | ICD-10-CM | POA: Diagnosis not present

## 2022-01-08 DIAGNOSIS — Z87440 Personal history of urinary (tract) infections: Secondary | ICD-10-CM | POA: Diagnosis not present

## 2022-01-08 DIAGNOSIS — Z791 Long term (current) use of non-steroidal anti-inflammatories (NSAID): Secondary | ICD-10-CM | POA: Diagnosis not present

## 2022-01-08 DIAGNOSIS — R4189 Other symptoms and signs involving cognitive functions and awareness: Secondary | ICD-10-CM | POA: Diagnosis not present

## 2022-01-08 DIAGNOSIS — N401 Enlarged prostate with lower urinary tract symptoms: Secondary | ICD-10-CM | POA: Diagnosis not present

## 2022-01-08 DIAGNOSIS — E7800 Pure hypercholesterolemia, unspecified: Secondary | ICD-10-CM | POA: Diagnosis not present

## 2022-01-08 DIAGNOSIS — Z7982 Long term (current) use of aspirin: Secondary | ICD-10-CM | POA: Diagnosis not present

## 2022-01-08 DIAGNOSIS — Z8673 Personal history of transient ischemic attack (TIA), and cerebral infarction without residual deficits: Secondary | ICD-10-CM | POA: Diagnosis not present

## 2022-01-08 DIAGNOSIS — R351 Nocturia: Secondary | ICD-10-CM | POA: Diagnosis not present

## 2022-01-08 DIAGNOSIS — M48061 Spinal stenosis, lumbar region without neurogenic claudication: Secondary | ICD-10-CM | POA: Diagnosis not present

## 2022-01-08 DIAGNOSIS — Z9181 History of falling: Secondary | ICD-10-CM | POA: Diagnosis not present

## 2022-01-19 DIAGNOSIS — H4010X2 Unspecified open-angle glaucoma, moderate stage: Secondary | ICD-10-CM | POA: Diagnosis not present

## 2022-05-18 DIAGNOSIS — R350 Frequency of micturition: Secondary | ICD-10-CM | POA: Diagnosis not present

## 2022-07-15 DIAGNOSIS — H401131 Primary open-angle glaucoma, bilateral, mild stage: Secondary | ICD-10-CM | POA: Diagnosis not present

## 2022-09-07 DIAGNOSIS — R4189 Other symptoms and signs involving cognitive functions and awareness: Secondary | ICD-10-CM | POA: Diagnosis not present

## 2022-11-15 DIAGNOSIS — Z1331 Encounter for screening for depression: Secondary | ICD-10-CM | POA: Diagnosis not present

## 2022-11-15 DIAGNOSIS — E559 Vitamin D deficiency, unspecified: Secondary | ICD-10-CM | POA: Diagnosis not present

## 2022-11-15 DIAGNOSIS — F330 Major depressive disorder, recurrent, mild: Secondary | ICD-10-CM | POA: Diagnosis not present

## 2022-11-15 DIAGNOSIS — Z79899 Other long term (current) drug therapy: Secondary | ICD-10-CM | POA: Diagnosis not present

## 2022-11-15 DIAGNOSIS — E7800 Pure hypercholesterolemia, unspecified: Secondary | ICD-10-CM | POA: Diagnosis not present

## 2022-11-15 DIAGNOSIS — H409 Unspecified glaucoma: Secondary | ICD-10-CM | POA: Diagnosis not present

## 2022-11-15 DIAGNOSIS — K5900 Constipation, unspecified: Secondary | ICD-10-CM | POA: Diagnosis not present

## 2022-11-15 DIAGNOSIS — F09 Unspecified mental disorder due to known physiological condition: Secondary | ICD-10-CM | POA: Diagnosis not present

## 2022-11-15 DIAGNOSIS — Z23 Encounter for immunization: Secondary | ICD-10-CM | POA: Diagnosis not present

## 2022-11-15 DIAGNOSIS — Z0000 Encounter for general adult medical examination without abnormal findings: Secondary | ICD-10-CM | POA: Diagnosis not present

## 2022-11-15 DIAGNOSIS — F0390 Unspecified dementia without behavioral disturbance: Secondary | ICD-10-CM | POA: Diagnosis not present

## 2022-11-15 DIAGNOSIS — G459 Transient cerebral ischemic attack, unspecified: Secondary | ICD-10-CM | POA: Diagnosis not present

## 2022-11-15 DIAGNOSIS — G319 Degenerative disease of nervous system, unspecified: Secondary | ICD-10-CM | POA: Diagnosis not present

## 2023-01-17 DIAGNOSIS — H401131 Primary open-angle glaucoma, bilateral, mild stage: Secondary | ICD-10-CM | POA: Diagnosis not present

## 2023-01-17 DIAGNOSIS — H35371 Puckering of macula, right eye: Secondary | ICD-10-CM | POA: Diagnosis not present

## 2023-02-14 DIAGNOSIS — Z23 Encounter for immunization: Secondary | ICD-10-CM | POA: Diagnosis not present

## 2023-03-29 DIAGNOSIS — E7800 Pure hypercholesterolemia, unspecified: Secondary | ICD-10-CM | POA: Diagnosis not present

## 2023-03-29 DIAGNOSIS — E663 Overweight: Secondary | ICD-10-CM | POA: Diagnosis not present

## 2023-03-29 DIAGNOSIS — G309 Alzheimer's disease, unspecified: Secondary | ICD-10-CM | POA: Diagnosis not present

## 2023-03-29 DIAGNOSIS — F0280 Dementia in other diseases classified elsewhere without behavioral disturbance: Secondary | ICD-10-CM | POA: Diagnosis not present

## 2023-03-29 DIAGNOSIS — H4010X2 Unspecified open-angle glaucoma, moderate stage: Secondary | ICD-10-CM | POA: Diagnosis not present

## 2023-03-29 DIAGNOSIS — N400 Enlarged prostate without lower urinary tract symptoms: Secondary | ICD-10-CM | POA: Diagnosis not present

## 2023-03-29 DIAGNOSIS — R32 Unspecified urinary incontinence: Secondary | ICD-10-CM | POA: Diagnosis not present

## 2023-03-29 DIAGNOSIS — H9193 Unspecified hearing loss, bilateral: Secondary | ICD-10-CM | POA: Diagnosis not present

## 2023-03-29 DIAGNOSIS — M1990 Unspecified osteoarthritis, unspecified site: Secondary | ICD-10-CM | POA: Diagnosis not present

## 2023-03-29 DIAGNOSIS — G319 Degenerative disease of nervous system, unspecified: Secondary | ICD-10-CM | POA: Diagnosis not present

## 2023-03-29 DIAGNOSIS — G8929 Other chronic pain: Secondary | ICD-10-CM | POA: Diagnosis not present

## 2023-03-29 DIAGNOSIS — F329 Major depressive disorder, single episode, unspecified: Secondary | ICD-10-CM | POA: Diagnosis not present

## 2023-07-26 DIAGNOSIS — Z961 Presence of intraocular lens: Secondary | ICD-10-CM | POA: Diagnosis not present

## 2023-07-26 DIAGNOSIS — H401131 Primary open-angle glaucoma, bilateral, mild stage: Secondary | ICD-10-CM | POA: Diagnosis not present

## 2023-11-02 ENCOUNTER — Other Ambulatory Visit (HOSPITAL_COMMUNITY): Payer: Self-pay

## 2023-11-03 ENCOUNTER — Other Ambulatory Visit: Payer: Self-pay

## 2023-11-04 ENCOUNTER — Other Ambulatory Visit: Payer: Self-pay

## 2023-11-07 ENCOUNTER — Other Ambulatory Visit (HOSPITAL_COMMUNITY): Payer: Self-pay

## 2023-11-08 ENCOUNTER — Other Ambulatory Visit: Payer: Self-pay

## 2023-11-09 ENCOUNTER — Other Ambulatory Visit: Payer: Self-pay

## 2023-11-10 ENCOUNTER — Other Ambulatory Visit (HOSPITAL_COMMUNITY): Payer: Self-pay

## 2023-11-10 MED ORDER — LATANOPROST 0.005 % OP SOLN: 1.0000 [drp] | Freq: Every day | OPHTHALMIC | 4 refills | Status: DC

## 2023-11-10 MED ORDER — TAMSULOSIN HCL 0.4 MG PO CAPS
0.4000 mg | ORAL_CAPSULE | Freq: Every day | ORAL | 0 refills | Status: DC
  Filled 2023-11-17 – 2023-11-21 (×2): qty 90, 90d supply, fill #0
  Filled 2023-12-21: qty 30, 30d supply, fill #0
  Filled 2024-01-18: qty 30, 30d supply, fill #1
  Filled 2024-01-30 – 2024-02-14 (×2): qty 30, 30d supply, fill #2

## 2023-11-10 MED ORDER — VENLAFAXINE HCL ER 150 MG PO CP24
150.0000 mg | ORAL_CAPSULE | Freq: Every day | ORAL | 2 refills | Status: DC
  Filled 2023-11-17 – 2023-11-21 (×2): qty 90, 90d supply, fill #0
  Filled 2023-11-21 – 2023-11-22 (×2): qty 30, 30d supply, fill #0
  Filled 2023-12-01 – 2023-12-20 (×4): qty 30, 30d supply, fill #1
  Filled 2024-01-18: qty 30, 30d supply, fill #2
  Filled 2024-01-30 – 2024-02-14 (×2): qty 30, 30d supply, fill #3
  Filled 2024-03-08 – 2024-03-13 (×2): qty 30, 30d supply, fill #4
  Filled 2024-04-12 – 2024-04-19 (×2): qty 30, 30d supply, fill #5
  Filled 2024-05-15 – 2024-05-21 (×2): qty 30, 30d supply, fill #6
  Filled 2024-06-13: qty 30, 30d supply, fill #7
  Filled 2024-07-10: qty 30, 30d supply, fill #8

## 2023-11-10 MED ORDER — IBUPROFEN 200 MG PO TABS
200.0000 mg | ORAL_TABLET | Freq: Every morning | ORAL | 11 refills | Status: DC
  Filled 2023-11-17 – 2023-11-22 (×4): qty 30, 30d supply, fill #0
  Filled 2023-12-01 – 2023-12-20 (×4): qty 30, 30d supply, fill #1
  Filled 2024-01-18: qty 30, 30d supply, fill #2
  Filled 2024-01-30 – 2024-02-14 (×2): qty 30, 30d supply, fill #3
  Filled 2024-03-08 – 2024-03-13 (×2): qty 30, 30d supply, fill #4
  Filled 2024-04-12 – 2024-04-19 (×2): qty 30, 30d supply, fill #5

## 2023-11-10 MED ORDER — ASPIRIN 81 MG PO TBEC
81.0000 mg | DELAYED_RELEASE_TABLET | Freq: Every evening | ORAL | 3 refills | Status: DC
  Filled 2023-11-17: qty 90, 90d supply, fill #0
  Filled 2023-11-21: qty 30, 30d supply, fill #0
  Filled 2023-11-21: qty 90, 90d supply, fill #0
  Filled 2023-11-22: qty 30, 30d supply, fill #0

## 2023-11-10 MED ORDER — MEMANTINE HCL 5 MG PO TABS
10.0000 mg | ORAL_TABLET | Freq: Two times a day (BID) | ORAL | 2 refills | Status: DC
  Filled 2023-11-17: qty 360, 90d supply, fill #0
  Filled 2023-11-21: qty 120, 30d supply, fill #0
  Filled 2023-11-21: qty 360, 90d supply, fill #0
  Filled 2023-11-22: qty 120, 30d supply, fill #0
  Filled 2023-12-01 – 2023-12-20 (×4): qty 120, 30d supply, fill #1
  Filled 2024-01-18: qty 120, 30d supply, fill #2
  Filled 2024-01-30 – 2024-02-14 (×2): qty 120, 30d supply, fill #3
  Filled 2024-03-08 – 2024-03-13 (×2): qty 120, 30d supply, fill #4
  Filled 2024-04-12 – 2024-04-19 (×2): qty 120, 30d supply, fill #5
  Filled 2024-05-15 – 2024-05-21 (×2): qty 120, 30d supply, fill #6
  Filled 2024-06-13: qty 120, 30d supply, fill #7
  Filled 2024-07-10: qty 120, 30d supply, fill #8

## 2023-11-10 MED ORDER — ACETAMINOPHEN 500 MG PO TABS
1000.0000 mg | ORAL_TABLET | Freq: Two times a day (BID) | ORAL | 3 refills | Status: DC
  Filled 2023-11-17: qty 360, 90d supply, fill #0
  Filled 2023-11-21: qty 120, 30d supply, fill #0
  Filled 2023-11-21: qty 360, 90d supply, fill #0
  Filled 2023-11-22: qty 120, 30d supply, fill #0

## 2023-11-10 MED ORDER — DORZOLAMIDE HCL-TIMOLOL MAL 2-0.5 % OP SOLN
1.0000 [drp] | Freq: Two times a day (BID) | OPHTHALMIC | 4 refills | Status: AC
  Filled 2023-11-21: qty 20, 100d supply, fill #0
  Filled 2023-11-21: qty 20, 200d supply, fill #0
  Filled 2023-11-22: qty 10, 100d supply, fill #0
  Filled 2024-02-20: qty 10, 50d supply, fill #1
  Filled 2024-02-20 (×2): qty 10, 100d supply, fill #1
  Filled 2024-02-20: qty 20, 100d supply, fill #1
  Filled 2024-04-12: qty 20, 100d supply, fill #2

## 2023-11-10 MED ORDER — TRIMETHOPRIM 100 MG PO TABS
100.0000 mg | ORAL_TABLET | Freq: Every evening | ORAL | 2 refills | Status: DC
  Filled 2023-11-17: qty 90, 90d supply, fill #0
  Filled 2023-11-21: qty 30, 30d supply, fill #0
  Filled 2023-11-21: qty 90, 90d supply, fill #0
  Filled 2023-11-22: qty 30, 30d supply, fill #0
  Filled 2023-12-01 – 2023-12-20 (×4): qty 30, 30d supply, fill #1
  Filled 2024-01-18: qty 30, 30d supply, fill #2
  Filled 2024-01-30 – 2024-02-14 (×2): qty 30, 30d supply, fill #3
  Filled 2024-03-08 – 2024-03-13 (×2): qty 30, 30d supply, fill #4
  Filled 2024-04-12 – 2024-04-19 (×2): qty 30, 30d supply, fill #5
  Filled 2024-05-15 – 2024-05-21 (×2): qty 30, 30d supply, fill #6
  Filled 2024-06-13: qty 30, 30d supply, fill #7
  Filled 2024-07-10: qty 30, 30d supply, fill #8

## 2023-11-10 MED ORDER — VITAMIN D3 50 MCG (2000 UT) PO TABS
2000.0000 [IU] | ORAL_TABLET | Freq: Every morning | ORAL | 11 refills | Status: DC
  Filled 2023-11-17: qty 100, 100d supply, fill #0
  Filled 2023-11-21 – 2023-11-22 (×3): qty 30, 30d supply, fill #0
  Filled 2023-12-01 – 2023-12-20 (×4): qty 30, 30d supply, fill #1
  Filled 2024-01-18: qty 30, 30d supply, fill #2
  Filled 2024-01-30 – 2024-02-14 (×2): qty 30, 30d supply, fill #3
  Filled 2024-03-08 – 2024-03-13 (×2): qty 30, 30d supply, fill #4
  Filled 2024-04-12 – 2024-04-19 (×2): qty 30, 30d supply, fill #5

## 2023-11-10 MED ORDER — DONEPEZIL HCL 5 MG PO TABS
5.0000 mg | ORAL_TABLET | Freq: Every day | ORAL | 2 refills | Status: DC
  Filled 2023-11-17 – 2023-12-01 (×3): qty 90, 90d supply, fill #0
  Filled 2023-12-15: qty 30, 30d supply, fill #0
  Filled 2023-12-16: qty 90, 90d supply, fill #0
  Filled 2023-12-16 – 2023-12-20 (×2): qty 30, 30d supply, fill #0
  Filled 2024-01-18: qty 30, 30d supply, fill #1
  Filled 2024-01-30 – 2024-02-14 (×2): qty 30, 30d supply, fill #2
  Filled 2024-03-08 – 2024-03-13 (×2): qty 30, 30d supply, fill #3
  Filled 2024-04-12 – 2024-04-19 (×2): qty 30, 30d supply, fill #4
  Filled 2024-05-15 – 2024-05-21 (×2): qty 30, 30d supply, fill #5

## 2023-11-11 ENCOUNTER — Other Ambulatory Visit (HOSPITAL_COMMUNITY): Payer: Self-pay

## 2023-11-17 ENCOUNTER — Other Ambulatory Visit: Payer: Self-pay

## 2023-11-17 DIAGNOSIS — M4800 Spinal stenosis, site unspecified: Secondary | ICD-10-CM | POA: Diagnosis not present

## 2023-11-17 DIAGNOSIS — Z79899 Other long term (current) drug therapy: Secondary | ICD-10-CM | POA: Diagnosis not present

## 2023-11-17 DIAGNOSIS — Z0000 Encounter for general adult medical examination without abnormal findings: Secondary | ICD-10-CM | POA: Diagnosis not present

## 2023-11-17 DIAGNOSIS — Z7189 Other specified counseling: Secondary | ICD-10-CM | POA: Diagnosis not present

## 2023-11-17 DIAGNOSIS — F03B18 Unspecified dementia, moderate, with other behavioral disturbance: Secondary | ICD-10-CM | POA: Diagnosis not present

## 2023-11-17 DIAGNOSIS — E559 Vitamin D deficiency, unspecified: Secondary | ICD-10-CM | POA: Diagnosis not present

## 2023-11-17 DIAGNOSIS — Z23 Encounter for immunization: Secondary | ICD-10-CM | POA: Diagnosis not present

## 2023-11-17 DIAGNOSIS — G459 Transient cerebral ischemic attack, unspecified: Secondary | ICD-10-CM | POA: Diagnosis not present

## 2023-11-17 DIAGNOSIS — H409 Unspecified glaucoma: Secondary | ICD-10-CM | POA: Diagnosis not present

## 2023-11-17 DIAGNOSIS — F09 Unspecified mental disorder due to known physiological condition: Secondary | ICD-10-CM | POA: Diagnosis not present

## 2023-11-17 DIAGNOSIS — Z1331 Encounter for screening for depression: Secondary | ICD-10-CM | POA: Diagnosis not present

## 2023-11-17 DIAGNOSIS — B351 Tinea unguium: Secondary | ICD-10-CM | POA: Diagnosis not present

## 2023-11-17 DIAGNOSIS — G319 Degenerative disease of nervous system, unspecified: Secondary | ICD-10-CM | POA: Diagnosis not present

## 2023-11-17 DIAGNOSIS — F330 Major depressive disorder, recurrent, mild: Secondary | ICD-10-CM | POA: Diagnosis not present

## 2023-11-17 DIAGNOSIS — E7800 Pure hypercholesterolemia, unspecified: Secondary | ICD-10-CM | POA: Diagnosis not present

## 2023-11-21 ENCOUNTER — Other Ambulatory Visit: Payer: Self-pay

## 2023-11-21 ENCOUNTER — Other Ambulatory Visit (HOSPITAL_COMMUNITY): Payer: Self-pay

## 2023-11-22 ENCOUNTER — Other Ambulatory Visit: Payer: Self-pay

## 2023-11-22 ENCOUNTER — Other Ambulatory Visit (HOSPITAL_COMMUNITY): Payer: Self-pay

## 2023-11-22 ENCOUNTER — Other Ambulatory Visit (HOSPITAL_BASED_OUTPATIENT_CLINIC_OR_DEPARTMENT_OTHER): Payer: Self-pay

## 2023-11-23 ENCOUNTER — Other Ambulatory Visit: Payer: Self-pay

## 2023-11-23 ENCOUNTER — Other Ambulatory Visit (HOSPITAL_COMMUNITY): Payer: Self-pay

## 2023-11-24 ENCOUNTER — Other Ambulatory Visit: Payer: Self-pay

## 2023-11-25 ENCOUNTER — Other Ambulatory Visit: Payer: Self-pay

## 2023-11-28 ENCOUNTER — Other Ambulatory Visit: Payer: Self-pay

## 2023-11-28 ENCOUNTER — Other Ambulatory Visit (HOSPITAL_COMMUNITY): Payer: Self-pay

## 2023-12-01 ENCOUNTER — Other Ambulatory Visit (HOSPITAL_COMMUNITY): Payer: Self-pay

## 2023-12-01 ENCOUNTER — Other Ambulatory Visit: Payer: Self-pay

## 2023-12-15 ENCOUNTER — Other Ambulatory Visit: Payer: Self-pay

## 2023-12-15 ENCOUNTER — Other Ambulatory Visit (HOSPITAL_COMMUNITY): Payer: Self-pay

## 2023-12-16 ENCOUNTER — Other Ambulatory Visit: Payer: Self-pay

## 2023-12-20 ENCOUNTER — Other Ambulatory Visit: Payer: Self-pay

## 2023-12-20 ENCOUNTER — Other Ambulatory Visit (HOSPITAL_COMMUNITY): Payer: Self-pay

## 2023-12-21 ENCOUNTER — Other Ambulatory Visit: Payer: Self-pay

## 2023-12-22 ENCOUNTER — Other Ambulatory Visit: Payer: Self-pay

## 2023-12-22 MED ORDER — ACETAMINOPHEN 500 MG PO TABS
1000.0000 mg | ORAL_TABLET | Freq: Two times a day (BID) | ORAL | 3 refills | Status: AC
  Filled 2023-12-22: qty 120, 30d supply, fill #0
  Filled 2024-01-18: qty 120, 30d supply, fill #1
  Filled 2024-01-30 – 2024-02-14 (×2): qty 120, 30d supply, fill #2
  Filled 2024-03-08 – 2024-03-13 (×2): qty 120, 30d supply, fill #3
  Filled 2024-04-12 – 2024-04-19 (×2): qty 120, 30d supply, fill #4
  Filled 2024-05-15 – 2024-05-21 (×2): qty 120, 30d supply, fill #5
  Filled 2024-06-13: qty 120, 30d supply, fill #6
  Filled 2024-07-10: qty 120, 30d supply, fill #7
  Filled 2024-08-06 – 2024-08-23 (×3): qty 120, 30d supply, fill #8
  Filled 2024-09-17: qty 120, 30d supply, fill #9
  Filled 2024-10-15 – 2024-10-19 (×2): qty 120, 30d supply, fill #10

## 2023-12-22 MED ORDER — ASPIRIN 81 MG PO TBEC
81.0000 mg | DELAYED_RELEASE_TABLET | Freq: Every evening | ORAL | 3 refills | Status: AC
  Filled 2023-12-22: qty 30, 30d supply, fill #0
  Filled 2024-01-18: qty 30, 30d supply, fill #1
  Filled 2024-01-30 – 2024-02-14 (×2): qty 30, 30d supply, fill #2
  Filled 2024-03-08 – 2024-03-13 (×2): qty 30, 30d supply, fill #3
  Filled 2024-04-12 – 2024-04-19 (×2): qty 30, 30d supply, fill #4
  Filled 2024-05-15 – 2024-05-21 (×2): qty 30, 30d supply, fill #5
  Filled 2024-06-13: qty 30, 30d supply, fill #6
  Filled 2024-07-10: qty 30, 30d supply, fill #7
  Filled 2024-08-06 – 2024-08-23 (×3): qty 30, 30d supply, fill #8
  Filled 2024-09-17: qty 30, 30d supply, fill #9
  Filled 2024-10-15: qty 30, 30d supply, fill #10

## 2023-12-23 ENCOUNTER — Other Ambulatory Visit: Payer: Self-pay

## 2023-12-24 ENCOUNTER — Other Ambulatory Visit (HOSPITAL_COMMUNITY): Payer: Self-pay

## 2023-12-26 ENCOUNTER — Other Ambulatory Visit: Payer: Self-pay

## 2023-12-26 ENCOUNTER — Other Ambulatory Visit (HOSPITAL_COMMUNITY): Payer: Self-pay

## 2023-12-27 ENCOUNTER — Other Ambulatory Visit: Payer: Self-pay

## 2024-01-02 ENCOUNTER — Ambulatory Visit: Admitting: Podiatry

## 2024-01-02 ENCOUNTER — Encounter: Payer: Self-pay | Admitting: Podiatry

## 2024-01-02 DIAGNOSIS — B351 Tinea unguium: Secondary | ICD-10-CM

## 2024-01-02 DIAGNOSIS — M79674 Pain in right toe(s): Secondary | ICD-10-CM | POA: Diagnosis not present

## 2024-01-02 DIAGNOSIS — M79675 Pain in left toe(s): Secondary | ICD-10-CM

## 2024-01-02 NOTE — Progress Notes (Signed)
  Subjective:  Patient ID: Chad Holden, male    DOB: 04/21/1934,   MRN: [id number]  Chief Complaint  Patient presents with   Nail Problem    Nail trim     89 y.o. male presents for concern of thickened elongated and painful nails that are difficult to trim. Requesting to have them trimmed today. He is not diabetic PCP:  Benedetta Bradley, MD    . Denies any other pedal complaints. Denies n/v/f/c.   Past Medical History:  Diagnosis Date   Dementia (HCC)    Depression    Glaucoma    Renal disorder     Objective:  Physical Exam: Vascular: DP/PT pulses 2/4 bilateral. CFT <3 seconds. Absent hair growth on digits. Edema noted to bilateral lower extremities. Xerosis noted bilaterally.  Skin. No lacerations or abrasions bilateral feet. Nails 1-5 bilateral  are thickened discolored and elongated with subungual debris.  Musculoskeletal: MMT 5/5 bilateral lower extremities in DF, PF, Inversion and Eversion. Deceased ROM in DF of ankle joint.  Neurological: Sensation intact to light touch. Protective sensation intactbilateral.    Assessment:   1. Pain due to onychomycosis of toenails of both feet   2. Type 2 diabetes mellitus with peripheral neuropathy (HCC)      Plan:  Patient was evaluated and treated and all questions answered. -Discussed and educated patient on foot care, especially with  regards to the vascular, neurological and musculoskeletal systems.  -Discussed supportive shoes at all times and checking feet regularly.  -Mechanically debrided all nails 1-5 bilateral using sterile nail nipper and filed with dremel without incident as courtesy -Answered all patient questions -Patient to return  in 3 months for at risk foot care -Patient advised to call the office if any problems or questions arise in the meantime.   Jennefer Moats, DPM

## 2024-01-18 ENCOUNTER — Other Ambulatory Visit (HOSPITAL_COMMUNITY): Payer: Self-pay

## 2024-01-18 ENCOUNTER — Other Ambulatory Visit: Payer: Self-pay

## 2024-01-19 ENCOUNTER — Other Ambulatory Visit: Payer: Self-pay

## 2024-01-19 ENCOUNTER — Other Ambulatory Visit (HOSPITAL_COMMUNITY): Payer: Self-pay

## 2024-01-24 ENCOUNTER — Other Ambulatory Visit: Payer: Self-pay

## 2024-01-30 ENCOUNTER — Other Ambulatory Visit: Payer: Self-pay

## 2024-01-30 DIAGNOSIS — H26491 Other secondary cataract, right eye: Secondary | ICD-10-CM | POA: Diagnosis not present

## 2024-01-30 DIAGNOSIS — H401131 Primary open-angle glaucoma, bilateral, mild stage: Secondary | ICD-10-CM | POA: Diagnosis not present

## 2024-02-03 ENCOUNTER — Other Ambulatory Visit (HOSPITAL_COMMUNITY): Payer: Self-pay

## 2024-02-13 ENCOUNTER — Other Ambulatory Visit: Payer: Self-pay

## 2024-02-14 ENCOUNTER — Other Ambulatory Visit: Payer: Self-pay

## 2024-02-14 ENCOUNTER — Other Ambulatory Visit (HOSPITAL_COMMUNITY): Payer: Self-pay

## 2024-02-15 ENCOUNTER — Other Ambulatory Visit: Payer: Self-pay

## 2024-02-16 ENCOUNTER — Other Ambulatory Visit: Payer: Self-pay

## 2024-02-16 ENCOUNTER — Other Ambulatory Visit (HOSPITAL_COMMUNITY): Payer: Self-pay

## 2024-02-17 ENCOUNTER — Other Ambulatory Visit: Payer: Self-pay

## 2024-02-20 ENCOUNTER — Other Ambulatory Visit: Payer: Self-pay

## 2024-02-20 ENCOUNTER — Other Ambulatory Visit (HOSPITAL_COMMUNITY): Payer: Self-pay

## 2024-02-21 ENCOUNTER — Other Ambulatory Visit: Payer: Self-pay

## 2024-02-23 ENCOUNTER — Other Ambulatory Visit: Payer: Self-pay

## 2024-03-08 ENCOUNTER — Other Ambulatory Visit (HOSPITAL_COMMUNITY): Payer: Self-pay

## 2024-03-08 ENCOUNTER — Other Ambulatory Visit: Payer: Self-pay

## 2024-03-08 MED ORDER — TAMSULOSIN HCL 0.4 MG PO CAPS
0.4000 mg | ORAL_CAPSULE | Freq: Every day | ORAL | 1 refills | Status: DC
  Filled 2024-03-08: qty 90, 90d supply, fill #0
  Filled 2024-03-13: qty 30, 30d supply, fill #0
  Filled 2024-04-12 – 2024-04-19 (×2): qty 30, 30d supply, fill #1
  Filled 2024-05-15 – 2024-05-21 (×3): qty 30, 30d supply, fill #2
  Filled 2024-06-13: qty 30, 30d supply, fill #3
  Filled 2024-07-10: qty 30, 30d supply, fill #4

## 2024-03-09 ENCOUNTER — Other Ambulatory Visit (HOSPITAL_COMMUNITY): Payer: Self-pay

## 2024-03-09 ENCOUNTER — Other Ambulatory Visit: Payer: Self-pay

## 2024-03-13 ENCOUNTER — Other Ambulatory Visit: Payer: Self-pay

## 2024-03-14 ENCOUNTER — Other Ambulatory Visit: Payer: Self-pay

## 2024-03-20 ENCOUNTER — Other Ambulatory Visit: Payer: Self-pay

## 2024-03-30 ENCOUNTER — Other Ambulatory Visit: Payer: Self-pay

## 2024-04-05 ENCOUNTER — Other Ambulatory Visit: Payer: Self-pay

## 2024-04-05 DIAGNOSIS — E663 Overweight: Secondary | ICD-10-CM | POA: Diagnosis not present

## 2024-04-05 DIAGNOSIS — F03B3 Unspecified dementia, moderate, with mood disturbance: Secondary | ICD-10-CM | POA: Diagnosis not present

## 2024-04-05 DIAGNOSIS — Z792 Long term (current) use of antibiotics: Secondary | ICD-10-CM | POA: Diagnosis not present

## 2024-04-05 DIAGNOSIS — I6782 Cerebral ischemia: Secondary | ICD-10-CM | POA: Diagnosis not present

## 2024-04-05 DIAGNOSIS — F3342 Major depressive disorder, recurrent, in full remission: Secondary | ICD-10-CM | POA: Diagnosis not present

## 2024-04-05 DIAGNOSIS — G8929 Other chronic pain: Secondary | ICD-10-CM | POA: Diagnosis not present

## 2024-04-05 DIAGNOSIS — N400 Enlarged prostate without lower urinary tract symptoms: Secondary | ICD-10-CM | POA: Diagnosis not present

## 2024-04-05 DIAGNOSIS — E7800 Pure hypercholesterolemia, unspecified: Secondary | ICD-10-CM | POA: Diagnosis not present

## 2024-04-05 DIAGNOSIS — H4010X2 Unspecified open-angle glaucoma, moderate stage: Secondary | ICD-10-CM | POA: Diagnosis not present

## 2024-04-05 DIAGNOSIS — M1990 Unspecified osteoarthritis, unspecified site: Secondary | ICD-10-CM | POA: Diagnosis not present

## 2024-04-05 DIAGNOSIS — H9193 Unspecified hearing loss, bilateral: Secondary | ICD-10-CM | POA: Diagnosis not present

## 2024-04-05 DIAGNOSIS — G319 Degenerative disease of nervous system, unspecified: Secondary | ICD-10-CM | POA: Diagnosis not present

## 2024-04-12 ENCOUNTER — Other Ambulatory Visit: Payer: Self-pay

## 2024-04-13 ENCOUNTER — Other Ambulatory Visit: Payer: Self-pay

## 2024-04-17 ENCOUNTER — Other Ambulatory Visit: Payer: Self-pay

## 2024-04-19 ENCOUNTER — Other Ambulatory Visit: Payer: Self-pay

## 2024-04-20 ENCOUNTER — Other Ambulatory Visit: Payer: Self-pay

## 2024-05-07 ENCOUNTER — Other Ambulatory Visit: Payer: Self-pay

## 2024-05-15 ENCOUNTER — Other Ambulatory Visit: Payer: Self-pay

## 2024-05-15 ENCOUNTER — Other Ambulatory Visit (HOSPITAL_COMMUNITY): Payer: Self-pay

## 2024-05-16 ENCOUNTER — Other Ambulatory Visit: Payer: Self-pay

## 2024-05-16 ENCOUNTER — Other Ambulatory Visit (HOSPITAL_COMMUNITY): Payer: Self-pay

## 2024-05-16 MED ORDER — IBUPROFEN 200 MG PO TABS
200.0000 mg | ORAL_TABLET | ORAL | 11 refills | Status: AC
  Filled 2024-05-16 – 2024-05-21 (×4): qty 30, 30d supply, fill #0
  Filled 2024-06-13: qty 30, 30d supply, fill #1
  Filled 2024-07-10: qty 30, 30d supply, fill #2
  Filled 2024-08-06 – 2024-08-23 (×3): qty 30, 30d supply, fill #3
  Filled 2024-09-17: qty 30, 30d supply, fill #4
  Filled 2024-10-15: qty 30, 30d supply, fill #5

## 2024-05-16 MED ORDER — VITAMIN D3 50 MCG (2000 UT) PO TABS
50.0000 ug | ORAL_TABLET | ORAL | 11 refills | Status: AC
  Filled 2024-05-16 – 2024-05-21 (×4): qty 30, 30d supply, fill #0
  Filled 2024-06-13: qty 30, 30d supply, fill #1
  Filled 2024-07-10: qty 30, 30d supply, fill #2
  Filled 2024-08-06 – 2024-08-23 (×3): qty 30, 30d supply, fill #3
  Filled 2024-09-17: qty 30, 30d supply, fill #4
  Filled 2024-10-15: qty 30, 30d supply, fill #5

## 2024-05-17 ENCOUNTER — Other Ambulatory Visit: Payer: Self-pay

## 2024-05-18 ENCOUNTER — Other Ambulatory Visit: Payer: Self-pay

## 2024-05-21 ENCOUNTER — Other Ambulatory Visit: Payer: Self-pay

## 2024-05-21 ENCOUNTER — Other Ambulatory Visit (HOSPITAL_COMMUNITY): Payer: Self-pay

## 2024-05-22 ENCOUNTER — Other Ambulatory Visit: Payer: Self-pay

## 2024-05-23 ENCOUNTER — Other Ambulatory Visit: Payer: Self-pay

## 2024-06-13 ENCOUNTER — Other Ambulatory Visit (HOSPITAL_COMMUNITY): Payer: Self-pay

## 2024-06-13 ENCOUNTER — Other Ambulatory Visit: Payer: Self-pay

## 2024-06-13 MED ORDER — DONEPEZIL HCL 5 MG PO TABS
5.0000 mg | ORAL_TABLET | Freq: Every day | ORAL | 2 refills | Status: AC
  Filled 2024-06-13 (×2): qty 30, 30d supply, fill #0
  Filled 2024-07-10: qty 30, 30d supply, fill #1
  Filled 2024-08-06 – 2024-08-23 (×3): qty 30, 30d supply, fill #2
  Filled 2024-09-17: qty 30, 30d supply, fill #3
  Filled 2024-10-15: qty 30, 30d supply, fill #4

## 2024-06-14 ENCOUNTER — Other Ambulatory Visit (HOSPITAL_COMMUNITY): Payer: Self-pay

## 2024-06-14 ENCOUNTER — Other Ambulatory Visit: Payer: Self-pay

## 2024-06-15 ENCOUNTER — Other Ambulatory Visit: Payer: Self-pay

## 2024-06-30 ENCOUNTER — Emergency Department (HOSPITAL_COMMUNITY)

## 2024-06-30 ENCOUNTER — Other Ambulatory Visit: Payer: Self-pay

## 2024-06-30 ENCOUNTER — Inpatient Hospital Stay (HOSPITAL_COMMUNITY)
Admission: EM | Admit: 2024-06-30 | Discharge: 2024-07-06 | DRG: 312 | Disposition: A | Discharge status: Skilled Nursing Facility | Attending: Internal Medicine | Admitting: Internal Medicine

## 2024-06-30 ENCOUNTER — Encounter (HOSPITAL_COMMUNITY): Payer: Self-pay | Admitting: Emergency Medicine

## 2024-06-30 DIAGNOSIS — W19XXXA Unspecified fall, initial encounter: Secondary | ICD-10-CM | POA: Diagnosis not present

## 2024-06-30 DIAGNOSIS — N390 Urinary tract infection, site not specified: Secondary | ICD-10-CM | POA: Diagnosis not present

## 2024-06-30 DIAGNOSIS — Z043 Encounter for examination and observation following other accident: Secondary | ICD-10-CM | POA: Diagnosis not present

## 2024-06-30 DIAGNOSIS — Z66 Do not resuscitate: Secondary | ICD-10-CM | POA: Diagnosis present

## 2024-06-30 DIAGNOSIS — Z79899 Other long term (current) drug therapy: Secondary | ICD-10-CM

## 2024-06-30 DIAGNOSIS — W19XXXD Unspecified fall, subsequent encounter: Secondary | ICD-10-CM | POA: Diagnosis not present

## 2024-06-30 DIAGNOSIS — D539 Nutritional anemia, unspecified: Secondary | ICD-10-CM | POA: Diagnosis present

## 2024-06-30 DIAGNOSIS — F0390 Unspecified dementia without behavioral disturbance: Secondary | ICD-10-CM

## 2024-06-30 DIAGNOSIS — G9341 Metabolic encephalopathy: Secondary | ICD-10-CM | POA: Diagnosis present

## 2024-06-30 DIAGNOSIS — N3289 Other specified disorders of bladder: Secondary | ICD-10-CM | POA: Diagnosis present

## 2024-06-30 DIAGNOSIS — G9340 Encephalopathy, unspecified: Secondary | ICD-10-CM | POA: Diagnosis not present

## 2024-06-30 DIAGNOSIS — M47812 Spondylosis without myelopathy or radiculopathy, cervical region: Secondary | ICD-10-CM | POA: Diagnosis not present

## 2024-06-30 DIAGNOSIS — R310 Gross hematuria: Secondary | ICD-10-CM | POA: Diagnosis present

## 2024-06-30 DIAGNOSIS — Z87440 Personal history of urinary (tract) infections: Secondary | ICD-10-CM

## 2024-06-30 DIAGNOSIS — I951 Orthostatic hypotension: Secondary | ICD-10-CM | POA: Diagnosis not present

## 2024-06-30 DIAGNOSIS — F0393 Unspecified dementia, unspecified severity, with mood disturbance: Secondary | ICD-10-CM | POA: Diagnosis present

## 2024-06-30 DIAGNOSIS — R339 Retention of urine, unspecified: Secondary | ICD-10-CM | POA: Diagnosis not present

## 2024-06-30 DIAGNOSIS — G9389 Other specified disorders of brain: Secondary | ICD-10-CM | POA: Diagnosis not present

## 2024-06-30 DIAGNOSIS — R531 Weakness: Secondary | ICD-10-CM | POA: Diagnosis not present

## 2024-06-30 DIAGNOSIS — I959 Hypotension, unspecified: Secondary | ICD-10-CM | POA: Diagnosis not present

## 2024-06-30 DIAGNOSIS — Z885 Allergy status to narcotic agent status: Secondary | ICD-10-CM

## 2024-06-30 DIAGNOSIS — N329 Bladder disorder, unspecified: Secondary | ICD-10-CM | POA: Diagnosis present

## 2024-06-30 DIAGNOSIS — Y92009 Unspecified place in unspecified non-institutional (private) residence as the place of occurrence of the external cause: Secondary | ICD-10-CM | POA: Diagnosis not present

## 2024-06-30 DIAGNOSIS — D696 Thrombocytopenia, unspecified: Secondary | ICD-10-CM | POA: Diagnosis present

## 2024-06-30 DIAGNOSIS — Z9181 History of falling: Secondary | ICD-10-CM

## 2024-06-30 DIAGNOSIS — I5A Non-ischemic myocardial injury (non-traumatic): Secondary | ICD-10-CM | POA: Diagnosis present

## 2024-06-30 DIAGNOSIS — H409 Unspecified glaucoma: Secondary | ICD-10-CM | POA: Diagnosis present

## 2024-06-30 DIAGNOSIS — R55 Syncope and collapse: Secondary | ICD-10-CM | POA: Diagnosis not present

## 2024-06-30 DIAGNOSIS — N3941 Urge incontinence: Secondary | ICD-10-CM | POA: Diagnosis present

## 2024-06-30 DIAGNOSIS — Z818 Family history of other mental and behavioral disorders: Secondary | ICD-10-CM

## 2024-06-30 DIAGNOSIS — N189 Chronic kidney disease, unspecified: Secondary | ICD-10-CM | POA: Diagnosis present

## 2024-06-30 DIAGNOSIS — R296 Repeated falls: Secondary | ICD-10-CM | POA: Diagnosis present

## 2024-06-30 DIAGNOSIS — E7800 Pure hypercholesterolemia, unspecified: Secondary | ICD-10-CM | POA: Diagnosis not present

## 2024-06-30 DIAGNOSIS — F329 Major depressive disorder, single episode, unspecified: Secondary | ICD-10-CM | POA: Diagnosis present

## 2024-06-30 DIAGNOSIS — Z7982 Long term (current) use of aspirin: Secondary | ICD-10-CM

## 2024-06-30 LAB — URINALYSIS, W/ REFLEX TO CULTURE (INFECTION SUSPECTED)
Bilirubin Urine: NEGATIVE
Glucose, UA: NEGATIVE mg/dL
Ketones, ur: 20 mg/dL — AB
Leukocytes,Ua: NEGATIVE
Nitrite: NEGATIVE
Protein, ur: NEGATIVE mg/dL
Specific Gravity, Urine: 1.014 (ref 1.005–1.030)
pH: 6 (ref 5.0–8.0)

## 2024-06-30 LAB — CBC WITH DIFFERENTIAL/PLATELET
Abs Immature Granulocytes: 0.04 K/uL (ref 0.00–0.07)
Basophils Absolute: 0 K/uL (ref 0.0–0.1)
Basophils Relative: 0 %
Eosinophils Absolute: 0 K/uL (ref 0.0–0.5)
Eosinophils Relative: 0 %
HCT: 46.7 % (ref 39.0–52.0)
Hemoglobin: 15.4 g/dL (ref 13.0–17.0)
Immature Granulocytes: 1 %
Lymphocytes Relative: 12 %
Lymphs Abs: 0.8 K/uL (ref 0.7–4.0)
MCH: 33 pg (ref 26.0–34.0)
MCHC: 33 g/dL (ref 30.0–36.0)
MCV: 100.2 fL — ABNORMAL HIGH (ref 80.0–100.0)
Monocytes Absolute: 0.6 K/uL (ref 0.1–1.0)
Monocytes Relative: 9 %
Neutro Abs: 5.3 K/uL (ref 1.7–7.7)
Neutrophils Relative %: 78 %
Platelets: 155 K/uL (ref 150–400)
RBC: 4.66 MIL/uL (ref 4.22–5.81)
RDW: 12.9 % (ref 11.5–15.5)
WBC: 6.8 K/uL (ref 4.0–10.5)
nRBC: 0 % (ref 0.0–0.2)

## 2024-06-30 LAB — COMPREHENSIVE METABOLIC PANEL WITH GFR
ALT: 13 U/L (ref 0–44)
AST: 18 U/L (ref 15–41)
Albumin: 4.6 g/dL (ref 3.5–5.0)
Alkaline Phosphatase: 103 U/L (ref 38–126)
Anion gap: 11 (ref 5–15)
BUN: 21 mg/dL (ref 8–23)
CO2: 28 mmol/L (ref 22–32)
Calcium: 10.3 mg/dL (ref 8.9–10.3)
Chloride: 97 mmol/L — ABNORMAL LOW (ref 98–111)
Creatinine, Ser: 0.93 mg/dL (ref 0.61–1.24)
GFR, Estimated: >60 mL/min (ref >60)
Glucose, Bld: 109 mg/dL — ABNORMAL HIGH (ref 70–99)
Potassium: 4.5 mmol/L (ref 3.5–5.1)
Sodium: 137 mmol/L (ref 135–145)
Total Bilirubin: 0.6 mg/dL (ref 0.0–1.2)
Total Protein: 7.9 g/dL (ref 6.5–8.1)

## 2024-06-30 MED ORDER — SODIUM CHLORIDE 0.9 % IV SOLN
1.0000 g | Freq: Once | INTRAVENOUS | Status: AC
  Administered 2024-06-30: 1 g via INTRAVENOUS
  Filled 2024-06-30: qty 10

## 2024-06-30 MED ORDER — SODIUM CHLORIDE 0.9 % IV BOLUS
1000.0000 mL | Freq: Once | INTRAVENOUS | Status: AC
  Administered 2024-06-30: 1000 mL via INTRAVENOUS

## 2024-06-30 MED ORDER — CEFDINIR 300 MG PO CAPS: 300.0000 mg | ORAL_CAPSULE | Freq: Two times a day (BID) | ORAL | 0 refills | Status: DC

## 2024-06-30 NOTE — Discharge Instructions (Addendum)
 We evaluated Chad Holden for his fall and weakness.  His testing in the emergency department is reassuring.  It is possible that he has a urine infection, treated him with antibiotics and will send urine culture.  Please have him follow-up closely with his primary doctor.  If he has any new or worsening symptoms such as trouble walking, fevers or chills, increasing weakness, confusion, or any other new symptoms,, please bring him back to the emergency department

## 2024-06-30 NOTE — ED Notes (Signed)
 Attempted to ambulated pt with walker per provider order. Pt had difficulty sitting up on his own and unable to remain standing even with walker assistance. Pt sat back down in bed. Provider made aware.

## 2024-06-30 NOTE — ED Provider Notes (Signed)
 [facility] Provider Note  CSN: [id number] Arrival date & time: 06/30/24 1505  Chief Complaint(s) Fall  HPI Chad Holden is a 89 y.o. male history of dementia, hyperlipidemia presenting to the emergency department with weakness.  Patient's wife reports that they were getting ready to leave to go to do some errands, she told the patient, patient tried to go outside and get to the car which was locked and wife went outside and found him on the ground.  Patient does not know why he is in the hospital and cannot really provide any history other than current symptoms.  Wife notes that his shoes were half off when he fell so possible he might have tripped.  Patient denies any pain currently.  Patient's wife reports that he does have some increased weakness over the past few days, maybe not eating and drinking quite as much.  have not noticed any fevers or chills, cough, diarrhea, has not complained of chest pain, no trouble breathing, no sick contacts, no abdominal pain.  History is limited due to dementia.   Past Medical History Past Medical History:  Diagnosis Date   Dementia (HCC)    Depression    Glaucoma    Renal disorder    Patient Active Problem List   Diagnosis Date Noted   Major depression 06/30/2024   Pure hypercholesterolemia 06/30/2024   Dementia without behavioral disturbance (HCC) 06/30/2024   Fall 06/30/2024   ARF (acute renal failure) 01/31/2018   TIA (transient ischemic attack) 01/30/2018   Lower urinary tract infectious disease 12/24/2015   Acute encephalopathy 12/24/2015   Leukocytosis 12/24/2015   Urine retention 12/24/2015   Confusion 12/24/2015   Home Medication(s) Prior to Admission medications   Medication Sig Start Date End Date Taking? Authorizing Provider  cefdinir (OMNICEF) 300 MG capsule Take 1 capsule (300 mg total) by mouth 2 (two) times daily for 7 days. 06/30/24 07/07/24 Yes Francesca Elsie CROME, MD  acetaminophen  (TYLENOL ) 500 MG  tablet Take 2 tablets (1,000 mg total) by mouth 2 (two) times daily for joint/back pain 12/22/23     aspirin  EC (ASPIRIN  ADULT LOW DOSE) 81 MG tablet Take 1 tablet (81 mg total) by mouth every evening. 12/22/23     atorvastatin  (LIPITOR ) 40 MG tablet Take 1 tablet (40 mg total) by mouth daily. 02/01/18 02/01/19  Shona Terry SAILOR, DO  Cholecalciferol  (VITAMIN D3) 50 MCG (2000 UT) TABS Take 1 tablet (2,000 Units total) by mouth in the morning. 05/15/24     donepezil  (ARICEPT ) 5 MG tablet Take 1 tablet (5 mg total) by mouth at bedtime. 06/13/24     dorzolamide -timolol  (COSOPT ) 2-0.5 % ophthalmic solution Place 1 drop into both eyes every 12 (twelve) hours. 07/11/23     dorzolamide -timolol  (COSOPT ) 22.3-6.8 MG/ML ophthalmic solution PLACE 1 DROP IN [facility] EYE EVERY 12 HOURS 10/23/15   [provider]  ibuprofen  (ADVIL ) 200 MG tablet Take 1 tablet (200 mg total) by mouth every morning. 05/15/24     latanoprost  (XALATAN ) 0.005 % ophthalmic solution Place 1 drop into both eyes at bedtime.  12/18/15   [provider]  latanoprost  (XALATAN ) 0.005 % ophthalmic solution Place 1 drop into both eyes at bedtime. 06/07/23     memantine  (NAMENDA ) 5 MG tablet Take 2 tablets (10 mg total) by mouth 2 (two) times daily with breakfast and at bedtime. 07/15/23     mirabegron  ER (MYRBETRIQ ) 50 MG TB24 tablet Take 50 mg by mouth every evening.  04/26/16  [provider]  tamsulosin  (FLOMAX ) 0.4 MG CAPS capsule Take 1 capsule (0.4 mg total) by mouth daily. Use for urinary retention. Patient taking differently: Take 0.4 mg by mouth every evening. Use for urinary retention. 12/27/15   Mikhail, Maryann, DO  tamsulosin  (FLOMAX ) 0.4 MG CAPS capsule Take 1 capsule (0.4 mg total) by mouth daily at 12 noon. 03/08/24     trimethoprim  (TRIMPEX ) 100 MG tablet Take 1 tablet (100 mg total) by mouth every evening. 07/15/23     venlafaxine  XR (EFFEXOR -XR) 150 MG 24 hr capsule Take 1 capsule (150 mg total) by mouth daily with  breakfast. 07/15/23                                                                                                                                       Past Surgical History History reviewed. No pertinent surgical history. Family History Family History  Problem Relation Age of Onset   Mental illness Mother    Cancer Father     Social History Social History   Tobacco Use   Smoking status: Never   Smokeless tobacco: Never  Substance Use Topics   Alcohol use: No   Drug use: Never   Allergies Hydrocodone  Review of Systems Review of Systems  All other systems reviewed and are negative.   Physical Exam Vital Signs  I have reviewed the triage vital signs BP (!) 142/91   Pulse 80   Temp 98 F (36.7 C)   Resp 18   SpO2 99%  Physical Exam Vitals and nursing note reviewed.  Constitutional:      General: He is not in acute distress.    Appearance: Normal appearance.  HENT:     Mouth/Throat:     Mouth: Mucous membranes are moist.  Eyes:     Conjunctiva/sclera: Conjunctivae normal.  Cardiovascular:     Rate and Rhythm: Normal rate and regular rhythm.  Pulmonary:     Effort: Pulmonary effort is normal. No respiratory distress.     Breath sounds: Normal breath sounds.  Abdominal:     General: Abdomen is flat.     Palpations: Abdomen is soft.     Tenderness: There is no abdominal tenderness.  Musculoskeletal:     Right lower leg: No edema.     Left lower leg: No edema.     Comments: No midline C, T, L-spine tenderness.  No chest wall tenderness or crepitus.  Full painless range of motion at the bilateral upper extremities including the shoulders, elbows, wrists, hand and fingers, and in the bilateral lower extremities including the hips, knees, ankle, toes.  No focal bony tenderness, injury or deformity.  Skin:    General: Skin is warm and dry.     Capillary Refill: Capillary refill takes less than 2 seconds.  Neurological:     Mental Status: He is alert. Mental  status is at baseline.  Comments: Moves all 4 extremities equally, no focal deficit.  Cranial nerves II through XII intact.  Oriented to self, place.  Does not know the year or why he is here.  Psychiatric:        Mood and Affect: Mood normal.        Behavior: Behavior normal.     ED Results and Treatments Labs (all labs ordered are listed, but only abnormal results are displayed) Labs Reviewed  COMPREHENSIVE METABOLIC PANEL WITH GFR - Abnormal; Notable for the following components:      Result Value   Chloride 97 (*)    Glucose, Bld 109 (*)    All other components within normal limits  CBC WITH DIFFERENTIAL/PLATELET - Abnormal; Notable for the following components:   MCV 100.2 (*)    All other components within normal limits  URINALYSIS, W/ REFLEX TO CULTURE (INFECTION SUSPECTED) - Abnormal; Notable for the following components:   Hgb urine dipstick SMALL (*)    Ketones, ur 20 (*)    Bacteria, UA RARE (*)    All other components within normal limits  URINE CULTURE                                                                                                                          Radiology CT Cervical Spine Wo Contrast Result Date: 06/30/2024 CLINICAL DATA:  Clemens, syncope, neck trauma EXAM: CT CERVICAL SPINE WITHOUT CONTRAST TECHNIQUE: Multidetector CT imaging of the cervical spine was performed without intravenous contrast. Multiplanar CT image reconstructions were also generated. RADIATION DOSE REDUCTION: This exam was performed according to the departmental dose-optimization program which includes automated exposure control, adjustment of the mA and/or kV according to patient size and/or use of iterative reconstruction technique. COMPARISON:  None Available. FINDINGS: Alignment: Alignment is grossly anatomic. Skull base and vertebrae: No acute fracture. No primary bone lesion or focal pathologic process. Soft tissues and spinal canal: No prevertebral fluid or swelling. No  visible canal hematoma. Disc levels: Diffuse cervical spondylosis and facet hypertrophy. There is partial bony fusion at C2-3 and C4-5. Marked anterior osteophyte at C6-7 and C7-T1. Upper chest: Airway is patent.  Lung apices are clear. Other: Reconstructed images demonstrate no additional findings. IMPRESSION: 1. No acute cervical spine fracture. 2. Multilevel cervical degenerative changes. Electronically Signed   By: Ozell Daring M.D.   On: 06/30/2024 17:40   CT Head Wo Contrast Result Date: 06/30/2024 CLINICAL DATA:  Clemens, weakness, syncope EXAM: CT HEAD WITHOUT CONTRAST TECHNIQUE: Contiguous axial images were obtained from the base of the skull through the vertex without intravenous contrast. RADIATION DOSE REDUCTION: This exam was performed according to the departmental dose-optimization program which includes automated exposure control, adjustment of the mA and/or kV according to patient size and/or use of iterative reconstruction technique. COMPARISON:  11/06/2021 FINDINGS: Brain: Stable diffuse cerebral atrophy with ex vacuo dilatation of the lateral ventricles, age appropriate. Chronic small vessel ischemic changes within the periventricular white matter. No acute infarct  or hemorrhage. Remaining midline structures are unremarkable. No acute extra-axial fluid collections. No mass effect. Vascular: No hyperdense vessel or unexpected calcification. Skull: Normal. Negative for fracture or focal lesion. Sinuses/Orbits: No acute finding. Other: None. IMPRESSION: 1. Stable head CT, no acute intracranial process. Electronically Signed   By: Ozell Daring M.D.   On: 06/30/2024 17:38   DG Chest Portable 1 View Result Date: 06/30/2024 CLINICAL DATA:  Fall. EXAM: PORTABLE CHEST 1 VIEW COMPARISON:  Chest radiograph dated 11/06/2021. FINDINGS: The heart size and mediastinal contours are within normal limits. Both lungs are clear. The visualized skeletal structures are unremarkable. IMPRESSION: No active  disease. Electronically Signed   By: Vanetta Chou M.D.   On: 06/30/2024 15:53    Pertinent labs & imaging results that were available during my care of the patient were reviewed by me and considered in my medical decision making (see MDM for details).  Medications Ordered in ED Medications  sodium chloride  0.9 % bolus 1,000 mL (0 mLs Intravenous Stopped 06/30/24 2104)  cefTRIAXone  (ROCEPHIN ) 1 g in sodium chloride  0.9 % 100 mL IVPB (0 g Intravenous Stopped 06/30/24 2145)                                                                                                                                     Procedures Procedures  (including critical care time)  Medical Decision Making / ED Course   MDM:  89 year old presenting to the emergency department with fall, weakness.  Patient overall well-appearing, vital signs reassuring.  Physical examination without focal deficit other than baseline dementia.  Seems falls most likely due to mechanical fall given that his shoes were half off..  Also possible that he had a syncopal episode but seems less likely.  Patient is demented and does not remember falling and it was unwitnessed.  No evidence of any trauma but will obtain CT head and cervical spine.  Will check basic labs, urinalysis.  Has had prior UTI.  Patient denies any complaints currently and family have not noted any focal symptoms noted at home.  Has chronic fecal and urinary incontinence which is unchanged.  No focal deficit.  Differential includes toxic or metabolic process, will check labs as well.  Will reassess. Clinical Course as of 06/30/24 2314  Sat Jun 30, 2024  2312 Workup with possible uti, some WBC and bacteria on UA, but no leuks or nitrites. Patient was unable to ambulate at all but was able to bear weight but couldn't remain standing. Wife reports relatively sudden decline so feel patient would benefit from admission for further monitoring/workup. Discussed with Dr.  Sim who will admit.  [WS]    Clinical Course User Index [WS] Francesca Elsie CROME, MD     Additional history obtained: -Additional history obtained from spouse -External records from outside source obtained and reviewed including: Chart review including previous notes, labs, imaging, consultation notes including prior notes  Lab Tests: -I ordered, reviewed, and interpreted labs.   The pertinent results include:   Labs Reviewed  COMPREHENSIVE METABOLIC PANEL WITH GFR - Abnormal; Notable for the following components:      Result Value   Chloride 97 (*)    Glucose, Bld 109 (*)    All other components within normal limits  CBC WITH DIFFERENTIAL/PLATELET - Abnormal; Notable for the following components:   MCV 100.2 (*)    All other components within normal limits  URINALYSIS, W/ REFLEX TO CULTURE (INFECTION SUSPECTED) - Abnormal; Notable for the following components:   Hgb urine dipstick SMALL (*)    Ketones, ur 20 (*)    Bacteria, UA RARE (*)    All other components within normal limits  URINE CULTURE    Notable for possible UTI   Imaging Studies ordered: I ordered imaging studies including CT head, c spine, CXR On my interpretation imaging demonstrates no acute process I independently visualized and interpreted imaging. I agree with the radiologist interpretation   Medicines ordered and prescription drug management: Meds ordered this encounter  Medications   sodium chloride  0.9 % bolus 1,000 mL   cefTRIAXone  (ROCEPHIN ) 1 g in sodium chloride  0.9 % 100 mL IVPB    Antibiotic Indication::   UTI   cefdinir (OMNICEF) 300 MG capsule    Sig: Take 1 capsule (300 mg total) by mouth 2 (two) times daily for 7 days.    Dispense:  14 capsule    Refill:  0    -I have reviewed the patients home medicines and have made adjustments as needed   Consultations Obtained: I requested consultation with the hospitalist,  and discussed lab and imaging findings as well as pertinent plan  - they recommend: admission  Reevaluation: After the interventions noted above, I reevaluated the patient and found that their symptoms have stayed the same  Co morbidities that complicate the patient evaluation  Past Medical History:  Diagnosis Date   Dementia (HCC)    Depression    Glaucoma    Renal disorder       Dispostion: Disposition decision including need for hospitalization was considered, and patient admitted to the hospital.    Final Clinical Impression(s) / ED Diagnoses Final diagnoses:  Fall in home, initial encounter  Weakness     This chart was dictated using voice recognition software.  Despite best efforts to proofread,  errors can occur which can change the documentation meaning.    Francesca Elsie CROME, MD 06/30/24 [phone]

## 2024-06-30 NOTE — ED Notes (Signed)
 Previous shift attempted in and out without success. Provider made aware. Provider advised coude cath be attempted.

## 2024-06-30 NOTE — ED Triage Notes (Signed)
 Pt brought in by family after fall at home.  Pt reports normally walking without cane or walker. Reports weakness.  Reports he passed out and fell, did not trip and fell.  Family is unsure of details and reports pt is probably not aware of what exactly happened.  No complaints of pain.  No thinners.  Family reports pt is not eating and drinking as well as usual.  Baseline incontinent of bowel and bladder although family reports the bowel incontinence is fairly new

## 2024-06-30 NOTE — H&P (Signed)
 History and Physical    Patient: Chad Holden FMW:[id number] DOB: 10/12/1933 DOA: 06/30/2024 DOS: the patient was seen and examined on 06/30/2024 PCP: Charlott Dorn LABOR, MD  Patient coming from: Home  Chief Complaint:  Chief Complaint  Patient presents with   Fall   HPI: Chad Holden is a 89 y.o. male with medical history significant of dementia, depression, chronic kidney disease, glaucoma, who was brought in by family after a fall at home.  He normally walks without a cane or walker.  Patient reports weakness.  Patient said he passed out and fell but did not trip or fell.  The family with the patient did not know exactly what happened.  No pain.  He is weak now unable to walk.  They think he may have UTI.  Urinalysis not very convincing however patient seems to have been weaker than usual.  Patient is being admitted for observation and possible workup of UTI.  Review of Systems: As mentioned in the history of present illness. All other systems reviewed and are negative. Past Medical History:  Diagnosis Date   Dementia (HCC)    Depression    Glaucoma    Renal disorder    History reviewed. No pertinent surgical history. Social History:  reports that he has never smoked. He has never used smokeless tobacco. He reports that he does not drink alcohol and does not use drugs.  Allergies  Allergen Reactions   Hydrocodone     Zombie    Family History  Problem Relation Age of Onset   Mental illness Mother    Cancer Father     Prior to Admission medications   Medication Sig Start Date End Date Taking? Authorizing Provider  cefdinir (OMNICEF) 300 MG capsule Take 1 capsule (300 mg total) by mouth 2 (two) times daily for 7 days. 06/30/24 07/07/24 Yes Francesca Elsie CROME, MD  acetaminophen  (TYLENOL ) 500 MG tablet Take 2 tablets (1,000 mg total) by mouth 2 (two) times daily for joint/back pain 12/22/23     aspirin  EC (ASPIRIN  ADULT LOW DOSE) 81 MG tablet Take 1 tablet (81 mg  total) by mouth every evening. 12/22/23     atorvastatin  (LIPITOR ) 40 MG tablet Take 1 tablet (40 mg total) by mouth daily. 02/01/18 02/01/19  Shona Terry SAILOR, DO  Cholecalciferol  (VITAMIN D3) 50 MCG (2000 UT) TABS Take 1 tablet (2,000 Units total) by mouth in the morning. 05/15/24     donepezil  (ARICEPT ) 5 MG tablet Take 1 tablet (5 mg total) by mouth at bedtime. 06/13/24     dorzolamide -timolol  (COSOPT ) 2-0.5 % ophthalmic solution Place 1 drop into both eyes every 12 (twelve) hours. 07/11/23     dorzolamide -timolol  (COSOPT ) 22.3-6.8 MG/ML ophthalmic solution PLACE 1 DROP IN [facility] EYE EVERY 12 HOURS 10/23/15   [provider]  ibuprofen  (ADVIL ) 200 MG tablet Take 1 tablet (200 mg total) by mouth every morning. 05/15/24     latanoprost  (XALATAN ) 0.005 % ophthalmic solution Place 1 drop into both eyes at bedtime.  12/18/15   [provider]  latanoprost  (XALATAN ) 0.005 % ophthalmic solution Place 1 drop into both eyes at bedtime. 06/07/23     memantine  (NAMENDA ) 5 MG tablet Take 2 tablets (10 mg total) by mouth 2 (two) times daily with breakfast and at bedtime. 07/15/23     mirabegron  ER (MYRBETRIQ ) 50 MG TB24 tablet Take 50 mg by mouth every evening.  04/26/16   [provider]  tamsulosin  (FLOMAX ) 0.4 MG CAPS capsule Take  1 capsule (0.4 mg total) by mouth daily. Use for urinary retention. Patient taking differently: Take 0.4 mg by mouth every evening. Use for urinary retention. 12/27/15   Mikhail, Maryann, DO  tamsulosin  (FLOMAX ) 0.4 MG CAPS capsule Take 1 capsule (0.4 mg total) by mouth daily at 12 noon. 03/08/24     trimethoprim  (TRIMPEX ) 100 MG tablet Take 1 tablet (100 mg total) by mouth every evening. 07/15/23     venlafaxine  XR (EFFEXOR -XR) 150 MG 24 hr capsule Take 1 capsule (150 mg total) by mouth daily with breakfast. 07/15/23       Physical Exam: Vitals:   06/30/24 1508 06/30/24 1852 06/30/24 1909 06/30/24 2130  BP: 126/89 134/87  (!) 142/91  Pulse: 88 85  80  Resp: 18 16  18    Temp: 98.1 F (36.7 C)  98 F (36.7 C)   TempSrc: Oral     SpO2: 96% 99%  99%   Constitutional: Confused, weak, NAD, calm, comfortable Eyes: PERRL, lids and conjunctivae normal ENMT: Mucous membranes are moist. Posterior pharynx clear of any exudate or lesions.Normal dentition.  Neck: normal, supple, no masses, no thyromegaly Respiratory: clear to auscultation bilaterally, no wheezing, no crackles. Normal respiratory effort. No accessory muscle use.  Cardiovascular: Regular rate and rhythm, no murmurs / rubs / gallops. No extremity edema. 2+ pedal pulses. No carotid bruits.  Abdomen: no tenderness, no masses palpated. No hepatosplenomegaly. Bowel sounds positive.  Musculoskeletal: Good range of motion, no joint swelling or tenderness, Skin: no rashes, lesions, ulcers. No induration Neurologic: CN 2-12 grossly intact. Sensation intact, DTR normal. Strength 5/5 in all 4.  Psychiatric: Normal judgment and insight. Alert and oriented x 3. Normal mood  Data Reviewed:  Temperature 98.1, blood pressure 140/91, pulse 88 respiratory rate of 18, CBC and chemistry within normal.  Chest x-ray showed no active disease head CT without contrast shows stable findings CT cervical spine showed no acute findings mainly multilevel cervical degenerative changes  Assessment and Plan:  #1 status post fall: Appears to be generalized weakness.  Could be global weakness with early UTI or other global weakness.  Will get PT and OT.  Admit the patient for evaluation.  #2 possible UTI: Empiric antibiotics.  Continue treatment  #3 acute metabolic encephalopathy: Probably secondary to acute illness.  Continue to monitor  #4 prior history of urinary retention: No evidence of retention at the moment.  If patient's urine output is less we will try renal ultrasound  #5 hyperlipidemia: Continue home regimen  #6 dementia: Stable.  No behavioral changes.  #7 Major depression: Stable continue home regimen     Advance Care Planning:   Code Status: Do not attempt resuscitation (DNR) PRE-ARREST INTERVENTIONS DESIRED   Consults: PT and OT  Family Communication: Spouse at bedside  Severity of Illness: The appropriate patient status for this patient is OBSERVATION. Observation status is judged to be reasonable and necessary in order to provide the required intensity of service to ensure the patient's safety. The patient's presenting symptoms, physical exam findings, and initial radiographic and laboratory data in the context of their medical condition is felt to place them at decreased risk for further clinical deterioration. Furthermore, it is anticipated that the patient will be medically stable for discharge from the hospital within 2 midnights of admission.   AuthorBETHA SIM KNOLL, MD 06/30/2024 10:16 PM  For on call review [email or web address].

## 2024-07-01 ENCOUNTER — Encounter (HOSPITAL_COMMUNITY): Payer: Self-pay | Admitting: Internal Medicine

## 2024-07-01 DIAGNOSIS — N3941 Urge incontinence: Secondary | ICD-10-CM | POA: Diagnosis not present

## 2024-07-01 DIAGNOSIS — W19XXXD Unspecified fall, subsequent encounter: Secondary | ICD-10-CM

## 2024-07-01 DIAGNOSIS — R339 Retention of urine, unspecified: Secondary | ICD-10-CM | POA: Diagnosis not present

## 2024-07-01 DIAGNOSIS — N390 Urinary tract infection, site not specified: Secondary | ICD-10-CM | POA: Diagnosis not present

## 2024-07-01 LAB — COMPREHENSIVE METABOLIC PANEL WITH GFR
ALT: 9 U/L (ref 0–44)
AST: 18 U/L (ref 15–41)
Albumin: 3.6 g/dL (ref 3.5–5.0)
Alkaline Phosphatase: 71 U/L (ref 38–126)
Anion gap: 8 (ref 5–15)
BUN: 18 mg/dL (ref 8–23)
CO2: 28 mmol/L (ref 22–32)
Calcium: 9.3 mg/dL (ref 8.9–10.3)
Chloride: 101 mmol/L (ref 98–111)
Creatinine, Ser: 0.87 mg/dL (ref 0.61–1.24)
GFR, Estimated: >60 mL/min (ref >60)
Glucose, Bld: 153 mg/dL — ABNORMAL HIGH (ref 70–99)
Potassium: 4.1 mmol/L (ref 3.5–5.1)
Sodium: 137 mmol/L (ref 135–145)
Total Bilirubin: 0.3 mg/dL (ref 0.0–1.2)
Total Protein: 6.1 g/dL — ABNORMAL LOW (ref 6.5–8.1)

## 2024-07-01 LAB — URINE CULTURE: Culture: NO GROWTH

## 2024-07-01 LAB — CBC
HCT: 35.9 % — ABNORMAL LOW (ref 39.0–52.0)
HCT: 42.2 % (ref 39.0–52.0)
Hemoglobin: 11.7 g/dL — ABNORMAL LOW (ref 13.0–17.0)
Hemoglobin: 12.7 g/dL — ABNORMAL LOW (ref 13.0–17.0)
MCH: 32.3 pg (ref 26.0–34.0)
MCH: 32.2 pg (ref 26.0–34.0)
MCHC: 32.6 g/dL (ref 30.0–36.0)
MCHC: 30.1 g/dL (ref 30.0–36.0)
MCV: 99.2 fL (ref 80.0–100.0)
MCV: 106.8 fL — ABNORMAL HIGH (ref 80.0–100.0)
Platelets: 136 K/uL — ABNORMAL LOW (ref 150–400)
Platelets: 149 K/uL — ABNORMAL LOW (ref 150–400)
RBC: 3.62 MIL/uL — ABNORMAL LOW (ref 4.22–5.81)
RBC: 3.95 MIL/uL — ABNORMAL LOW (ref 4.22–5.81)
RDW: 12.7 % (ref 11.5–15.5)
RDW: 12.7 % (ref 11.5–15.5)
WBC: 7.5 K/uL (ref 4.0–10.5)
WBC: 8.7 K/uL (ref 4.0–10.5)
nRBC: 0 % (ref 0.0–0.2)
nRBC: 0 % (ref 0.0–0.2)

## 2024-07-01 MED ORDER — SODIUM CHLORIDE 0.9 % IV SOLN
1.0000 g | INTRAVENOUS | Status: DC
  Administered 2024-07-01 – 2024-07-02 (×2): 1 g via INTRAVENOUS
  Filled 2024-07-01 (×3): qty 10

## 2024-07-01 MED ORDER — TAMSULOSIN HCL 0.4 MG PO CAPS: 0.4000 mg | ORAL_CAPSULE | Freq: Every day | ORAL | Status: DC

## 2024-07-01 MED ORDER — IBUPROFEN 200 MG PO TABS
200.0000 mg | ORAL_TABLET | Freq: Every day | ORAL | Status: DC
  Administered 2024-07-01 – 2024-07-06 (×6): 200 mg via ORAL
  Filled 2024-07-01 (×6): qty 1

## 2024-07-01 MED ORDER — TAMSULOSIN HCL 0.4 MG PO CAPS
0.4000 mg | ORAL_CAPSULE | Freq: Every evening | ORAL | Status: DC
Start: 2024-07-01 — End: 2024-07-06
  Administered 2024-07-01 – 2024-07-05 (×5): 0.4 mg via ORAL
  Filled 2024-07-01 (×5): qty 1

## 2024-07-01 MED ORDER — ONDANSETRON HCL 4 MG PO TABS: 4.0000 mg | ORAL_TABLET | Freq: Four times a day (QID) | ORAL | Status: DC | PRN

## 2024-07-01 MED ORDER — CHLORHEXIDINE GLUCONATE CLOTH 2 % EX PADS
6.0000 | MEDICATED_PAD | Freq: Every day | CUTANEOUS | Status: DC
  Administered 2024-07-01 – 2024-07-06 (×6): 6 via TOPICAL

## 2024-07-01 MED ORDER — ENOXAPARIN SODIUM 40 MG/0.4ML IJ SOSY
40.0000 mg | PREFILLED_SYRINGE | INTRAMUSCULAR | Status: DC
  Administered 2024-07-01 – 2024-07-06 (×6): 40 mg via SUBCUTANEOUS
  Filled 2024-07-01 (×6): qty 0.4

## 2024-07-01 MED ORDER — MIRABEGRON ER 25 MG PO TB24
50.0000 mg | ORAL_TABLET | Freq: Every evening | ORAL | Status: DC
  Administered 2024-07-01 – 2024-07-05 (×5): 50 mg via ORAL
  Filled 2024-07-01 (×6): qty 2

## 2024-07-01 MED ORDER — ASPIRIN 81 MG PO TBEC
81.0000 mg | DELAYED_RELEASE_TABLET | Freq: Every evening | ORAL | Status: DC
  Administered 2024-07-01 – 2024-07-05 (×5): 81 mg via ORAL
  Filled 2024-07-01 (×5): qty 1

## 2024-07-01 MED ORDER — ACETAMINOPHEN 500 MG PO TABS
1000.0000 mg | ORAL_TABLET | Freq: Two times a day (BID) | ORAL | Status: DC
  Administered 2024-07-01 – 2024-07-06 (×11): 1000 mg via ORAL
  Filled 2024-07-01 (×12): qty 2

## 2024-07-01 MED ORDER — ENSURE PLUS HIGH PROTEIN PO LIQD
237.0000 mL | Freq: Two times a day (BID) | ORAL | Status: DC
  Administered 2024-07-01 – 2024-07-06 (×11): 237 mL via ORAL

## 2024-07-01 MED ORDER — QUETIAPINE FUMARATE 25 MG PO TABS
25.0000 mg | ORAL_TABLET | Freq: Every day | ORAL | Status: DC
  Administered 2024-07-01: 25 mg via ORAL
  Filled 2024-07-01: qty 1

## 2024-07-01 MED ORDER — ONDANSETRON HCL 4 MG/2ML IJ SOLN: 4.0000 mg | Freq: Four times a day (QID) | INTRAMUSCULAR | Status: DC | PRN

## 2024-07-01 MED ORDER — DEXTROSE-SODIUM CHLORIDE 5-0.45 % IV SOLN: INTRAVENOUS | Status: AC

## 2024-07-01 MED ORDER — DONEPEZIL HCL 5 MG PO TABS
5.0000 mg | ORAL_TABLET | Freq: Every day | ORAL | Status: DC
  Administered 2024-07-01 (×2): 5 mg via ORAL
  Filled 2024-07-01 (×3): qty 1

## 2024-07-01 MED ORDER — DORZOLAMIDE HCL-TIMOLOL MAL 2-0.5 % OP SOLN
1.0000 [drp] | Freq: Two times a day (BID) | OPHTHALMIC | Status: DC
  Administered 2024-07-01 – 2024-07-06 (×11): 1 [drp] via OPHTHALMIC
  Filled 2024-07-01: qty 10

## 2024-07-01 MED ORDER — LATANOPROST 0.005 % OP SOLN
1.0000 [drp] | Freq: Every day | OPHTHALMIC | Status: DC
  Administered 2024-07-01 – 2024-07-05 (×5): 1 [drp] via OPHTHALMIC
  Filled 2024-07-01: qty 2.5

## 2024-07-01 MED ORDER — MEMANTINE HCL 10 MG PO TABS
10.0000 mg | ORAL_TABLET | Freq: Two times a day (BID) | ORAL | Status: DC
  Administered 2024-07-01 – 2024-07-02 (×4): 10 mg via ORAL
  Filled 2024-07-01 (×6): qty 1

## 2024-07-01 MED ORDER — DEXTROSE IN LACTATED RINGERS 5 % IV SOLN: INTRAVENOUS | Status: DC

## 2024-07-01 NOTE — Progress Notes (Signed)
 Progress Note   Patient: Chad Holden DOB: 1933/11/22 DOA: 06/30/2024     0 DOS: the patient was seen and examined on 07/01/2024   Brief hospital course: HPI: Chad Holden is a 89 y.o. male with medical history significant of dementia, depression, chronic kidney disease, glaucoma, who was brought in by family after a fall at home.  He normally walks without a cane or walker.  Patient reports weakness.  Patient said he passed out and fell but did not trip or fell.  The family with the patient did not know exactly what happened.  No pain.  He is weak now unable to walk.  They think he may have UTI.  Urinalysis not very convincing however patient seems to have been weaker than usual.  Patient is being admitted for observation and possible workup of UTI.    Assessment and Plan: S/p Fall  Unclear etiology possibly mechanical.  Will consult PT/OT   Possible UTI Continue antibiotics  Dementia Appears at baseline  H/o urinary retention BS with 500cc foley placed. Patient had minimal output. Had ep of incontinence prior to physician urology team placing foley.  -continue foley  -continue flomax  and home mybetriq   Dementia  Continue home medications.   [place] depression  Continue home medications         Subjective: Had some urinary retention, physician urology team had to be called to place foley. Patient had some urine leak around foley and some blood tinged urine.   Physical Exam: Vitals:   07/01/24 0845 07/01/24 0847 07/01/24 1100 07/01/24 1754  BP: 105/68 (!) 90/59 (!) 108/58 118/74  Pulse: 92 (!) 116 94 97  Resp:   18   Temp:    97.7 F (36.5 C)  TempSrc:    Oral  SpO2:  98%    Weight:   93 kg   Height:   5' 10 (1.778 m)    Physical Exam  Constitutional: In no distress. Pleasnatly confused  Cardiovascular: Normal rate, regular rhythm. No lower extremity edema  Pulmonary: Non labored breathing on room air, no wheezing or rales.   Abdominal:  Soft. Non distended and non tender  Neurological: Alert Skin: Skin is warm and dry.   Data Reviewed:     Latest Ref Rng & Units 07/01/2024    3:15 AM 06/30/2024    5:08 PM 11/06/2021   10:45 PM  BMP  Glucose 70 - 99 mg/dL 846  890  866   BUN 8 - 23 mg/dL 18  21  19    Creatinine 0.61 - 1.24 mg/dL 9.12  9.06  8.72   Sodium 135 - 145 mmol/L 137  137  136   Potassium 3.5 - 5.1 mmol/L 4.1  4.5  4.3   Chloride 98 - 111 mmol/L 101  97  103   CO2 22 - 32 mmol/L 28  28  26    Calcium  8.9 - 10.3 mg/dL 9.3  89.6  9.4       Latest Ref Rng & Units 07/01/2024   12:15 PM 07/01/2024    3:15 AM 06/30/2024    5:08 PM  CBC  WBC 4.0 - 10.5 K/uL 8.7  7.5  6.8   Hemoglobin 13.0 - 17.0 g/dL 87.2  88.2  84.5   Hematocrit 39.0 - 52.0 % 42.2  35.9  46.7   Platelets 150 - 400 K/uL 149  136  155      Family Communication: spouse, and son   Disposition: Status  is: Observation The patient remains OBS appropriate and will d/c before 2 midnights.  Planned Discharge Destination: pending PT/OT eval    Time spent: 35 minutes  Author: Alban Pepper, MD 07/01/2024 7:16 PM  For on call review [email or web address].

## 2024-07-01 NOTE — Evaluation (Signed)
 Physical Therapy Evaluation Patient Details Name: Chad Holden MRN: [id number] DOB: 03-Jul-1934 Today's Date: 07/01/2024  History of Present Illness  89 yo male admitted with weakness, possible UTI, inablity to ambulate after sustaining fall at home. Hx of TIA, falls, dementia, CKD, depression, glaucoma  Clinical Impression  On eval, pt required Mod A +2 for mobility. He was able to stand and pivot over to recliner. Pt required Mod A +2 for safe mobility. Orthostatic vitals in flowsheet section. Pt is at risk for falls when mobilizing. Wife present during session-reports plan is for home. Will plan to follow and progress activity as tolerated.       If plan is discharge home, recommend the following: Two people to help with walking and/or transfers;A lot of help with bathing/dressing/bathroom;Assistance with cooking/housework;Assist for transportation;Help with stairs or ramp for entrance   Can travel by private vehicle        Equipment Recommendations None recommended by PT  Recommendations for Other Services       Functional Status Assessment Patient has had a recent decline in their functional status and demonstrates the ability to make significant improvements in function in a reasonable and predictable amount of time.     Precautions / Restrictions Precautions Precautions: Fall Precaution/Restrictions Comments: hypotension Restrictions Weight Bearing Restrictions Per Provider Order: No      Mobility  Bed Mobility Overal bed mobility: Needs Assistance Bed Mobility: Supine to Sit     Supine to sit: Mod assist, HOB elevated     General bed mobility comments: Assist for bil LEs and to scoot to EOB. Increased time. Repeated multimodal cueing required.    Transfers Overall transfer level: Needs assistance Equipment used: Rolling walker (2 wheels) Transfers: Sit to/from Stand, Bed to chair/wheelchair/BSC Sit to Stand: Mod assist, +2 physical assistance, +2  safety/equipment   Step pivot transfers: Mod assist, +2 physical assistance, +2 safety/equipment       General transfer comment: Assist to power up, stabilize, control descent. Repeated multimodal cueing required. Used RW. Fall risk.    Ambulation/Gait                  Stairs            Wheelchair Mobility     Tilt Bed    Modified Rankin (Stroke Patients Only)       Balance Overall balance assessment: Needs assistance, History of Falls         Standing balance support: Bilateral upper extremity supported, During functional activity, Reliant on assistive device for balance Standing balance-Leahy Scale: Poor                               Pertinent Vitals/Pain Pain Assessment Pain Assessment: Faces Faces Pain Scale: No hurt    Home Living Family/patient expects to be discharged to:: Private residence Living Arrangements: Spouse/significant other Available Help at Discharge: Family;Available 24 hours/day Type of Home: House Home Access: Stairs to enter Entrance Stairs-Rails: Right Entrance Stairs-Number of Steps: 2   Home Layout: One level Home Equipment: Agricultural consultant (2 wheels);Cane - single point      Prior Function Prior Level of Function : Needs assist             Mobility Comments: uses RW when first gets up then can usually slowly walk around the house ADLs Comments: wife assists with feeding, bathing, dressing     Extremity/Trunk Assessment   Upper Extremity Assessment  Upper Extremity Assessment: Generalized weakness    Lower Extremity Assessment Lower Extremity Assessment: Generalized weakness    Cervical / Trunk Assessment Cervical / Trunk Assessment: Kyphotic  Communication   Communication Communication: No apparent difficulties    Cognition Arousal: Alert Behavior During Therapy: Flat affect   PT - Cognitive impairments: History of cognitive impairments                         Following  commands: Impaired Following commands impaired: Follows one step commands with increased time, Follows one step commands inconsistently     Cueing Cueing Techniques: Verbal cues, Tactile cues, Visual cues     General Comments      Exercises     Assessment/Plan    PT Assessment Patient needs continued PT services  PT Problem List Decreased strength;Decreased range of motion;Decreased activity tolerance;Decreased balance;Decreased mobility;Decreased cognition;Decreased coordination;Decreased safety awareness       PT Treatment Interventions DME instruction;Gait training;Functional mobility training;Therapeutic activities;Therapeutic exercise;Patient/family education;Balance training    PT Goals (Current goals can be found in the Care Plan section)  Acute Rehab PT Goals Patient Stated Goal: per wife, home. PT Goal Formulation: With family Time For Goal Achievement: 07/15/24 Potential to Achieve Goals: Good    Frequency Min 3X/week     Co-evaluation               AM-PAC PT 6 Clicks Mobility  Outcome Measure Help needed turning from your back to your side while in a flat bed without using bedrails?: A Lot Help needed moving from lying on your back to sitting on the side of a flat bed without using bedrails?: A Lot Help needed moving to and from a bed to a chair (including a wheelchair)?: A Lot Help needed standing up from a chair using your arms (e.g., wheelchair or bedside chair)?: A Lot Help needed to walk in hospital room?: A Lot Help needed climbing 3-5 steps with a railing? : Total 6 Click Score: 11    End of Session Equipment Utilized During Treatment: Gait belt Activity Tolerance: Patient tolerated treatment well Patient left: in chair;with call bell/phone within reach;with chair alarm set;with family/visitor present   PT Visit Diagnosis: Muscle weakness (generalized) (M62.81);History of falling (Z91.81);Difficulty in walking, not elsewhere classified  (R26.2);Unsteadiness on feet (R26.81)    Time: 8593-8571 PT Time Calculation (min) (ACUTE ONLY): 22 min   Charges:   PT Evaluation $PT Eval Low Complexity: 1 Low   PT General Charges $$ ACUTE PT VISIT: 1 Visit           Dannial SQUIBB, PT Acute Rehabilitation  Office: [phone]

## 2024-07-01 NOTE — Progress Notes (Signed)
 Coude 77F I&O cath was attempted per request of primary RN. Resistance met with insertion. Insertion unsuccessful. Primary RN made aware.

## 2024-07-01 NOTE — Care Management Obs Status (Signed)
 MEDICARE OBSERVATION STATUS NOTIFICATION   Patient Details  Name: AQUARIUS LATOUCHE MRN: [id number] Date of Birth: 05-28-1934   Medicare Observation Status Notification Given:  Yes    Sonda Manuella Quill, RN 07/01/2024, 12:53 PM

## 2024-07-01 NOTE — Progress Notes (Addendum)
 Attempted insertion of straight cath X 2, unable to obtain urine, resistance met. Sm amt of bleeding noted from meatus, MD notified

## 2024-07-01 NOTE — Consult Note (Signed)
 I have been asked to see the patient by Dr. Alban Pepper for evaluation and management of urinary retention.  History of present illness: 89 year old man currently admitted s/p cane also with dementia.  Urology was called for difficult Foley placement.  Bladder scan showed approximately 500 cc.  He was last seen at [facility] urology in 2021 by Dr. Gaston and known to have urge incontinence.   Review of systems: Unable to obtain due to patient's mental status  Patient Active Problem List   Diagnosis Date Noted   Major depression 06/30/2024   Pure hypercholesterolemia 06/30/2024   Dementia without behavioral disturbance (HCC) 06/30/2024   Fall 06/30/2024   ARF (acute renal failure) 01/31/2018   TIA (transient ischemic attack) 01/30/2018   Lower urinary tract infectious disease 12/24/2015   Acute encephalopathy 12/24/2015   Leukocytosis 12/24/2015   Urine retention 12/24/2015   Confusion 12/24/2015    No current facility-administered medications on file prior to encounter.   Current Outpatient Medications on File Prior to Encounter  Medication Sig Dispense Refill   acetaminophen  (TYLENOL ) 500 MG tablet Take 2 tablets (1,000 mg total) by mouth 2 (two) times daily for joint/back pain 360 tablet 3   aspirin  EC (ASPIRIN  ADULT LOW DOSE) 81 MG tablet Take 1 tablet (81 mg total) by mouth every evening. 90 tablet 3   Cholecalciferol  (VITAMIN D3) 50 MCG (2000 UT) TABS Take 1 tablet (2,000 Units total) by mouth in the morning. 30 tablet 11   donepezil  (ARICEPT ) 5 MG tablet Take 1 tablet (5 mg total) by mouth at bedtime. 90 tablet 2   dorzolamide -timolol  (COSOPT ) 2-0.5 % ophthalmic solution Place 1 drop into both eyes every 12 (twelve) hours. 20 mL 4   ibuprofen  (ADVIL ) 200 MG tablet Take 1 tablet (200 mg total) by mouth every morning. 30 tablet 11   latanoprost  (XALATAN ) 0.005 % ophthalmic solution Place 1 drop into both eyes at bedtime.      memantine  (NAMENDA ) 5 MG tablet Take 2  tablets (10 mg total) by mouth 2 (two) times daily with breakfast and at bedtime. 360 tablet 2   tamsulosin  (FLOMAX ) 0.4 MG CAPS capsule Take 1 capsule (0.4 mg total) by mouth daily. Use for urinary retention. (Patient taking differently: Take 0.4 mg by mouth every evening. Use for urinary retention.) 30 capsule 0   trimethoprim  (TRIMPEX ) 100 MG tablet Take 1 tablet (100 mg total) by mouth every evening. 90 tablet 2   venlafaxine  XR (EFFEXOR -XR) 150 MG 24 hr capsule Take 1 capsule (150 mg total) by mouth daily with breakfast. 90 capsule 2   atorvastatin  (LIPITOR ) 40 MG tablet Take 1 tablet (40 mg total) by mouth daily. (Patient not taking: Reported on 07/01/2024) 30 tablet 0   mirabegron  ER (MYRBETRIQ ) 50 MG TB24 tablet Take 50 mg by mouth every evening.      tamsulosin  (FLOMAX ) 0.4 MG CAPS capsule Take 1 capsule (0.4 mg total) by mouth daily at 12 noon. (Patient not taking: Reported on 07/01/2024) 90 capsule 1    Past Medical History:  Diagnosis Date   Dementia (HCC)    Depression    Glaucoma    Renal disorder     History reviewed. No pertinent surgical history.  Social History   Tobacco Use   Smoking status: Never   Smokeless tobacco: Never  Substance Use Topics   Alcohol use: No   Drug use: Never    Family History  Problem Relation Age of Onset   Mental illness Mother  Cancer Father     PE: Vitals:   07/01/24 0842 07/01/24 0845 07/01/24 0847 07/01/24 1100  BP: 104/66 105/68 (!) 90/59 (!) 108/58  Pulse: 89 92 (!) 116 94  Resp:    18  Temp:      TempSrc:      SpO2: 97%  98%   Weight:    93 kg  Height:    5' 10 (1.778 m)   Patient appears to be in no acute distress  Atraumatic normocephalic head No increased work of breathing, no audible wheezes/rhonchi Regular sinus rhythm/rate Abdomen is soft, nontender, nondistended Lower extremities are symmetric without appreciable edema   Recent Labs    06/30/24 1708 07/01/24 0315  WBC 6.8 7.5  HGB 15.4 11.7*  HCT  46.7 35.9*   Recent Labs    06/30/24 1708 07/01/24 0315  NA 137 137  K 4.5 4.1  CL 97* 101  CO2 28 28  GLUCOSE 109* 153*  BUN 21 18  CREATININE 0.93 0.87  CALCIUM  10.3 9.3   No results for input(s): LABPT, INR in the last 72 hours. No results for input(s): LABURIN in the last 72 hours. Results for orders placed or performed during the hospital encounter of 11/06/21  Urine Culture     Status: None   Collection Time: 11/07/21  5:40 AM   Specimen: In/Out Cath Urine  Result Value Ref Range Status   Specimen Description   Final    IN/OUT CATH URINE Performed at [facility], [address], [place], [place] [address]    Special Requests   Final    NONE Performed at [facility], [address], [place], [place] [address]    Culture   Final    NO GROWTH Performed at [facility], [address], [place], [place] [address]    Report Status 11/08/2021 FINAL  Final  Resp Panel by RT-PCR (Flu A&B, Covid) Nasopharyngeal Swab     Status: None   Collection Time: 11/07/21  6:08 AM   Specimen: Nasopharyngeal Swab; Nasopharyngeal(NP) swabs in vial transport medium  Result Value Ref Range Status   SARS Coronavirus 2 by RT PCR NEGATIVE NEGATIVE Final    Comment: (NOTE) SARS-CoV-2 target nucleic acids are NOT DETECTED.  The SARS-CoV-2 RNA is generally detectable in upper respiratory specimens during the acute phase of infection. The lowest concentration of SARS-CoV-2 viral copies this assay can detect is 138 copies/mL. A negative result does not preclude SARS-Cov-2 infection and should not be used as the sole basis for treatment or other patient management decisions. A negative result may occur with  improper specimen collection/handling, submission of specimen other than nasopharyngeal swab, presence of viral mutation(s) within the areas targeted by this assay, and inadequate number of viral copies(<138 copies/mL). A negative result  must be combined with clinical observations, patient history, and epidemiological information. The expected result is Negative.  Fact Sheet for Patients:  BloggerCourse.com  Fact Sheet for Healthcare Providers:  SeriousBroker.it  This test is no t yet approved or cleared by the [place]  FDA and  has been authorized for detection and/or diagnosis of SARS-CoV-2 by FDA under an Emergency Use Authorization (EUA). This EUA will remain  in effect (meaning this test can be used) for the duration of the COVID-19 declaration under Section 564(b)(1) of the Act, 21 U.S.C.section 360bbb-3(b)(1), unless the authorization is terminated  or revoked sooner.       Influenza A by PCR NEGATIVE NEGATIVE Final  Influenza B by PCR NEGATIVE NEGATIVE Final    Comment: (NOTE) The Xpert Xpress SARS-CoV-2/FLU/RSV plus assay is intended as an aid in the diagnosis of influenza from Nasopharyngeal swab specimens and should not be used as a sole basis for treatment. Nasal washings and aspirates are unacceptable for Xpert Xpress SARS-CoV-2/FLU/RSV testing.  Fact Sheet for Patients: BloggerCourse.com  Fact Sheet for Healthcare Providers: SeriousBroker.it  This test is not yet approved or cleared by the [place]  FDA and has been authorized for detection and/or diagnosis of SARS-CoV-2 by FDA under an Emergency Use Authorization (EUA). This EUA will remain in effect (meaning this test can be used) for the duration of the COVID-19 declaration under Section 564(b)(1) of the Act, 21 U.S.C. section 360bbb-3(b)(1), unless the authorization is terminated or revoked.  Performed at [facility], [address] [place], [place] [address]      Procedure Preprocedure diagnosis: Urinary retention Postprocedure diagnosis: Urinary retention  Procedure performed: Cystoscopy was difficult  Foley placement  Procedure in detail: Patient was prepped and draped in the usual sterile fashion.  A flexible cystoscope was placed in the urethra meatus and advanced into the bladder under direct visualization.  There was no trauma noted to the ureter.  A 0.38 sensor wire was then advanced through the ureteroscope into the bladder.  The bladder was noted to have severe bladder trabeculations.  It did not appear to be very distended.  The wire was kept in place while removing the cystoscope.  Next a 20 [place] council tip catheter was placed over the wire and advanced into the bladder until it was hubbed.  Return of urine was seen and the balloon was inflated to 10 cc sterile water .  The catheter was then connected to a drainage bag.  Due to increased pain patient was experiencing the catheter was irrigated.  There was some resistance irrigating so the catheter was removed and replaced over a wire.  It then irrigated well.  It did appear that patient may have been having bladder spasms.  Assessment/plan: 1.  Urinary retention 2.  Urge incontinence  - 20 French Foley catheter in place to gravity drainage -Foley per primary team -Patient can have oxybutynin 5 mg 3 times daily as needed severe bladder spasms would hold if possible due to impact on cognitive function -Void trial per primary team.  He likely voids by a lot of involuntary bladder contraction.   Shayne Deerman D Cristina Mattern

## 2024-07-01 NOTE — TOC Initial Note (Signed)
 Transition of Care [facility] - Initial/Assessment Note    Patient Details  Name: Chad Holden MRN: [id number] Date of Birth: [birth year]  Transition of Care [facility]) CM/SW Contact:    Sonda Manuella Quill, RN Phone Number: 07/01/2024, 1:00 PM  Clinical Narrative:                 IP CM for d/c planning; spoke w/ pt and wife Kden Wagster [phone]) in room; she said pt lives at home; she plans for him to return at d/c; she will also provide transportation; insurance/PCP verified; Mrs Bazar denied pt experiencing SDOH risks; pt has cane, walker, and shower chair; he does not have HH services or home oxygen; awaiting PT/OT evals; IP CM will follow.  Expected Discharge Plan: Home/Self Care Barriers to Discharge: Continued Medical Work up   Patient Goals and CMS Choice Patient states their goals for this hospitalization and ongoing recovery are:: home CMS Medicare.gov Compare Post Acute Care list provided to:: Patient Represenative (must comment) Ordean Fouts (spouse))   [place] ownership interest in [facility].provided to:: Spouse    Expected Discharge Plan and Services   Discharge Planning Services: CM Consult   Living arrangements for the past 2 months: Single Family Home                 DME Arranged: N/A DME Agency: NA       HH Arranged: NA HH Agency: NA        Prior Living Arrangements/Services Living arrangements for the past 2 months: Single Family Home Lives with:: Spouse Patient language and need for interpreter reviewed:: Yes Do you feel safe going back to the place where you live?: Yes      Need for Family Participation in Patient Care: Yes (Comment) Care giver support system in place?: Yes (comment) Current home services: DME (cane, walker, shower chair) Criminal Activity/Legal Involvement Pertinent to Current Situation/Hospitalization: No - Comment as needed  Activities of Daily Living   ADL Screening (condition at time of  admission) Independently performs ADLs?: No Does the patient have a NEW difficulty with bathing/dressing/toileting/self-feeding that is expected to last >3 days?: No Does the patient have a NEW difficulty with getting in/out of bed, walking, or climbing stairs that is expected to last >3 days?: No Does the patient have a NEW difficulty with communication that is expected to last >3 days?: No Is the patient deaf or have difficulty hearing?: No Does the patient have difficulty seeing, even when wearing glasses/contacts?: No Does the patient have difficulty concentrating, remembering, or making decisions?: Yes  Permission Sought/Granted Permission sought to share information with : Case Manager Permission granted to share information with : Yes, Verbal Permission Granted  Share Information with NAME: Case Manager     Permission granted to share info w Relationship: Gideon Burstein (spouse) [phone]     Emotional Assessment Appearance:: Appears stated age Attitude/Demeanor/Rapport: Unable to Assess Affect (typically observed): Unable to Assess Orientation: :  (unable to assess) Alcohol / Substance Use: Not Applicable Psych Involvement: No (comment)  Admission diagnosis:  Weakness [R53.1] Fall [W19.XXXA] Fall in home, initial encounter [W19.CHERENE, Y92.009] Patient Active Problem List   Diagnosis Date Noted   Major depression 06/30/2024   Pure hypercholesterolemia 06/30/2024   Dementia without behavioral disturbance (HCC) 06/30/2024   Fall 06/30/2024   ARF (acute renal failure) 01/31/2018   TIA (transient ischemic attack) 01/30/2018   Lower urinary tract infectious disease 12/24/2015   Acute encephalopathy 12/24/2015   Leukocytosis 12/24/2015  Urine retention 12/24/2015   Confusion 12/24/2015   PCP:  Charlott Dorn LABOR, MD Pharmacy:   [facility] [address] [place] - [address] [place] [address] Phone: [phone] Fax:  [phone]  [place] [facility] [address] [place] [address] Phone: [phone] Fax: [phone]     Social Drivers of Health (SDOH) Social History: SDOH Screenings   Food Insecurity: No Food Insecurity (07/01/2024)  Housing: Low Risk  (07/01/2024)  Transportation Needs: No Transportation Needs (07/01/2024)  Utilities: Not At Risk (07/01/2024)  Social Connections: Socially Integrated (07/01/2024)  Tobacco Use: Low Risk  (07/01/2024)   SDOH Interventions: Food Insecurity Interventions: Intervention Not Indicated, Inpatient TOC Housing Interventions: Intervention Not Indicated, Inpatient TOC Transportation Interventions: Intervention Not Indicated, Inpatient TOC Utilities Interventions: Intervention Not Indicated, Inpatient TOC   Readmission Risk Interventions     No data to display

## 2024-07-01 NOTE — Plan of Care (Signed)
   Problem: Clinical Measurements: Goal: Diagnostic test results will improve Outcome: Progressing

## 2024-07-02 DIAGNOSIS — R531 Weakness: Secondary | ICD-10-CM | POA: Diagnosis not present

## 2024-07-02 DIAGNOSIS — Z79899 Other long term (current) drug therapy: Secondary | ICD-10-CM | POA: Diagnosis not present

## 2024-07-02 DIAGNOSIS — I959 Hypotension, unspecified: Secondary | ICD-10-CM | POA: Diagnosis not present

## 2024-07-02 DIAGNOSIS — W19XXXA Unspecified fall, initial encounter: Secondary | ICD-10-CM | POA: Diagnosis present

## 2024-07-02 DIAGNOSIS — E7800 Pure hypercholesterolemia, unspecified: Secondary | ICD-10-CM | POA: Diagnosis not present

## 2024-07-02 DIAGNOSIS — F0280 Dementia in other diseases classified elsewhere without behavioral disturbance: Secondary | ICD-10-CM | POA: Diagnosis not present

## 2024-07-02 DIAGNOSIS — R4189 Other symptoms and signs involving cognitive functions and awareness: Secondary | ICD-10-CM | POA: Diagnosis not present

## 2024-07-02 DIAGNOSIS — Z7982 Long term (current) use of aspirin: Secondary | ICD-10-CM | POA: Diagnosis not present

## 2024-07-02 DIAGNOSIS — R7989 Other specified abnormal findings of blood chemistry: Secondary | ICD-10-CM | POA: Diagnosis not present

## 2024-07-02 DIAGNOSIS — R296 Repeated falls: Secondary | ICD-10-CM | POA: Diagnosis not present

## 2024-07-02 DIAGNOSIS — N390 Urinary tract infection, site not specified: Secondary | ICD-10-CM | POA: Diagnosis present

## 2024-07-02 DIAGNOSIS — G9341 Metabolic encephalopathy: Secondary | ICD-10-CM | POA: Diagnosis not present

## 2024-07-02 DIAGNOSIS — H409 Unspecified glaucoma: Secondary | ICD-10-CM | POA: Diagnosis not present

## 2024-07-02 DIAGNOSIS — W19XXXD Unspecified fall, subsequent encounter: Secondary | ICD-10-CM | POA: Diagnosis not present

## 2024-07-02 DIAGNOSIS — F329 Major depressive disorder, single episode, unspecified: Secondary | ICD-10-CM | POA: Diagnosis not present

## 2024-07-02 DIAGNOSIS — M545 Low back pain, unspecified: Secondary | ICD-10-CM | POA: Diagnosis not present

## 2024-07-02 DIAGNOSIS — N3941 Urge incontinence: Secondary | ICD-10-CM | POA: Diagnosis not present

## 2024-07-02 DIAGNOSIS — R339 Retention of urine, unspecified: Secondary | ICD-10-CM | POA: Diagnosis not present

## 2024-07-02 DIAGNOSIS — G309 Alzheimer's disease, unspecified: Secondary | ICD-10-CM | POA: Diagnosis not present

## 2024-07-02 DIAGNOSIS — G8929 Other chronic pain: Secondary | ICD-10-CM | POA: Diagnosis not present

## 2024-07-02 DIAGNOSIS — R251 Tremor, unspecified: Secondary | ICD-10-CM | POA: Diagnosis not present

## 2024-07-02 DIAGNOSIS — N3289 Other specified disorders of bladder: Secondary | ICD-10-CM | POA: Diagnosis not present

## 2024-07-02 DIAGNOSIS — R112 Nausea with vomiting, unspecified: Secondary | ICD-10-CM | POA: Diagnosis not present

## 2024-07-02 DIAGNOSIS — Z66 Do not resuscitate: Secondary | ICD-10-CM | POA: Diagnosis not present

## 2024-07-02 DIAGNOSIS — Z743 Need for continuous supervision: Secondary | ICD-10-CM | POA: Diagnosis not present

## 2024-07-02 DIAGNOSIS — D539 Nutritional anemia, unspecified: Secondary | ICD-10-CM | POA: Diagnosis not present

## 2024-07-02 DIAGNOSIS — Z87440 Personal history of urinary (tract) infections: Secondary | ICD-10-CM | POA: Diagnosis not present

## 2024-07-02 DIAGNOSIS — I951 Orthostatic hypotension: Secondary | ICD-10-CM | POA: Diagnosis not present

## 2024-07-02 DIAGNOSIS — Z818 Family history of other mental and behavioral disorders: Secondary | ICD-10-CM | POA: Diagnosis not present

## 2024-07-02 DIAGNOSIS — F0393 Unspecified dementia, unspecified severity, with mood disturbance: Secondary | ICD-10-CM | POA: Diagnosis not present

## 2024-07-02 DIAGNOSIS — Z9181 History of falling: Secondary | ICD-10-CM | POA: Diagnosis not present

## 2024-07-02 DIAGNOSIS — D696 Thrombocytopenia, unspecified: Secondary | ICD-10-CM | POA: Diagnosis not present

## 2024-07-02 DIAGNOSIS — I5A Non-ischemic myocardial injury (non-traumatic): Secondary | ICD-10-CM | POA: Diagnosis not present

## 2024-07-02 DIAGNOSIS — R310 Gross hematuria: Secondary | ICD-10-CM | POA: Diagnosis not present

## 2024-07-02 DIAGNOSIS — F0390 Unspecified dementia without behavioral disturbance: Secondary | ICD-10-CM | POA: Diagnosis not present

## 2024-07-02 DIAGNOSIS — N401 Enlarged prostate with lower urinary tract symptoms: Secondary | ICD-10-CM | POA: Diagnosis not present

## 2024-07-02 DIAGNOSIS — N189 Chronic kidney disease, unspecified: Secondary | ICD-10-CM | POA: Diagnosis not present

## 2024-07-02 DIAGNOSIS — R2681 Unsteadiness on feet: Secondary | ICD-10-CM | POA: Diagnosis not present

## 2024-07-02 DIAGNOSIS — Z885 Allergy status to narcotic agent status: Secondary | ICD-10-CM | POA: Diagnosis not present

## 2024-07-02 DIAGNOSIS — G9340 Encephalopathy, unspecified: Secondary | ICD-10-CM | POA: Diagnosis not present

## 2024-07-02 LAB — BASIC METABOLIC PANEL WITH GFR
Anion gap: 11 (ref 5–15)
Anion gap: 9 (ref 5–15)
BUN: 15 mg/dL (ref 8–23)
BUN: 16 mg/dL (ref 8–23)
CO2: 26 mmol/L (ref 22–32)
CO2: 26 mmol/L (ref 22–32)
Calcium: 9.2 mg/dL (ref 8.9–10.3)
Calcium: 9 mg/dL (ref 8.9–10.3)
Chloride: 102 mmol/L (ref 98–111)
Chloride: 101 mmol/L (ref 98–111)
Creatinine, Ser: 0.81 mg/dL (ref 0.61–1.24)
Creatinine, Ser: 0.97 mg/dL (ref 0.61–1.24)
GFR, Estimated: >60 mL/min (ref >60)
GFR, Estimated: >60 mL/min (ref >60)
Glucose, Bld: 108 mg/dL — ABNORMAL HIGH (ref 70–99)
Glucose, Bld: 95 mg/dL (ref 70–99)
Potassium: 3.5 mmol/L (ref 3.5–5.1)
Potassium: 3.5 mmol/L (ref 3.5–5.1)
Sodium: 138 mmol/L (ref 135–145)
Sodium: 136 mmol/L (ref 135–145)

## 2024-07-02 LAB — CBC
HCT: 37.3 % — ABNORMAL LOW (ref 39.0–52.0)
HCT: 37.9 % — ABNORMAL LOW (ref 39.0–52.0)
Hemoglobin: 11.8 g/dL — ABNORMAL LOW (ref 13.0–17.0)
Hemoglobin: 12.1 g/dL — ABNORMAL LOW (ref 13.0–17.0)
MCH: 32.1 pg (ref 26.0–34.0)
MCH: 32.7 pg (ref 26.0–34.0)
MCHC: 31.6 g/dL (ref 30.0–36.0)
MCHC: 31.9 g/dL (ref 30.0–36.0)
MCV: 101.4 fL — ABNORMAL HIGH (ref 80.0–100.0)
MCV: 102.4 fL — ABNORMAL HIGH (ref 80.0–100.0)
Platelets: 131 K/uL — ABNORMAL LOW (ref 150–400)
Platelets: 126 K/uL — ABNORMAL LOW (ref 150–400)
RBC: 3.68 MIL/uL — ABNORMAL LOW (ref 4.22–5.81)
RBC: 3.7 MIL/uL — ABNORMAL LOW (ref 4.22–5.81)
RDW: 12.8 % (ref 11.5–15.5)
RDW: 12.8 % (ref 11.5–15.5)
WBC: 9.1 K/uL (ref 4.0–10.5)
WBC: 10 K/uL (ref 4.0–10.5)
nRBC: 0 % (ref 0.0–0.2)
nRBC: 0 % (ref 0.0–0.2)

## 2024-07-02 LAB — TROPONIN T, HIGH SENSITIVITY
Troponin T High Sensitivity: 25 ng/L — ABNORMAL HIGH (ref 0–19)
Troponin T High Sensitivity: 23 ng/L — ABNORMAL HIGH (ref 0–19)

## 2024-07-02 LAB — LACTIC ACID, PLASMA: Lactic Acid, Venous: 2 mmol/L (ref 0.5–1.9)

## 2024-07-02 LAB — GLUCOSE, CAPILLARY: Glucose-Capillary: 119 mg/dL — ABNORMAL HIGH (ref 70–99)

## 2024-07-02 LAB — AMMONIA: Ammonia: 41 umol/L — ABNORMAL HIGH (ref 9–35)

## 2024-07-02 MED ORDER — LACTATED RINGERS IV SOLN: INTRAVENOUS | Status: AC

## 2024-07-02 MED ORDER — QUETIAPINE FUMARATE 25 MG PO TABS: 12.5000 mg | ORAL_TABLET | Freq: Every day | ORAL | Status: DC

## 2024-07-02 MED ORDER — QUETIAPINE FUMARATE 25 MG PO TABS
12.5000 mg | ORAL_TABLET | Freq: Every evening | ORAL | Status: DC | PRN
  Filled 2024-07-02: qty 1

## 2024-07-02 MED ORDER — SODIUM CHLORIDE 0.9 % IV BOLUS
500.0000 mL | Freq: Once | INTRAVENOUS | Status: AC
  Administered 2024-07-02: 500 mL via INTRAVENOUS

## 2024-07-02 MED ORDER — SODIUM CHLORIDE 0.9 % IV BOLUS: 500.0000 mL | Freq: Once | INTRAVENOUS | Status: AC

## 2024-07-02 NOTE — Evaluation (Signed)
 Occupational Therapy Evaluation Patient Details Name: Chad Holden MRN: [id number] DOB: 07-20-1934 Today's Date: 07/02/2024   History of Present Illness   89 yo male admitted with weakness, possible UTI, inablity to ambulate after sustaining fall at home. Hx of TIA, falls, dementia, CKD, depression, glaucoma     Clinical Impressions PTA, patient lives at home with wife who provides some assist for BADL's using tub transfer bench for bathing and RW for ambulation in home. Currently, patient presents with deficits outlined below (see OT Problem List for details) most significantly decreased cognition, self feeding with pocketing food noted, balance, activity tolerance and generalized muscle strength with motor planning deficits impacting BADL's (max-total LB) and functional mobility (mod A +2) performance. Recommending ST consult as OT noted food collection when cleansing dentures and wife requiring guidance to ensure aspiration prevention. Patient will benefit from continued inpatient follow up therapy, <3 hours/day. Patient requires continued Acute care hospital level OT services to progress safety and functional performance and allow for discharge.       If plan is discharge home, recommend the following:   Two people to help with walking and/or transfers;A lot of help with bathing/dressing/bathroom;Assistance with cooking/housework;Assistance with feeding;Direct supervision/assist for medications management;Direct supervision/assist for financial management;Assist for transportation;Help with stairs or ramp for entrance;Supervision due to cognitive status     Functional Status Assessment   Patient has had a recent decline in their functional status and demonstrates the ability to make significant improvements in function in a reasonable and predictable amount of time.     Equipment Recommendations   None recommended by OT     Recommendations for Other Services   Speech  consult     Precautions/Restrictions   Precautions Precautions: Fall Precaution/Restrictions Comments: hypotension Restrictions Weight Bearing Restrictions Per Provider Order: No     Mobility Bed Mobility Overal bed mobility: Needs Assistance Bed Mobility: Supine to Sit     Supine to sit: Mod assist, HOB elevated, Used rails     General bed mobility comments: Slow processing with all motor commands with multimodal cueing required due to processing delays    Transfers Overall transfer level: Needs assistance Equipment used: Rolling walker (2 wheels) Transfers: Sit to/from Stand, Bed to chair/wheelchair/BSC Sit to Stand: Mod assist, +2 physical assistance, +2 safety/equipment     Step pivot transfers: Mod assist, +2 physical assistance, +2 safety/equipment     General transfer comment: Assist to power up, max facfilitation for upright trunk, significant flexed hips after 4 STSs and bowel hygiene, repeated multimodal cueing required. Used RW. Fall risk.      Balance Overall balance assessment: Needs assistance, History of Falls Sitting-balance support: Feet supported Sitting balance-Leahy Scale: Fair Sitting balance - Comments: forward head and kyphotic   Standing balance support: Bilateral upper extremity supported, During functional activity, Reliant on assistive device for balance Standing balance-Leahy Scale: Poor Standing balance comment: max multimodal cues for upright trunk with flexed posture                           ADL either performed or assessed with clinical judgement   ADL Overall ADL's : Needs assistance/impaired Eating/Feeding: Minimal assistance;Sitting;Cueing for sequencing Eating/Feeding Details (indicate cue type and reason): pocketing food with residual left in mouth from last pm Grooming: Wash/dry hands;Wash/dry face;Oral care;Moderate assistance;Sitting   Upper Body Bathing: Moderate assistance;Sitting;Cueing for sequencing    Lower Body Bathing: Total assistance;Sit to/from stand   Upper Body Dressing : Maximal  assistance;Sitting;Cueing for sequencing   Lower Body Dressing: Total assistance   Toilet Transfer: Moderate assistance;BSC/3in1;+2 for physical assistance;+2 for safety/equipment;Cueing for sequencing   Toileting- Clothing Manipulation and Hygiene: Total assistance;Sit to/from stand       Functional mobility during ADLs: Moderate assistance;Rollator (4 wheels);+2 for physical assistance;+2 for safety/equipment General ADL Comments: max cues for processing     Vision Baseline Vision/History: 0 No visual deficits          Praxis Praxis: Impaired Praxis Impairment Details: Motor planning, Organization     Pertinent Vitals/Pain Pain Assessment Pain Assessment: Faces Faces Pain Scale: No hurt Breathing: normal Negative Vocalization: none Facial Expression: smiling or inexpressive Body Language: relaxed Consolability: no need to console PAINAD Score: 0     Extremity/Trunk Assessment Upper Extremity Assessment Upper Extremity Assessment: Generalized weakness;Right hand dominant   Lower Extremity Assessment Lower Extremity Assessment: Defer to PT evaluation   Cervical / Trunk Assessment Cervical / Trunk Assessment: Kyphotic   Communication Communication Communication: No apparent difficulties   Cognition Arousal: Alert Behavior During Therapy: Flat affect Cognition: Cognition impaired   Orientation impairments: Time, Situation, Place Awareness: Intellectual awareness impaired, Online awareness impaired Memory impairment (select all impairments): Short-term memory Attention impairment (select first level of impairment): Focused attention Executive functioning impairment (select all impairments): Initiation, Organization, Sequencing, Reasoning, Problem solving OT - Cognition Comments: ectremely below baseline with slow processing                 Following commands:  Impaired Following commands impaired: Follows one step commands with increased time, Follows one step commands inconsistently     Cueing  General Comments   Cueing Techniques: Verbal cues;Tactile cues;Visual cues  redness in sacral region, limited activity tolerance for mobility and standing, reported dizziness initially, BP taken and stable           Home Living Family/patient expects to be discharged to:: Private residence Living Arrangements: Spouse/significant other Available Help at Discharge: Family;Available 24 hours/day Type of Home: House Home Access: Stairs to enter [employer] of Steps: 2 Entrance Stairs-Rails: Right Home Layout: One level     Bathroom Shower/Tub: Tub/shower unit;Curtain   Firefighter: Standard     Home Equipment: Agricultural consultant (2 wheels);Cane - single point;Tub bench;Hand held shower head          Prior Functioning/Environment Prior Level of Function : Needs assist             Mobility Comments: uses RW when first gets up then can usually slowly walk around the house ADLs Comments: wife assists with feeding, bathing, dressing    OT Problem List: Decreased strength;Decreased activity tolerance;Impaired balance (sitting and/or standing);Decreased coordination;Decreased cognition;Decreased safety awareness;Decreased knowledge of use of DME or AE;Decreased knowledge of precautions;Cardiopulmonary status limiting activity;Impaired UE functional use   OT Treatment/Interventions: Self-care/ADL training;Therapeutic exercise;Neuromuscular education;Energy conservation;DME and/or AE instruction;Therapeutic activities;Cognitive remediation/compensation;Patient/family education;Balance training      OT Goals(Current goals can be found in the care plan section)   Acute Rehab OT Goals Patient Stated Goal: to get stronger OT Goal Formulation: With patient/family Time For Goal Achievement: 07/16/24 Potential to Achieve Goals:  Fair ADL Goals Pt Will Perform Eating: with set-up;sitting Pt Will Perform Grooming: with contact guard assist;sitting Pt Will Perform Upper Body Bathing: with supervision;sitting Pt Will Perform Upper Body Dressing: with contact guard assist;sitting Pt Will Transfer to Toilet: with min assist;bedside commode;stand pivot transfer   OT Frequency:  Min 2X/week       AM-PAC OT 6 Clicks  Daily Activity     Outcome Measure Help from another person eating meals?: A Lot Help from another person taking care of personal grooming?: A Lot Help from another person toileting, which includes using toliet, bedpan, or urinal?: Total Help from another person bathing (including washing, rinsing, drying)?: A Lot Help from another person to put on and taking off regular upper body clothing?: A Lot Help from another person to put on and taking off regular lower body clothing?: Total 6 Click Score: 10   End of Session Equipment Utilized During Treatment: Gait belt;Rolling walker (2 wheels) Nurse Communication: Mobility status;Other (comment) (BM inputted in flowsheets and pocketing food)  Activity Tolerance: Patient limited by fatigue Patient left: in chair;with call bell/phone within reach;with chair alarm set;with nursing/sitter in room;with family/visitor present  OT Visit Diagnosis: Unsteadiness on feet (R26.81);Muscle weakness (generalized) (M62.81);History of falling (Z91.81);Cognitive communication deficit (R41.841)                Time: 9089-9049 OT Time Calculation (min): 40 min Charges:  OT General Charges $OT Visit: 1 Visit OT Evaluation $OT Eval Low Complexity: 1 Low OT Treatments $Self Care/Home Management : 8-22 mins  Jakie Debow OT/L Acute Rehabilitation Department  [phone]  07/02/2024, 10:02 AM

## 2024-07-02 NOTE — TOC Progression Note (Signed)
 Transition of Care [facility]) - Progression Note    Patient Details  Name: Chad Holden MRN: [id number] Date of Birth: 05-Nov-1933  Transition of [facility]) CM/SW Contact  Heather DELENA Saltness, LCSW Phone Number: 07/02/2024, 3:14 PM  Clinical Narrative:    Pt alert and oriented x1. CSW attempted to speak with pt's wife, [name] [phone], via phone call to discuss PT's recommendation for SNF rehab. No answer, voicemail left requesting return phone call. Per PT's note, pt's wife is not agreeable to SNF rehab at this time. TOC will continue to follow.   Expected Discharge Plan: Home/Self Care Barriers to Discharge: Continued Medical Work up   Expected Discharge Plan and Services   Discharge Planning Services: CM Consult   Living arrangements for the past 2 months: Single Family Home                 DME Arranged: N/A DME Agency: NA       HH Arranged: NA HH Agency: NA         Social Drivers of Health (SDOH) Interventions SDOH Screenings   Food Insecurity: No Food Insecurity (07/01/2024)  Housing: Low Risk  (07/01/2024)  Transportation Needs: No Transportation Needs (07/01/2024)  Utilities: Not At Risk (07/01/2024)  Social Connections: Socially Integrated (07/01/2024)  Tobacco Use: Low Risk  (07/01/2024)    Readmission Risk Interventions     No data to display          Signed: Heather Saltness, MSW, LCSW Clinical Social Worker Inpatient Care Management 07/02/2024 3:16 PM

## 2024-07-02 NOTE — Evaluation (Signed)
 Clinical/Bedside Swallow Evaluation Patient Details  Name: Chad Holden MRN: [id number] Date of Birth: [birth year]  Today's Date: 07/02/2024 Time: SLP Start Time (ACUTE ONLY): 1109 SLP Stop Time (ACUTE ONLY): 1123 SLP Time Calculation (min) (ACUTE ONLY): 14 min  Past Medical History:  Past Medical History:  Diagnosis Date   Dementia (HCC)    Depression    Glaucoma    Renal disorder    Past Surgical History: History reviewed. No pertinent surgical history. HPI:  Patient is an 89 y.o. male with medical history significant for dementia, depression, chronic kidney disease, glaucoma, who was brought in by family after a fall at home. The family with the patient did not know exactly what happened. Patient was admitted on 06/30/24 for observation and possible workup of UTI. Patient's wife Chad Holden is at bedside with patient. RN communicated that she's noticed patient pocketing foods and meds. Bedside swallow evaluation ordered to assess patient's swallow.    Assessment / Plan / Recommendation  Clinical Impression  Patient was in chair and wife, Chad Holden, was at bedside feeding patient breakfast upon SLP arrival. RN communicated with SLP about observations of patient pocketing his medicine and food. Patient's wife shared that she notices particles in his mouth in the evening when brushing his teeth, but no large pieces of food. She shared having him drink liquids during meals helps prevent this. The wife usually feeds the patient herself as he eats slower when feeding himself. Throughout the session, no coughing or overt s/s of aspiration were observed with cup sips of thin liquids and bites of puree and mechanical soft solids. The wife shared that the patient got choked up last week and she had to try performing the Heimlich Maneuver which got the patient coughing and helped. SLP educated wife on puree foods and explained how at this stage in the demetia progression, the patient might tolerate puree foods  better than solids. Wife was in agreement to try this before patient is discharged. SLP recommending DYS 1 (puree) with thin liquids. SLP will continue to follow for diet toleration and education. SLP Visit Diagnosis: Dysphagia, unspecified (R13.10)    Aspiration Risk       Diet Recommendation Dysphagia 1 (Puree);Thin liquid    Liquid Administration via: Spoon;Cup;Straw Medication Administration: Crushed with puree Supervision: Full supervision/cueing for compensatory strategies;Staff to assist with self feeding Compensations: Minimize environmental distractions;Small sips/bites;Slow rate;Lingual sweep for clearance of pocketing Postural Changes: Seated upright at 90 degrees    Other  Recommendations Oral Care Recommendations: Oral care BID     Assistance Recommended at Discharge    Functional Status Assessment Patient has had a recent decline in their functional status and demonstrates the ability to make significant improvements in function in a reasonable and predictable amount of time.  Frequency and Duration min 2x/week  1 week       Prognosis Prognosis for improved oropharyngeal function: Fair Barriers to Reach Goals: Cognitive deficits;Severity of deficits      Swallow Study   General Date of Onset: 07/02/24 HPI: Patient is an 89 y.o. male with medical history significant for dementia, depression, chronic kidney disease, glaucoma, who was brought in by family after a fall at home. The family with the patient did not know exactly what happened. Patient was admitted on 06/30/24 for observation and possible workup of UTI. Patient's wife Chad Holden is at bedside with patient. RN communicated that she's noticed patient pocketing foods and meds. Bedside swallow evaluation ordered to assess patient's swallow. Type of Study:  Bedside Swallow Evaluation Diet Prior to this Study: Regular;Thin liquids (Level 0) Temperature Spikes Noted: No Respiratory Status: Room air History of Recent  Intubation: No Behavior/Cognition: Alert;Pleasant mood;Requires cueing Oral Cavity Assessment: Within Functional Limits Oral Care Completed by SLP: No Oral Cavity - Dentition: Other (Comment) (partial on upper) Vision: Functional for self-feeding Self-Feeding Abilities: Able to feed self Patient Positioning: Upright in chair    Oral/Motor/Sensory Function Overall Oral Motor/Sensory Function: Within functional limits   Ice Chips     Thin Liquid Thin Liquid: Within functional limits Presentation: Cup    Nectar Thick     Honey Thick     Puree     Solid     Solid: Within functional limits Presentation: Spoon     Damien Hy  Graduate SLP Clinican

## 2024-07-02 NOTE — Progress Notes (Signed)
 Physical Therapy Treatment Patient Details Name: Chad Holden MRN: [id number] DOB: 1933/10/19 Today's Date: 07/02/2024   History of Present Illness 89 yo male admitted with weakness, possible UTI, inablity to ambulate after sustaining fall at home. Hx of TIA, falls, dementia, CKD, depression, glaucoma    PT Comments  Pt agreeable to working with therapy. Wife present during session (was present on yesterday as well). He continues to require +2 assist for safe mobility. Bowel incontinence during session-total assist for hygiene. He remains at risk for falls. Unable to safely ambulate on today. BP okay during session-138/91. Feel pt may need SNF stay but wife is not currently agreeable to placement. If she chooses to take him home, recommend maximized home health services.     If plan is discharge home, recommend the following: Two people to help with walking and/or transfers;A lot of help with bathing/dressing/bathroom;Assistance with cooking/housework;Assist for transportation;Help with stairs or ramp for entrance   Can travel by private vehicle        Equipment Recommendations  None recommended by PT    Recommendations for Other Services       Precautions / Restrictions Precautions Precautions: Fall Restrictions Weight Bearing Restrictions Per Provider Order: No     Mobility  Bed Mobility Overal bed mobility: Needs Assistance Bed Mobility: Supine to Sit     Supine to sit: Mod assist, +2 for physical assistance, +2 for safety/equipment, HOB elevated, Used rails     General bed mobility comments: Slow processing with all motor commands with multimodal cueing required due to processing delays. Assist for trunk and bil LEs.    Transfers Overall transfer level: Needs assistance Equipment used: Rolling walker (2 wheels) Transfers: Sit to/from Stand, Bed to chair/wheelchair/BSC Sit to Stand: Mod assist, +2 physical assistance, +2 safety/equipment   Step pivot transfers: Mod  assist, +2 physical assistance, +2 safety/equipment       General transfer comment: Assist to power up, max facilitation for upright trunk, significant flexed hips after 4 STSs and bowel hygiene-pt incontinent, repeated multimodal cueing required. Used RW. Fall risk.    Ambulation/Gait               General Gait Details: NT-high fall risk-barely able to step over to recliner on today   Stairs             Wheelchair Mobility     Tilt Bed    Modified Rankin (Stroke Patients Only)       Balance Overall balance assessment: Needs assistance, History of Falls         Standing balance support: Bilateral upper extremity supported, During functional activity, Reliant on assistive device for balance Standing balance-Leahy Scale: Poor                              Communication Communication Communication: No apparent difficulties  Cognition Arousal: Alert Behavior During Therapy: Flat affect   PT - Cognitive impairments: History of cognitive impairments                         Following commands: Impaired      Cueing Cueing Techniques: Verbal cues, Tactile cues, Visual cues  Exercises      General Comments General comments (skin integrity, edema, etc.): redness in sacral region, limited activity tolerance for mobility and standing, reported dizziness initially, BP taken and stable      Pertinent Vitals/Pain Pain Assessment Pain  Assessment: Faces Faces Pain Scale: Hurts little more Pain Location: back Pain Intervention(s): Limited activity within patient's tolerance, Monitored during session, Repositioned    Home Living Family/patient expects to be discharged to:: Private residence Living Arrangements: Spouse/significant other Available Help at Discharge: Family;Available 24 hours/day Type of Home: House Home Access: Stairs to enter Entrance Stairs-Rails: Right Entrance Stairs-Number of Steps: 2   Home Layout: One level Home  Equipment: Agricultural consultant (2 wheels);Cane - single point;Tub bench;Hand held shower head      Prior Function            PT Goals (current goals can now be found in the care plan section) Progress towards PT goals: Progressing toward goals    Frequency    Min 3X/week      PT Plan      Co-evaluation              AM-PAC PT 6 Clicks Mobility   Outcome Measure  Help needed turning from your back to your side while in a flat bed without using bedrails?: A Lot Help needed moving from lying on your back to sitting on the side of a flat bed without using bedrails?: A Lot Help needed moving to and from a bed to a chair (including a wheelchair)?: A Lot Help needed standing up from a chair using your arms (e.g., wheelchair or bedside chair)?: A Lot Help needed to walk in hospital room?: Total Help needed climbing 3-5 steps with a railing? : Total 6 Click Score: 10    End of Session Equipment Utilized During Treatment: Gait belt Activity Tolerance: Patient tolerated treatment well Patient left: in chair;with call bell/phone within reach;with chair alarm set;with family/visitor present   PT Visit Diagnosis: Muscle weakness (generalized) (M62.81);History of falling (Z91.81);Difficulty in walking, not elsewhere classified (R26.2);Unsteadiness on feet (R26.81)     Time: 9089-9063 PT Time Calculation (min) (ACUTE ONLY): 26 min  Charges:    $Therapeutic Activity: 8-22 mins PT General Charges $$ ACUTE PT VISIT: 1 Visit                         Dannial SQUIBB, PT Acute Rehabilitation  Office: [phone]

## 2024-07-02 NOTE — Plan of Care (Signed)
   Problem: Nutrition: Goal: Adequate nutrition will be maintained Outcome: Progressing   Problem: Coping: Goal: Level of anxiety will decrease Outcome: Progressing   Problem: Safety: Goal: Ability to remain free from injury will improve Outcome: Progressing

## 2024-07-02 NOTE — Progress Notes (Signed)
 Progress Note   Patient: Chad Holden FMW:[id number] DOB: 07/18/1934 DOA: 06/30/2024     0 DOS: the patient was seen and examined on 07/02/2024   Brief hospital course: HPI: Chad Holden is a 89 y.o. male with medical history significant of dementia, depression, chronic kidney disease, glaucoma, who was brought in by family after a fall at home.  He normally walks without a cane or walker.  Patient reports weakness.  Patient said he passed out and fell but did not trip or fell.  The family with the patient did not know exactly what happened.  No pain.  He is weak now unable to walk.  They think he may have UTI.  Urinalysis not very convincing however patient seems to have been weaker than usual.  Patient is being admitted for observation and possible workup of UTI.    Assessment and Plan: S/p Fall  Unclear etiology possibly mechanical. Versus orthosatic.  -Will discuss with family if okay to hold aricept   Will consult PT/OT   Possible UTI Continue antibiotics  Hypotension Patient developed hypotension this afternoon. He was fluid responsive. He is unable to reliably state if he is having any symptoms. He has no O2 requirement. He is being treated for UA. He has no fever and no leukocytosis. He has mild bleeding in his urine but otherwise no bleeding. His hgb was stable. He has no history of CAD. His cardiac enzymes were mildly elevated and trended flat.  No evidence of acute heart failure.  Is possibly iatrogenic in the setting of Seroquel.  -Continue IV fluids  -Will hold further doses of Seroquel -Will discuss with patients family if okay to hold home dementia medicaiotns    Non ischemic myocardial injury  Patient unable to state if symptoms.   Dementia Appears at baseline Will discuss with family if okay with stopping aricept    H/o urinary retention Having some blood tinged urine and urine around foley catheter, reportedly due to bladder spasms.  -continue foley   -continue flomax  and home mybetriq  -Will need outpatient TOV    Major depression  Continue home medications   Macrocytic anemia  Thrombocytopenia Was noted to have drop in hgb from ~15 to 11s. Suspect his baseline is closer to !1-12. Possibly hemoconcentrated. Previously HDS until this PM. Hgb Stable on repeat this PM.   -Will check b12, folate, and iron  -Continue to monitor platelets no need to transfuse       Subjective: Patient developed hypotension this PM. Reportedly had some twitching. He responded to a bolus. Patient did receive seroquel ON, this is a medication that he is not normally on.   Physical Exam: Vitals:   07/02/24 1431 07/02/24 1528 07/02/24 1533 07/02/24 1646  BP: 100/70 106/64 104/74 102/71  Pulse: 90 95 98 90  Resp:  16 18 16   Temp:   98.4 F (36.9 C) 98.5 F (36.9 C)  TempSrc:   Oral Oral  SpO2: 99% 99% 99% 100%  Weight:      Height:       Physical Exam  Constitutional: In no distress.  Cardiovascular: Normal rate, regular rhythm. No lower extremity edema  Pulmonary: Non labored breathing on room air, no wheezing or rales.   Abdominal: Soft. Non distended and non tender Musculoskeletal: Normal range of motion.     Neurological: Alert and oriented to person only.  GU: foley catheter in place  Skin: Skin is warm and dry.    Data Reviewed:  Latest Ref Rng & Units 07/02/2024    2:17 PM 07/02/2024    8:10 AM 07/01/2024    3:15 AM  BMP  Glucose 70 - 99 mg/dL 95  891  846   BUN 8 - 23 mg/dL 16  15  18    Creatinine 0.61 - 1.24 mg/dL 9.02  9.18  9.12   Sodium 135 - 145 mmol/L 136  138  137   Potassium 3.5 - 5.1 mmol/L 3.5  3.5  4.1   Chloride 98 - 111 mmol/L 101  102  101   CO2 22 - 32 mmol/L 26  26  28    Calcium  8.9 - 10.3 mg/dL 9.0  9.2  9.3       Latest Ref Rng & Units 07/02/2024    2:17 PM 07/02/2024    8:10 AM 07/01/2024   12:15 PM  CBC  WBC 4.0 - 10.5 K/uL 10.0  9.1  8.7   Hemoglobin 13.0 - 17.0 g/dL 87.8  88.1  87.2    Hematocrit 39.0 - 52.0 % 37.9  37.3  42.2   Platelets 150 - 400 K/uL 126  131  149      Family Communication: spouse  Disposition: Status is: Observation The patient remains OBS appropriate and will d/c before 2 midnights.  Planned Discharge Destination: pending PT/OT eval    Time spent: 35 minutes  Author: Alban Pepper, MD 07/02/2024 6:12 PM  For on call review [email or web address].

## 2024-07-02 NOTE — Progress Notes (Signed)
 Subjective: First time meeting Chad Holden.  He was resting in bed and nonverbal.  He was accompanied by his wife with whom I reviewed his history, case, and plan.  Significant spasm overnight. Foley catheter draining clear yellow urine.  Objective: Vital signs in last 24 hours: Temp:  [97.7 F (36.5 C)-100.2 F (37.9 C)] 98.3 F (36.8 C) (10/20 0454) Pulse Rate:  [93-109] 93 (10/20 0454) Resp:  [16-18] 17 (10/20 0454) BP: (108-142)/(58-83) 122/70 (10/20 0454) SpO2:  [96 %-99 %] 96 % (10/20 0454) Weight:  [93 kg] 93 kg (10/19 1100)  Assessment/Plan: # Urgent continence # Urinary retention  Foley catheter placed with Dr. [name] on 07/01/2024 with report of 500 mL in bladder.  Some of this was likely discharged by spasm prior to her arrival, as the bed was notably wet and that volume did not return.  Present plan to remove Foley catheter in 3 to 5 days.  Out of concern that the patient may not have the sensation of urgency +/- be unable to communicate his discomfort due to dementia, we will plan for the following: Have the patient's wife bring a few of his incontinence briefs from home, bladder scan to establish baseline, remove Foley and scan him every few hours to be sure he is voiding.   Regarding his falls and medication: Orthostatic hypotension recorded by PT.  This is right on the threshold of being clinically significant @ .  Reviewed with Dr. [name] and based on the bladder trabeculation visualized on imaging, would suggest leaving on tamsulosin  for the time being.  He has been on this for many years and has historically tolerated well.  Could always trial an alternative alpha-blocker on an outpatient basis if no other culprits are identified.   Intake/Output from previous day: 10/19 0701 - 10/20 0700 In: 1873.8 [P.O.:480; I.V.:1393.8] Out: 1300 [Urine:1300]  Intake/Output this shift: Total I/O In: 161.4 [I.V.:61.4; IV Piggyback:100] Out: 400 [Urine:400]  Physical Exam:   General: non-verbal CV: No cyanosis Lungs: equal chest rise Gu: foley in place draining clear yellow urine  Lab Results: Recent Labs    07/01/24 0315 07/01/24 1215 07/02/24 0810  HGB 11.7* 12.7* 11.8*  HCT 35.9* 42.2 37.3*   BMET Recent Labs    06/30/24 1708 07/01/24 0315 07/01/24 1215 07/02/24 0810  NA 137 137  --   --   K 4.5 4.1  --   --   CL 97* 101  --   --   CO2 28 28  --   --   GLUCOSE 109* 153*  --   --   BUN 21 18  --   --   CREATININE 0.93 0.87  --   --   CALCIUM  10.3 9.3  --   --   HGB 15.4 11.7* 12.7* 11.8*  WBC 6.8 7.5 8.7 9.1     Studies/Results: CT Cervical Spine Wo Contrast Result Date: 06/30/2024 CLINICAL DATA:  Chad Holden, syncope, neck trauma EXAM: CT CERVICAL SPINE WITHOUT CONTRAST TECHNIQUE: Multidetector CT imaging of the cervical spine was performed without intravenous contrast. Multiplanar CT image reconstructions were also generated. RADIATION DOSE REDUCTION: This exam was performed according to the departmental dose-optimization program which includes automated exposure control, adjustment of the mA and/or kV according to patient size and/or use of iterative reconstruction technique. COMPARISON:  None Available. FINDINGS: Alignment: Alignment is grossly anatomic. Skull base and vertebrae: No acute fracture. No primary bone lesion or focal pathologic process. Soft tissues and spinal canal: No  prevertebral fluid or swelling. No visible canal hematoma. Disc levels: Diffuse cervical spondylosis and facet hypertrophy. There is partial bony fusion at C2-3 and C4-5. Marked anterior osteophyte at C6-7 and C7-T1. Upper chest: Airway is patent.  Lung apices are clear. Other: Reconstructed images demonstrate no additional findings. IMPRESSION: 1. No acute cervical spine fracture. 2. Multilevel cervical degenerative changes. Electronically Signed   By: Ozell Daring M.D.   On: 06/30/2024 17:40   CT Head Wo Contrast Result Date: 06/30/2024 CLINICAL DATA:  Chad Holden,  weakness, syncope EXAM: CT HEAD WITHOUT CONTRAST TECHNIQUE: Contiguous axial images were obtained from the base of the skull through the vertex without intravenous contrast. RADIATION DOSE REDUCTION: This exam was performed according to the departmental dose-optimization program which includes automated exposure control, adjustment of the mA and/or kV according to patient size and/or use of iterative reconstruction technique. COMPARISON:  11/06/2021 FINDINGS: Brain: Stable diffuse cerebral atrophy with ex vacuo dilatation of the lateral ventricles, age appropriate. Chronic small vessel ischemic changes within the periventricular white matter. No acute infarct or hemorrhage. Remaining midline structures are unremarkable. No acute extra-axial fluid collections. No mass effect. Vascular: No hyperdense vessel or unexpected calcification. Skull: Normal. Negative for fracture or focal lesion. Sinuses/Orbits: No acute finding. Other: None. IMPRESSION: 1. Stable head CT, no acute intracranial process. Electronically Signed   By: Ozell Daring M.D.   On: 06/30/2024 17:38   DG Chest Portable 1 View Result Date: 06/30/2024 CLINICAL DATA:  Fall. EXAM: PORTABLE CHEST 1 VIEW COMPARISON:  Chest radiograph dated 11/06/2021. FINDINGS: The heart size and mediastinal contours are within normal limits. Both lungs are clear. The visualized skeletal structures are unremarkable. IMPRESSION: No active disease. Electronically Signed   By: Vanetta Chou M.D.   On: 06/30/2024 15:53      LOS: 0 days   Ole Bourdon, NP [facility] Pager: [phone]  07/02/2024, 9:09 AM

## 2024-07-03 DIAGNOSIS — I959 Hypotension, unspecified: Secondary | ICD-10-CM | POA: Diagnosis not present

## 2024-07-03 DIAGNOSIS — W19XXXD Unspecified fall, subsequent encounter: Secondary | ICD-10-CM | POA: Diagnosis not present

## 2024-07-03 LAB — BLOOD GAS, ARTERIAL
Bicarbonate: 27.8 mmol/L — AB (ref 20.0–28.0)
Drawn by: 213381
O2 Saturation: 98.5 % — AB (ref 0.0–2.0)
Patient temperature: 213381
Patient temperature: 98.5 %
pH, Arterial: 41 mmHg (ref 7.35–7.45)
pH, Arterial: 7.44 (ref 7.35–7.45)
pO2, Arterial: 98 mmHg (ref 83–28.0)
pO2, Arterial: 98 mmHg (ref 83–48)

## 2024-07-03 LAB — IRON AND TIBC
Iron: 53 ug/dL (ref 45–182)
Saturation Ratios: 28 % (ref 17.9–39.5)
TIBC: 186 ug/dL — ABNORMAL LOW (ref 250–450)
UIBC: 133 ug/dL

## 2024-07-03 LAB — BASIC METABOLIC PANEL WITH GFR
Anion gap: 10 (ref 5–15)
BUN: 14 mg/dL (ref 8–23)
CO2: 26 mmol/L (ref 22–32)
Calcium: 9.1 mg/dL (ref 8.9–10.3)
Chloride: 106 mmol/L (ref 98–111)
Creatinine, Ser: 0.79 mg/dL (ref 0.61–1.24)
GFR, Estimated: >60 mL/min (ref >60)
Glucose, Bld: 110 mg/dL — ABNORMAL HIGH (ref 70–99)
Potassium: 3.7 mmol/L (ref 3.5–5.1)
Sodium: 141 mmol/L (ref 135–145)

## 2024-07-03 LAB — FOLATE: Folate: 6.4 ng/mL (ref >5.9)

## 2024-07-03 LAB — LACTIC ACID, PLASMA
Lactic Acid, Venous: 1.6 mmol/L (ref 0.5–1.9)
Lactic Acid, Venous: 2 mmol/L (ref 0.5–1.9)

## 2024-07-03 LAB — CBC
HCT: 36.3 % — ABNORMAL LOW (ref 39.0–52.0)
Hemoglobin: 11.2 g/dL — ABNORMAL LOW (ref 13.0–17.0)
MCH: 31.5 pg (ref 26.0–34.0)
MCHC: 30.9 g/dL (ref 30.0–36.0)
MCV: 102 fL — ABNORMAL HIGH (ref 80.0–100.0)
Platelets: 146 K/uL — ABNORMAL LOW (ref 150–400)
RBC: 3.56 MIL/uL — ABNORMAL LOW (ref 4.22–5.81)
RDW: 12.9 % (ref 11.5–15.5)
WBC: 7.4 K/uL (ref 4.0–10.5)
nRBC: 0 % (ref 0.0–0.2)

## 2024-07-03 LAB — PRO BRAIN NATRIURETIC PEPTIDE: Pro Brain Natriuretic Peptide: 938 pg/mL — ABNORMAL HIGH (ref <300.0)

## 2024-07-03 LAB — VITAMIN B12: Vitamin B-12: 426 pg/mL (ref 180–914)

## 2024-07-03 LAB — TSH: TSH: 1.46 u[IU]/mL (ref 0.350–4.500)

## 2024-07-03 LAB — FERRITIN: Ferritin: 510 ng/mL — ABNORMAL HIGH (ref 24–336)

## 2024-07-03 MED ORDER — SODIUM CHLORIDE 0.9 % IV BOLUS
500.0000 mL | Freq: Once | INTRAVENOUS | Status: AC
Start: 2024-07-03 — End: 2024-07-03
  Administered 2024-07-03: 500 mL via INTRAVENOUS

## 2024-07-03 MED ORDER — VENLAFAXINE HCL ER 150 MG PO CP24
150.0000 mg | ORAL_CAPSULE | Freq: Every day | ORAL | Status: DC
  Administered 2024-07-04 – 2024-07-06 (×3): 150 mg via ORAL
  Filled 2024-07-03 (×3): qty 1

## 2024-07-03 MED ORDER — SODIUM CHLORIDE 0.9 % IV SOLN
1.0000 g | INTRAVENOUS | Status: AC
  Administered 2024-07-03: 1 g via INTRAVENOUS
  Filled 2024-07-03: qty 10

## 2024-07-03 MED ORDER — MEMANTINE HCL 10 MG PO TABS
10.0000 mg | ORAL_TABLET | Freq: Every day | ORAL | Status: DC
  Administered 2024-07-03 – 2024-07-05 (×3): 10 mg via ORAL
  Filled 2024-07-03 (×3): qty 1

## 2024-07-03 NOTE — Progress Notes (Signed)
     Subjective: Patient looks better this morning.  He is sitting up in bed and about to start breakfast with the assistance of OT and his wife.  No acute events overnight.  Objective: Vital signs in last 24 hours: Temp:  [97.7 F (36.5 C)-99.3 F (37.4 C)] 99.3 F (37.4 C) (10/21 0449) Pulse Rate:  [84-104] 92 (10/21 0451) Resp:  [16-19] 17 (10/21 0449) BP: (86-118)/(58-75) 112/75 (10/21 0451) SpO2:  [95 %-100 %] 95 % (10/21 0449)  Assessment/Plan: # Urgent continence # Urinary retention  Foley catheter placed with Dr. [name] on 07/01/2024 with report of 500 mL in bladder.  Some of this was likely discharged by spasm prior to her arrival, as the bed was notably wet and that volume did not return.  Present plan to remove Foley catheter in 3 to 5 days.  Out of concern that the patient may not have the sensation of urgency +/- be unable to communicate his discomfort due to dementia, we will plan for the following: Have the patient's wife bring a few of his incontinence briefs from home, bladder scan to establish baseline, remove Foley and scan him every few hours to be sure he is voiding.  Reviewed this plan with wife this morning and she is in agreement.  Once we have an idea when he may be able to discharge home, we will decide what day to attempt his voiding trial.  Considering the difficulty of placing the catheter there is no harm in leaving it there for maximal healing in the meantime.  Regarding his falls and medication: Orthostatic hypotension recorded by PT.  This is right on the threshold of being clinically significant @ .  Reviewed with Dr. [name] and based on the bladder trabeculation visualized on imaging, would suggest leaving on tamsulosin  for the time being.  He has been on this for many years and has historically tolerated well.  Could always trial an alternative alpha-blocker on an outpatient basis if no other culprits are identified.   Mild hematuria.  Hold Lovenox  in  favor of SCDs if medically reasonable.  Intake/Output from previous day: 10/20 0701 - 10/21 0700 In: 854.9 [P.O.:340; I.V.:414.9; IV Piggyback:100] Out: 2450 [Urine:2450]  Intake/Output this shift: No intake/output data recorded.  Physical Exam:  General: non-verbal CV: No cyanosis Lungs: equal chest rise Gu: foley in place draining lightly blood-tinged yellow urine.  Lab Results: Recent Labs    07/02/24 0810 07/02/24 1417 07/03/24 0442  HGB 11.8* 12.1* 11.2*  HCT 37.3* 37.9* 36.3*   BMET Recent Labs    07/02/24 1417 07/03/24 0442  NA 136 141  K 3.5 3.7  CL 101 106  CO2 26 26  GLUCOSE 95 110*  BUN 16 14  CREATININE 0.97 0.79  CALCIUM  9.0 9.1  HGB 12.1* 11.2*  WBC 10.0 7.4     Studies/Results: No results found.     LOS: 1 day   Ole Bourdon, NP [facility] Pager: [phone]  07/03/2024, 10:46 AM

## 2024-07-03 NOTE — Progress Notes (Signed)
 Physical Therapy Treatment Patient Details Name: Chad Holden MRN: [id number] DOB: 1934-01-07 Today's Date: 07/03/2024   History of Present Illness 89 yo male admitted with weakness, possible UTI, inablity to ambulate after sustaining fall at home. Hx of TIA, falls, dementia, CKD, depression, glaucoma    PT Comments  Pt/wife agreeable to therapy session. Pt more alert on today. Followed 1 step commands with increased time and repetition. Mod A +2 for safety for mobility. He ambulated ~15 feet with a RW. Bowel incontinence during session requiring total assist for hygiene. He tolerated activity well. Assisted pt into recliner to finish having his meal. Patient will benefit from continued inpatient follow up therapy, <3 hours/day     If plan is discharge home, recommend the following: A lot of help with bathing/dressing/bathroom;Assistance with cooking/housework;Assist for transportation;Help with stairs or ramp for entrance;A lot of help with walking and/or transfers   Can travel by private vehicle        Equipment Recommendations  None recommended by PT    Recommendations for Other Services       Precautions / Restrictions Precautions Precautions: Fall Precaution/Restrictions Comments: incontinent Restrictions Weight Bearing Restrictions Per Provider Order: No     Mobility  Bed Mobility Overal bed mobility: Needs Assistance Bed Mobility: Supine to Sit     Supine to sit: Mod assist, +2 for physical assistance, +2 for safety/equipment, HOB elevated, Used rails     General bed mobility comments: Slow processing with all motor commands with multimodal cueing required due to processing delays. Assist for trunk and bil LEs.    Transfers Overall transfer level: Needs assistance Equipment used: Rolling walker (2 wheels) Transfers: Sit to/from Stand Sit to Stand: +2 safety/equipment, Mod assist           General transfer comment: Assist to power up, facilitation  trunk/hips. Cues for safety, technique, hand placement. Increased time.    Ambulation/Gait Ambulation/Gait assistance: Min assist, +2 safety/equipment Gait Distance (Feet): 15 Feet Assistive device: Rolling walker (2 wheels) Gait Pattern/deviations: Step-through pattern, Decreased stride length, Trunk flexed       General Gait Details: Cues for safety, RW proximity, posture, foot clearance when stepping. Assist to stabilize pt and manage RW. Followed with recliner and transported back to bedside. Bowel incontinence while walking.   Stairs             Wheelchair Mobility     Tilt Bed    Modified Rankin (Stroke Patients Only)       Balance Overall balance assessment: Needs assistance, History of Falls         Standing balance support: Bilateral upper extremity supported, During functional activity, Reliant on assistive device for balance Standing balance-Leahy Scale: Poor                              Communication Communication Communication: No apparent difficulties  Cognition Arousal: Alert Behavior During Therapy: Flat affect   PT - Cognitive impairments: History of cognitive impairments                         Following commands: Impaired Following commands impaired: Follows one step commands with increased time, Follows one step commands inconsistently    Cueing Cueing Techniques: Verbal cues, Tactile cues, Visual cues  Exercises      General Comments        Pertinent Vitals/Pain Pain Assessment Pain Assessment: Faces Faces Pain Scale:  No hurt    Home Living                          Prior Function            PT Goals (current goals can now be found in the care plan section) Progress towards PT goals: Progressing toward goals    Frequency    Min 2X/week      PT Plan      Co-evaluation              AM-PAC PT 6 Clicks Mobility   Outcome Measure  Help needed turning from your back to  your side while in a flat bed without using bedrails?: A Lot Help needed moving from lying on your back to sitting on the side of a flat bed without using bedrails?: A Lot Help needed moving to and from a bed to a chair (including a wheelchair)?: A Lot Help needed standing up from a chair using your arms (e.g., wheelchair or bedside chair)?: A Lot Help needed to walk in hospital room?: A Lot Help needed climbing 3-5 steps with a railing? : Total 6 Click Score: 11    End of Session Equipment Utilized During Treatment: Gait belt Activity Tolerance: Patient tolerated treatment well Patient left: in chair;with call bell/phone within reach;with family/visitor present   PT Visit Diagnosis: Muscle weakness (generalized) (M62.81);History of falling (Z91.81);Difficulty in walking, not elsewhere classified (R26.2);Unsteadiness on feet (R26.81)     Time: 8499-8464 PT Time Calculation (min) (ACUTE ONLY): 35 min  Charges:    $Gait Training: 8-22 mins $Therapeutic Activity: 8-22 mins PT General Charges $$ ACUTE PT VISIT: 1 Visit                        Dannial SQUIBB, PT Acute Rehabilitation  Office: [phone]

## 2024-07-03 NOTE — NC FL2 (Signed)
 [place]  MEDICAID FL2 LEVEL OF CARE FORM     IDENTIFICATION  Patient Name: Chad Holden Birthdate: 01/21/1934 Sex: male Admission Date (Current Location): 06/30/2024  [facility] and [place] Number:  Producer, television/film/video and Address:  [facility],  [address] [place], [place] [address]      Provider Number: [id number]  Attending Physician Name and Address:  Franchot Novel, MD  Relative Name and Phone Number:  Donzel Romack   [phone]    Current Level of Care: Hospital Recommended Level of Care: Skilled Nursing Facility Prior Approval Number:    Date Approved/Denied:   PASRR Number:    Discharge Plan: SNF    Current Diagnoses: Patient Active Problem List   Diagnosis Date Noted   UTI (urinary tract infection) 07/02/2024   Major depression 06/30/2024   Pure hypercholesterolemia 06/30/2024   Dementia without behavioral disturbance (HCC) 06/30/2024   Fall 06/30/2024   ARF (acute renal failure) 01/31/2018   TIA (transient ischemic attack) 01/30/2018   Lower urinary tract infectious disease 12/24/2015   Acute encephalopathy 12/24/2015   Leukocytosis 12/24/2015   Urine retention 12/24/2015   Confusion 12/24/2015    Orientation RESPIRATION BLADDER Height & Weight     Self, Situation  Normal Indwelling catheter Weight: 93 kg Height:  5' 10 (177.8 cm)  BEHAVIORAL SYMPTOMS/MOOD NEUROLOGICAL BOWEL NUTRITION STATUS      Continent Diet  AMBULATORY STATUS COMMUNICATION OF NEEDS Skin   Extensive Assist Verbally Normal                       Personal Care Assistance Level of Assistance  Bathing, Feeding, Dressing Bathing Assistance: Limited assistance Feeding assistance: Limited assistance Dressing Assistance: Limited assistance     Functional Limitations Info  Sight Sight Info: Impaired        SPECIAL CARE FACTORS FREQUENCY  PT (By licensed PT), OT (By licensed OT), Speech therapy     PT Frequency: 5x weekly OT Frequency: 5x weekly      Speech Therapy Frequency: 5x weekly      Contractures Contractures Info: Not present    Additional Factors Info  Code Status, Allergies, Psychotropic Code Status Info: DNR_Interventions Allergies Info: Hydrocodone Psychotropic Info: Aricept , Effexor          Current Medications (07/03/2024):  This is the current hospital active medication list Current Facility-Administered Medications  Medication Dose Route Frequency Provider Last Rate Last Admin   acetaminophen  (TYLENOL ) tablet 1,000 mg  1,000 mg Oral BID Garba, Mohammad L, MD   1,000 mg at 07/03/24 9075   aspirin  EC tablet 81 mg  81 mg Oral QPM Sim Re L, MD   81 mg at 07/02/24 1638   cefTRIAXone  (ROCEPHIN ) 1 g in sodium chloride  0.9 % 100 mL IVPB  1 g Intravenous Q24H Sim Re L, MD 200 mL/hr at 07/02/24 2159 1 g at 07/02/24 2159   Chlorhexidine Gluconate Cloth 2 % PADS 6 each  6 each Topical Daily Franchot Novel, MD   6 each at 07/02/24 1000   dorzolamide -timolol  (COSOPT ) 2-0.5 % ophthalmic solution 1 drop  1 drop Both Eyes Q12H Sim Re L, MD   1 drop at 07/03/24 0924   enoxaparin  (LOVENOX ) injection 40 mg  40 mg Subcutaneous Q24H Garba, Mohammad L, MD   40 mg at 07/03/24 0923   feeding supplement (ENSURE PLUS HIGH PROTEIN) liquid 237 mL  237 mL Oral BID BM Franchot Novel, MD   237 mL at 07/03/24 [phone]  ibuprofen  (ADVIL ) tablet 200 mg  200 mg Oral Daily Sim Re L, MD   200 mg at 07/03/24 9074   lactated ringers  infusion   Intravenous Continuous Franchot Novel, MD 100 mL/hr at 07/02/24 2045 New Bag at 07/02/24 2045   latanoprost  (XALATAN ) 0.005 % ophthalmic solution 1 drop  1 drop Both Eyes QHS Garba, Mohammad L, MD   1 drop at 07/01/24 2208   memantine  (NAMENDA ) tablet 10 mg  10 mg Oral QHS Franchot Novel, MD       mirabegron  ER (MYRBETRIQ ) tablet 50 mg  50 mg Oral QPM Garba, Mohammad L, MD   50 mg at 07/02/24 1638   ondansetron (ZOFRAN) tablet 4 mg  4 mg Oral Q6H PRN Garba, Mohammad L, MD        Or   ondansetron (ZOFRAN) injection 4 mg  4 mg Intravenous Q6H PRN Garba, Mohammad L, MD       QUEtiapine (SEROQUEL) tablet 12.5 mg  12.5 mg Oral QHS PRN Franchot Novel, MD       tamsulosin  (FLOMAX ) capsule 0.4 mg  0.4 mg Oral QPM Sim Re CROME, MD   0.4 mg at 07/02/24 1638     Discharge Medications: Please see discharge summary for a list of discharge medications.  Relevant Imaging Results:  Relevant Lab Results:   Additional Information SSN [id number]  Doneta Glenys DASEN, RN

## 2024-07-03 NOTE — Progress Notes (Signed)
 Progress Note   Patient: Chad Holden FMW:[id number] DOB: 20-Jun-1934 DOA: 06/30/2024     1 DOS: the patient was seen and examined on 07/03/2024   Brief hospital course: HPI: Chad Holden is a 89 y.o. male with medical history significant of dementia, depression, chronic kidney disease, glaucoma, who was brought in by family after a fall at home.  He normally walks without a cane or walker.  Patient reports weakness.  Patient said he passed out and fell but did not trip or fell.  The family with the patient did not know exactly what happened.  No pain.  He is weak now unable to walk.  They think he may have UTI.  Urinalysis not very convincing however patient seems to have been weaker than usual.  Patient is being admitted for observation and possible workup of UTI.   10/20: Had episode of hypotension and worsening confusion.  That responded to IV fluids.  Etiology was unclear.  Patient's home Aricept  was stopped.  Low suspicion for systemic infection given no leukocytosis or fever. Low suspicion for cardiac etiology. Low suspicion for PE or ptx.    10/21: His blood pressure was mostly normal, did require bolus overnight, thought BP 97 systolic.  Assessment and Plan: S/p Fall  Unclear etiology possibly mechanical. Versus orthosatic.  -Discussed with family to hold aricept   -IV fluids - PT/OT recommending SNF   Possible UTI UCX with no growth. S/p 4 doses of ceftriaxone  will hold on further antibiotics   Hypotension Patient developed hypotension yesterday. He was fluid responsive. He is unable to reliably state if he is having any symptoms. He has no O2 requirement. He is being treated for UA, urine cultures were negative. He has no fever and no leukocytosis. He has mild bleeding in his urine but otherwise no bleeding. His hgb was stable. He has no history of CAD. His cardiac enzymes were mildly elevated and trended flat.  No evidence of acute heart failure.  Consider related to  medications given to patient.  -F/u AM cortisol  -Continue IV fluids until this evening - Will monitor off IV fluids -May not tolerate flomax    Non ischemic myocardial injury  Patient unable to state if symptoms.   Dementia Appears at baseline Holding home aricept  given fall   H/o urinary retention Having some blood tinged urine and urine around foley catheter, reportedly due to bladder spasms.  -continue foley  -continue flomax  and home mybetriq, may not toelrate flomax   -Will need outpatient TOV    Major depression  Continue home medications.   Macrocytic anemia  Thrombocytopenia Was noted to have drop in hgb from ~15 to 11s. Suspect his baseline is closer to !1-12.  Stable this AM.      Latest Ref Rng & Units 07/03/2024    4:42 AM 07/02/2024    2:17 PM 07/02/2024    8:10 AM  CBC  WBC 4.0 - 10.5 K/uL 7.4  10.0  9.1   Hemoglobin 13.0 - 17.0 g/dL 88.7  87.8  88.1   Hematocrit 39.0 - 52.0 % 36.3  37.9  37.3   Platelets 150 - 400 K/uL 146  126  131     Iron b12 folate WNL  -Continue to monitor platelets no need to transfuse       Subjective: Had low blood pressure 97 systolic improved with fluids. Otherwise no issues. .   Physical Exam: Vitals:   07/03/24 0112 07/03/24 0449 07/03/24 0451 07/03/24 1258  BP: ROLLEN)  97/59 118/68 112/75 129/71  Pulse: 91 94 92 93  Resp: 17 17  18   Temp: 98.7 F (37.1 C) 99.3 F (37.4 C)  99 F (37.2 C)  TempSrc: Oral Oral  Oral  SpO2: 99% 95%  99%  Weight:      Height:        Physical Exam  Constitutional: In no distress. Drowsy  Cardiovascular: Normal rate, regular rhythm. No lower extremity edema  Pulmonary: Non labored breathing on room air, no wheezing or rales.   Abdominal: Soft. Non distended and non tender Musculoskeletal: Normal range of motion.     Neurological: Alert and oriented to person. Follows simple commands  Skin: Skin is warm and dry.     Data Reviewed:     Latest Ref Rng & Units 07/03/2024     4:42 AM 07/02/2024    2:17 PM 07/02/2024    8:10 AM  BMP  Glucose 70 - 99 mg/dL 889  95  891   BUN 8 - 23 mg/dL 14  16  15    Creatinine 0.61 - 1.24 mg/dL 9.20  9.02  9.18   Sodium 135 - 145 mmol/L 141  136  138   Potassium 3.5 - 5.1 mmol/L 3.7  3.5  3.5   Chloride 98 - 111 mmol/L 106  101  102   CO2 22 - 32 mmol/L 26  26  26    Calcium  8.9 - 10.3 mg/dL 9.1  9.0  9.2       Latest Ref Rng & Units 07/03/2024    4:42 AM 07/02/2024    2:17 PM 07/02/2024    8:10 AM  CBC  WBC 4.0 - 10.5 K/uL 7.4  10.0  9.1   Hemoglobin 13.0 - 17.0 g/dL 88.7  87.8  88.1   Hematocrit 39.0 - 52.0 % 36.3  37.9  37.3   Platelets 150 - 400 K/uL 146  126  131      Family Communication: spouse  Disposition: Status is: Inpatient  The patient will require care spanning > 2 midnights and should be moved to inpatient because: requring further work up of hypotension. IV fluids   Planned Discharge Destination: pending PT/OT eval    Time spent: 35 minutes  Author: Alban Pepper, MD 07/03/2024 4:37 PM  For on call review [email or web address].

## 2024-07-03 NOTE — Hospital Course (Addendum)
 Consider adding blood pressure support.    Midodrine   Did not get any of his night time medications.   Will stop his donepizil can cause syncope, radycardia  Mostly hypertension  Mybetriq can cause tachycardia and hypertension. Does not take this medicine at home.   Namenda  can cause hypotension rarely and drowsiness. Decrease dose to 10mg  at night.    Check AM cortisol.   Cefixime 400mg 

## 2024-07-03 NOTE — Plan of Care (Signed)

## 2024-07-03 NOTE — Progress Notes (Signed)
       Overnight   NAME: Chad Holden MRN: [id number] DOB : November 01, 1933    Date of Service   07/03/2024   HPI/Events of Note    Notified by RN for borderline BP.  Patient has received a Bolus earlier. BP in 90s currently     Interventions/ Plan   IV Bolus  Continue all Attending orders      Lynwood Kipper BSN MSNA MSN ACNPC-AG Acute Care Nurse Practitioner [facility]

## 2024-07-03 NOTE — TOC Progression Note (Addendum)
 Transition of Care [facility]) - Progression Note    Patient Details  Name: Chad Holden MRN: [id number] Date of Birth: November 02, 1933  Transition of Care [facility]) CM/SW Contact  Autumm Hattery, Glenys DASEN, RN Phone Number:  Clinical Narrative:    CM spoke with patient and [name] (wife) in the room. Both agreeable to starting SNF work-up. PASRR - Level II. Completed FL2 and 30 Day Note. 2:38 PM [place] Must requested documents (FL2, H&P, 30-Day Note) uploaded for PASRR. Waiting on bed offers.  Expected Discharge Plan: Home/Self Care Barriers to Discharge: Continued Medical Work up               Expected Discharge Plan and Services   Discharge Planning Services: CM Consult   Living arrangements for the past 2 months: Single Family Home                 DME Arranged: N/A DME Agency: NA       HH Arranged: NA HH Agency: NA         Social Drivers of Health (SDOH) Interventions SDOH Screenings   Food Insecurity: No Food Insecurity (07/01/2024)  Housing: Low Risk  (07/01/2024)  Transportation Needs: No Transportation Needs (07/01/2024)  Utilities: Not At Risk (07/01/2024)  Social Connections: Socially Integrated (07/01/2024)  Tobacco Use: Low Risk  (07/01/2024)    Readmission Risk Interventions     No data to display

## 2024-07-03 NOTE — TOC PASRR Note (Signed)
 30 Day PASRR Note   Patient Details  Name: Chad Holden Date of Birth: 04/10/1934   Transition of Care [facility]) CM/SW Contact:    Doneta Glenys DASEN, RN Phone Number: 07/03/2024, 2:19 PM  To Whom It May Concern:  Please be advised that this patient will require a short-term nursing home stay - anticipated 30 days or less for rehabilitation and strengthening.   The plan is for return home.

## 2024-07-03 NOTE — Progress Notes (Addendum)
 Speech Language Pathology Treatment: Dysphagia  Patient Details Name: Chad Holden MRN: [id number] DOB: 10-Feb-1934 Today's Date: 07/03/2024 Time: 0920-1015 SLP Time Calculation (min) (ACUTE ONLY): 55 min  Assessment / Plan / Recommendation Clinical Impression  Pt seen for skilled SLP for dysphagia management - His wife is present and assisted with oral care- as pt with drooling from right anterior oral cavity without awareness and leaning to the right.   Imparted to pt and his wife importance of oral care including brushing for pulmonary health, which she advises they conduct at home.   Assisted pt with eating his breakfast including grits, graham cracker, cranberry juice and water .  Mildly prolonged mastication noted with minimal oral retention. He benefited from hand over hand assist due to tremor..  No s/s of aspiration.    Wife reports pt coughs with breakfast cereals *i.e. cold cereal- causing SLP to suspect this is due to difficulty managing mixed consistency- advised to consume solid/liquid separately. Noted per CT neck, pt has cervical spondylosis and anterior osteophytes C6-C7, C7-T1 - that may impair cervical esophageal clearance. No S/S of aspiration with po observed including thin juice, water , grits and moistened graham crackers. Hand over hand assist needed due to pt's tremor. Voice is dysphonic/weak, which wife reports has been present for months with gradual onset.   Reviewed option of continuing pureed/thin diet and monitoring po closely, consume puree/thin diet at rehab and wife bring snacks in when she visits to maximize safety and/or conduct MBS to fully evaluate oropharyngeal swallow.  Advised risk/benefit with all care plans.   Pt has been losing weight per wife- and restricting his diet to only puree/thin may contribute to weight loss, thus recommed MBS to allow viewing of anatomy/physiology of swallow for compensation strategy effectiveness. Wife agreeable to plan for MBS.      HPI HPI: Patient is an 89 y.o. male with medical history significant for dementia, depression, chronic kidney disease, glaucoma, who was brought in by family after a fall at home. The family with the patient did not know exactly what happened. Patient was admitted on 06/30/24 for observation and possible workup of UTI. Patient's wife Chad Holden is at bedside with patient. RN communicated that she's noticed patient pocketing foods and meds. Bedside swallow evaluation ordered to assess patient's swallow.  Pt's wife reports he has had progressive weight loss and his voice has been weak over the last few months with gradual onset.      SLP Plan  MBS;Continue with current plan of care          Recommendations  Diet recommendations: Dysphagia 3 (mechanical soft);Thin liquid Liquids provided via: Cup;Straw Medication Administration: Crushed with puree Supervision: Trained caregiver to feed patient Compensations: Minimize environmental distractions;Small sips/bites;Slow rate;Lingual sweep for clearance of pocketing (hand over hand assist) Postural Changes and/or Swallow Maneuvers: Seated upright 90 degrees;Upright 30-60 min after meal                  Other (Comment) (oral care AFTER meals)     Dysphagia, unspecified (R13.10)     MBS;Continue with current plan of care    Madelin POUR, MS [facility] SLP Acute Rehab Services Office [phone]  Nicolas Emmie Caldron  07/03/2024, 10:47 AM

## 2024-07-04 ENCOUNTER — Inpatient Hospital Stay (HOSPITAL_COMMUNITY)

## 2024-07-04 ENCOUNTER — Other Ambulatory Visit: Payer: Self-pay

## 2024-07-04 DIAGNOSIS — R531 Weakness: Secondary | ICD-10-CM

## 2024-07-04 DIAGNOSIS — Y92009 Unspecified place in unspecified non-institutional (private) residence as the place of occurrence of the external cause: Secondary | ICD-10-CM

## 2024-07-04 DIAGNOSIS — W19XXXA Unspecified fall, initial encounter: Secondary | ICD-10-CM | POA: Diagnosis not present

## 2024-07-04 DIAGNOSIS — E7800 Pure hypercholesterolemia, unspecified: Secondary | ICD-10-CM

## 2024-07-04 DIAGNOSIS — N390 Urinary tract infection, site not specified: Secondary | ICD-10-CM | POA: Diagnosis not present

## 2024-07-04 LAB — CBC
HCT: 37.4 % — ABNORMAL LOW (ref 39.0–52.0)
Hemoglobin: 11.7 g/dL — ABNORMAL LOW (ref 13.0–17.0)
MCH: 32.5 pg (ref 26.0–34.0)
MCHC: 31.3 g/dL (ref 30.0–36.0)
MCV: 103.9 fL — ABNORMAL HIGH (ref 80.0–100.0)
Platelets: 150 K/uL (ref 150–400)
RBC: 3.6 MIL/uL — ABNORMAL LOW (ref 4.22–5.81)
RDW: 12.7 % (ref 11.5–15.5)
WBC: 6.3 K/uL (ref 4.0–10.5)
nRBC: 0 % (ref 0.0–0.2)

## 2024-07-04 LAB — BASIC METABOLIC PANEL WITH GFR
Anion gap: 9 (ref 5–15)
BUN: 13 mg/dL (ref 8–23)
CO2: 28 mmol/L (ref 22–32)
Calcium: 9 mg/dL (ref 8.9–10.3)
Chloride: 107 mmol/L (ref 98–111)
Creatinine, Ser: 0.76 mg/dL (ref 0.61–1.24)
GFR, Estimated: >60 mL/min (ref >60)
Glucose, Bld: 100 mg/dL — ABNORMAL HIGH (ref 70–99)
Potassium: 3.8 mmol/L (ref 3.5–5.1)
Sodium: 143 mmol/L (ref 135–145)

## 2024-07-04 LAB — CORTISOL-AM, BLOOD: Cortisol - AM: 18.3 ug/dL (ref 6.7–22.6)

## 2024-07-04 NOTE — Progress Notes (Signed)
 Speech Language Pathology Treatment: Dysphagia  Patient Details Name: Chad Holden MRN: [id number] DOB: 01/15/1934 Today's Date: 07/04/2024 Time: 8894-8878 SLP Time Calculation (min) (ACUTE ONLY): 16 min  Assessment / Plan / Recommendation Clinical Impression  Today pt seen to determine readiness/appropriateness for MBS.  Pt alert in bed and wife is at bedside.  His intake of breakfast included grits mixed with banana- and Ensure.  He was noted to has pursed lip breathing at end of meal per his wife.  SLP observed him with water  via straw and No s/s of aspiration.  Wife reports pt with some coughing with Ensure but not with foods.  She is currently giving mostly pureed foods.  At this time, as pt is nearing plans to dc to [facility] -and concern for dysphagia present with cervical spondylosis, needing heimlich maneuver, all agree to proceed with MBS. Planned for approx noon to 1230 today.  Full report to follow.     HPI HPI: Patient is an 89 y.o. male with medical history significant for dementia, depression, chronic kidney disease, glaucoma, who was brought in by family after a fall at home. The family with the patient did not know exactly what happened. Patient was admitted on 06/30/24 for observation and possible workup of UTI. Patient's wife [name] is at bedside with patient. RN communicated that she's noticed patient pocketing foods and meds. Bedside swallow evaluation ordered to assess patient's swallow.  Pt's wife reports he has had progressive weight loss and his voice has been weak over the last few months with gradual onset.      SLP Plan  MBS (today at noon to 1230)          Recommendations  Diet recommendations: Regular;Thin liquid Liquids provided via: Cup;Straw Medication Administration: Whole meds with puree Supervision: Patient able to self feed Compensations: Slow rate;Small sips/bites Postural Changes and/or Swallow Maneuvers: Seated upright 90 degrees;Upright 30-60  min after meal                  Oral care BID     Dysphagia, oropharyngeal phase (R13.12)     MBS (today at noon to 1230)   Madelin POUR, MS [facility] SLP Acute Rehab Services Office [phone]   Nicolas Emmie Caldron  07/04/2024, 11:21 AM

## 2024-07-04 NOTE — Plan of Care (Signed)

## 2024-07-04 NOTE — Progress Notes (Signed)
 [facility]                                                                              Nicholaos Schippers, is a 89 y.o. male, DOB - [birth year], FMW:[id number] Admit date - 06/30/2024    Outpatient Primary MD for the patient is Charlott Dorn LABOR, MD  LOS - 2  days  Chief Complaint  Patient presents with   Fall       Brief summary   Patient is a 89 year old male with dementia depression, chronic kidney disease, glaucoma, who was brought in by family after a fall at home. He normally walks without a cane or walker. Patient reports weakness. Patient said he passed out and fell but did not trip or fell. The family with the patient did not know exactly what happened. No pain. He is weak now unable to walk. They think he may have UTI. Urinalysis not very convincing however patient seems to have been weaker than usual.  Patient was admitted for further workup   Assessment & Plan     Mechanical fall -Unclear if orthostatic versus truly mechanical fall -Aricept  held, received IV fluids,  - PT recommended SNF    Possible UTI Urine culture showed no growth.   - Patient had received 4 doses of IV ceftriaxone , hold any further antibiotics    Hypotension -Noted to have hypotension on 10/21, received IV fluid bolus  - BP currently stable, If symptomatic, will place on midodrine TSH 1.46, a.m. cortisol normal 18.3  Mildly elevated troponin - Possibly demand ischemia due to hypotension, troponin 25 ->23 - No acute symptoms of chest pain or shortness of breath.   Dementia Appears at baseline Holding home aricept  given fall    H/o urinary retention - Having some blood tinged urine and urine around foley catheter, reportedly due to bladder spasms.  - Continue Foley, urology following.  Continue Flomax , Myrbetriq      Major depression, dementia Continue Namenda , venlafaxine    Macrocytic anemia  Thrombocytopenia -H&H stable   Estimated body mass index is 29.42  kg/m as calculated from the following:   Height as of this encounter: 5' 10 (1.778 m).   Weight as of this encounter: 93 kg.  Code Status: DNR DVT Prophylaxis:  enoxaparin  (LOVENOX ) injection 40 mg Start: 07/01/24 1000   Level of Care: Level of care: Med-Surg Family Communication: Updated patient Disposition Plan:      Remains inpatient appropriate:   Awaiting SNF   Procedures:    Consultants:   Urology  Antimicrobials:   Anti-infectives (From admission, onward)    Start     Dose/Rate Route Frequency Ordered Stop   07/03/24 2200  cefTRIAXone  (ROCEPHIN ) 1 g in sodium chloride  0.9 % 100 mL IVPB        1 g 200 mL/hr over 30 Minutes Intravenous Every 24 hours 07/03/24 1645 07/03/24 2241   07/01/24 2200  cefTRIAXone  (ROCEPHIN ) 1 g in sodium chloride  0.9 % 100 mL IVPB  Status:  Discontinued        1 g 200 mL/hr over 30 Minutes Intravenous Every 24 hours 07/01/24 0331 07/03/24 1645   06/30/24 2045  cefTRIAXone  (ROCEPHIN ) 1 g in sodium chloride  0.9 % 100 mL IVPB        1 g 200 mL/hr over 30 Minutes Intravenous  Once 06/30/24 2042 06/30/24 2145   06/30/24 0000  cefdinir (OMNICEF) 300 MG capsule        300 mg Oral 2 times daily 06/30/24 2043 07/07/24 2359          Medications  acetaminophen   1,000 mg Oral BID   aspirin  EC  81 mg Oral QPM   Chlorhexidine Gluconate Cloth  6 each Topical Daily   dorzolamide -timolol   1 drop Both Eyes Q12H   enoxaparin  (LOVENOX ) injection  40 mg Subcutaneous Q24H   feeding supplement  237 mL Oral BID BM   ibuprofen   200 mg Oral Daily   latanoprost   1 drop Both Eyes QHS   memantine   10 mg Oral QHS   mirabegron  ER  50 mg Oral QPM   tamsulosin   0.4 mg Oral QPM   venlafaxine  XR  150 mg Oral Q breakfast      Subjective:   Chad Holden was seen and examined today.  No acute complaints.  No fever chills, nausea vomiting, chest pain or shortness of breath.  BP stable.  Objective:   Vitals:   07/03/24 0451 07/03/24 1258 07/03/24 2050  07/04/24 0607  BP: 112/75 129/71 122/76 109/78  Pulse: 92 93 97 92  Resp:  18    Temp:  99 F (37.2 C) (!) 97.4 F (36.3 C) 98.7 F (37.1 C)  TempSrc:  Oral Oral Oral  SpO2:  99% 100% 95%  Weight:      Height:        Intake/Output Summary (Last 24 hours) at 07/04/2024 1215 Last data filed at 07/03/2024 1624 Gross per 24 hour  Intake 240 ml  Output 600 ml  Net -360 ml     Wt Readings from Last 3 Encounters:  07/01/24 93 kg  11/06/21 93 kg  04/15/18 90.7 kg     Exam General: Alert and oriented x self, NAD, Cardiovascular: S1 S2 auscultated,  RRR Respiratory: CTAB Gastrointestinal: Soft, nontender, nondistended, + bowel sounds Ext: no pedal edema bilaterally Neuro: No new deficits Psych: Dementia    Data Reviewed:  I have personally reviewed following labs    CBC Lab Results  Component Value Date   WBC 6.3 07/04/2024   RBC 3.60 (L) 07/04/2024   HGB 11.7 (L) 07/04/2024   HCT 37.4 (L) 07/04/2024   MCV 103.9 (H) 07/04/2024   MCH 32.5 07/04/2024   PLT 150 07/04/2024   MCHC 31.3 07/04/2024   RDW 12.7 07/04/2024   LYMPHSABS 0.8 06/30/2024   MONOABS 0.6 06/30/2024   EOSABS 0.0 06/30/2024   BASOSABS 0.0 06/30/2024     Last metabolic panel Lab Results  Component Value Date   NA 143 07/04/2024   K 3.8 07/04/2024   CL 107 07/04/2024   CO2 28 07/04/2024   BUN 13 07/04/2024   CREATININE 0.76 07/04/2024   GLUCOSE 100 (H) 07/04/2024   GFRNONAA >60 07/04/2024   GFRAA >60 04/15/2018   CALCIUM  9.0 07/04/2024   PROT 6.1 (L) 07/01/2024   ALBUMIN 3.6 07/01/2024   BILITOT 0.3 07/01/2024   ALKPHOS 71 07/01/2024   AST 18 07/01/2024   ALT 9 07/01/2024   ANIONGAP 9 07/04/2024    CBG (last 3)  Recent Labs    07/02/24 1347  GLUCAP 119*      Coagulation Profile: No results for input(s): INR, PROTIME in  the last 168 hours.   Radiology Studies: I have personally reviewed the imaging studies  No results found.     Nydia Distance M.D. [facility] 07/04/2024, 12:15 PM  Available via Epic secure chat 7am-7pm After 7 pm, please refer to night coverage provider listed on amion.

## 2024-07-04 NOTE — TOC Progression Note (Addendum)
 Transition of Care [facility]) - Progression Note    Patient Details  Name: Chad Holden MRN: [id number] Date of Birth: [birth year]  [facility]) CM/SW Contact  Doneta Glenys DASEN, RN Phone Number: 07/04/2024, 9:35 AM  Clinical Narrative:     PASRR # [id number] E Start-07/04/2024 End-08/03/2024  Choice list presented. [facility] is choice Service Provider Request Status Stars Address Phone Patient Preferred  [facility] [place] Preferred SNF  Accepted                               [address] [place] [address] [phone]   [facility] Preferred SNF  Accepted [address] [place] [address] [phone]   3:27 PM CM started shara with LICIA Bale  Expected Discharge Plan: Home/Self Care Barriers to Discharge: Continued Medical Work up               Expected Discharge Plan and Services   Discharge Planning Services: CM Consult   Living arrangements for the past 2 months: Single Family Home                 DME Arranged: N/A DME Agency: NA       HH Arranged: NA HH Agency: NA         Social Drivers of Health (SDOH) Interventions SDOH Screenings   Food Insecurity: No Food Insecurity (07/01/2024)  Housing: Low Risk  (07/01/2024)  Transportation Needs: No Transportation Needs (07/01/2024)  Utilities: Not At Risk (07/01/2024)  Social Connections: Socially Integrated (07/01/2024)  Tobacco Use: Low Risk  (07/01/2024)    Readmission Risk Interventions     No data to display

## 2024-07-04 NOTE — Progress Notes (Signed)
 MBS completed, full report to follow.  Pt appears with retention of secretions in pharynx without awareness.  Pt presents with mild oropharyngeal dysphagia mostly c/b impaired oral coordination resulting in delayed transiting.  Also timing and adequacy of laryngeal closure was sub-optimal resulting in audible aspiration of larger boluses of thinner liquids.     Mild pharyngeal = tongue base retention noted with puree/solid and tablet due to decreased tongue base retraction. Pt has some difficulty initiating swallow per cue - but reflexively swallows to help decrease retention.  Recommend moist/cohesive foods with thin liquids with precautions.  /  Madelin POUR, MS [facility] SLP Acute Rehab Services Office [phone]

## 2024-07-04 NOTE — Procedures (Signed)
 Modified Barium Swallow Study  Patient Details  Name: Chad Holden MRN: [id number] Date of Birth: January 11, 1934  Today's Date: 07/04/2024  Modified Barium Swallow completed.  Full report located under Chart Review in the Imaging Section.  History of Present Illness Patient is an 89 y.o. male with medical history significant for dementia, depression, chronic kidney disease, glaucoma, who was brought in by family after a fall at home. The family with the patient did not know exactly what happened. Patient was admitted on 06/30/24 for observation and possible workup of UTI. Patient's wife Chad Holden is at bedside with patient. RN communicated that she's noticed patient pocketing foods and meds. Bedside swallow evaluation ordered to assess patient's swallow.  Pt's wife reports he has had progressive weight loss and his voice has been weak over the last few months with gradual onset.   Clinical Impression Pt appears with retention of secretions in pharynx without awareness that barium coats.  Pt presents with mild oropharyngeal dysphagia mostly characterized by impaired oral coordination resulting in delayed transiting and occasional premature spillage of boluses into pharynx.  Timing and adequacy of laryngeal closure was sub-optimal resulting in audible aspiration of larger boluses of thinner liquids x1. Use of straw improved timing of airway closure with only trace penetration.   Chin tuck tested and did not worsen swallow.  Mild oropharyngeal residue noted due to decreased tongue base retention noted with puree/solid and tablet.  Pt did not initiate swallow or clear his throat/cough on command due to his cognition.  However, he reflexively swallowed x1 to help decrease vallecular retention of solids.  Recommend moist/cohesive foods with thin liquids with precautions and RMST to improve laryngeal closure/elevation, cough and phonation strength for pulmonary clearance.  Wife present and SLP reviewed MBS  flouroscopy loops with her post-exam.    DIGEST Swallow Severity Rating*  Safety: 2  Efficiency:2  Overall Pharyngeal Swallow Severity:  1: mild; 2: moderate; 3: severe; 4: profound  *The Dynamic Imaging Grade of Swallowing Toxicity is standardized for the head and neck cancer population, however, demonstrates promising clinical applications across populations to standardize the clinical rating of pharyngeal swallow safety and severity.  Factors that may increase risk of adverse event in presence of aspiration Noe & Lianne 2021): Limited mobility;Reduced saliva;Frail or deconditioned;Reduced cognitive function  Swallow Evaluation Recommendations Recommendations: PO diet PO Diet Recommendation: Dysphagia 3 (Mechanical soft);Thin liquids (Level 0) (wife is ordering his meals) Medication Administration: Other (Comment) (as tolerated but with plenty of liquids if prefers liquids) Supervision: Other (comment);Full supervision/cueing for swallowing strategies (wife educated to precautions) Postural changes: Position pt fully upright for meals;Stay upright 30-60 min after meals Oral care recommendations: Oral care BID (2x/day) Caregiver Recommendations: Have oral suction available  Chad POUR, MS [facility] SLP Acute Rehab Services Office [phone]  Chad Holden 07/04/2024,2:27 PM

## 2024-07-04 NOTE — Progress Notes (Signed)
 Mobility Specialist Progress Note:   07/04/24 1635  Mobility  Activity Pivoted/transferred from bed to chair  Level of Assistance Minimal assist, patient does 75% or more  Assistive Device Front wheel walker  Distance Ambulated (ft) 3 ft  Range of Motion/Exercises Active  Activity Response Tolerated well  Mobility Referral Yes  Mobility visit 1 Mobility  Mobility Specialist Start Time (ACUTE ONLY) 1608  Mobility Specialist Stop Time (ACUTE ONLY) 1630  Mobility Specialist Time Calculation (min) (ACUTE ONLY) 22 min   Pt was received in bed and agreed to mobility. Min A from sit to stand to recliner. In addition, Pt agreed to recliner exercises:  Leg extensions: 1 x 5 each leg Sitting marches: 1 x 5 each leg  Returned to recliner with all needs met. Left in room with wife.  [employer] - Mobility Specialist

## 2024-07-05 DIAGNOSIS — R531 Weakness: Secondary | ICD-10-CM | POA: Diagnosis not present

## 2024-07-05 DIAGNOSIS — F0390 Unspecified dementia without behavioral disturbance: Secondary | ICD-10-CM | POA: Diagnosis not present

## 2024-07-05 DIAGNOSIS — N390 Urinary tract infection, site not specified: Secondary | ICD-10-CM | POA: Diagnosis not present

## 2024-07-05 DIAGNOSIS — W19XXXA Unspecified fall, initial encounter: Secondary | ICD-10-CM | POA: Diagnosis not present

## 2024-07-05 LAB — CBC
HCT: 31.9 % — ABNORMAL LOW (ref 39.0–52.0)
Hemoglobin: 10.6 g/dL — ABNORMAL LOW (ref 13.0–17.0)
MCH: 33.7 pg (ref 26.0–34.0)
MCHC: 33.2 g/dL (ref 30.0–36.0)
MCV: 101.3 fL — ABNORMAL HIGH (ref 80.0–100.0)
Platelets: 152 K/uL (ref 150–400)
RBC: 3.15 MIL/uL — ABNORMAL LOW (ref 4.22–5.81)
RDW: 12.7 % (ref 11.5–15.5)
WBC: 7.9 K/uL (ref 4.0–10.5)
nRBC: 0 % (ref 0.0–0.2)

## 2024-07-05 LAB — RENAL FUNCTION PANEL
Albumin: 3.1 g/dL — ABNORMAL LOW (ref 3.5–5.0)
Anion gap: 8 (ref 5–15)
BUN: 14 mg/dL (ref 8–23)
CO2: 28 mmol/L (ref 22–32)
Calcium: 9 mg/dL (ref 8.9–10.3)
Chloride: 103 mmol/L (ref 98–111)
Creatinine, Ser: 0.74 mg/dL (ref 0.61–1.24)
GFR, Estimated: >60 mL/min (ref >60)
Glucose, Bld: 108 mg/dL — ABNORMAL HIGH (ref 70–99)
Phosphorus: 2.6 mg/dL (ref 2.5–4.6)
Potassium: 3.6 mmol/L (ref 3.5–5.1)
Sodium: 139 mmol/L (ref 135–145)

## 2024-07-05 MED ORDER — PHENAZOPYRIDINE HCL 200 MG PO TABS
200.0000 mg | ORAL_TABLET | Freq: Three times a day (TID) | ORAL | Status: DC
Start: 2024-07-05 — End: 2024-07-08
  Administered 2024-07-05 – 2024-07-06 (×4): 200 mg via ORAL
  Filled 2024-07-05 (×6): qty 1

## 2024-07-05 NOTE — Progress Notes (Signed)
 Speech Language Pathology Treatment: Dysphagia  Patient Details Name: Chad Holden MRN: [id number] DOB: 03/22/1934 Today's Date: 07/05/2024 Time: 0950-1020 SLP Time Calculation (min) (ACUTE ONLY): 30 min  Assessment / Plan / Recommendation Clinical Impression  RMST initiated to improve pt's strength of oropharyngeal muscle contraction for improved pharyngeal clearance, cough and phonation via laryngeal closure for communication and airway protection. Due to his dementia, he required several repetitions of blowing on SLP hand to transition into blowing in EMST 75 Lite - which was set at 8 cm H20 pressure. He needed total verbal and visual cues - fading to mod for proper use. His effort was not adequate - however once he generalizes general use - he can improve effort/strength. Requested wife practice with him today x3 sets of 10. She stated We will try. Pt reportedly did not sleep well last night due to bladder spasms - and thus provided assurance to try but not add stress. Pt's intake has been documented as 100% of breakfast - other meals not documented. His vitals are stable thus he is tolerating current diet. Continue diet and SLP for RMST.    HPI HPI: Patient is an 89 y.o. male with medical history significant for dementia, depression, chronic kidney disease, glaucoma, who was brought in by family after a fall at home. The family with the patient did not know exactly what happened. Patient was admitted on 06/30/24 for observation and possible workup of UTI. Patient's wife [name] is at bedside with patient. RN communicated that she's noticed patient pocketing foods and meds. Bedside swallow evaluation ordered to assess patient's swallow.  Pt's wife reports he has had progressive weight loss and his voice has been weak over the last few months with gradual onset.      SLP Plan  Continue with current plan of care          Recommendations  Diet recommendations: Regular;Thin liquid Liquids  provided via: Straw (prefer straw for timing of swallowing) Supervision: Trained caregiver to feed patient Compensations: Slow rate;Small sips/bites;Other (Comment) (can use puree or  [place] ice to help trigger pharyngeal swallow)                  Oral care before and after PO (wife informed how to use suction and provided with toothettes)   Frequent or constant Supervision/Assistance Dysphagia, oropharyngeal phase (R13.12)     Continue with current plan of care    Madelin POUR, MS [facility] SLP Acute Rehab Services Office [phone]  Chad Holden  07/05/2024, 10:36 AM

## 2024-07-05 NOTE — TOC Progression Note (Addendum)
 Transition of Care [facility] - Progression Note    Patient Details  Name: TERREN JANDREAU MRN: [id number] Date of Birth: [birth year]  Transition of Care [facility]) CM/SW Contact  Doneta Glenys DASEN, RN Phone Number: 07/05/2024, 1:49 PM  Clinical Narrative:    CM called HTA Tina for update on auth. Still under review. Patient and son Franky updated in patient room.  Expected Discharge Plan: Home/Self Care Barriers to Discharge: Continued Medical Work up               Expected Discharge Plan and Services   Discharge Planning Services: CM Consult   Living arrangements for the past 2 months: Single Family Home                 DME Arranged: N/A DME Agency: NA       HH Arranged: NA HH Agency: NA         Social Drivers of Health (SDOH) Interventions SDOH Screenings   Food Insecurity: No Food Insecurity (07/01/2024)  Housing: Low Risk  (07/01/2024)  Transportation Needs: No Transportation Needs (07/01/2024)  Utilities: Not At Risk (07/01/2024)  Social Connections: Socially Integrated (07/01/2024)  Tobacco Use: Low Risk  (07/01/2024)    Readmission Risk Interventions     No data to display

## 2024-07-05 NOTE — Progress Notes (Signed)
 [facility]                                                                              Chad Holden, is a 89 y.o. male, DOB - 1934-01-30, FMW:[id number] Admit date - 06/30/2024    Outpatient Primary MD for the patient is Charlott Dorn LABOR, MD  LOS - 3  days  Chief Complaint  Patient presents with   Fall       Brief summary   Patient is a 89 year old male with dementia depression, chronic kidney disease, glaucoma, who was brought in by family after a fall at home. He normally walks without a cane or walker. Patient reports weakness. Patient said he passed out and fell but did not trip or fell. The family with the patient did not know exactly what happened. No pain. He is weak now unable to walk. They think he may have UTI. Urinalysis not very convincing however patient seems to have been weaker than usual.  Patient was admitted for further workup   Assessment & Plan     Mechanical fall -Unclear if orthostatic versus truly mechanical fall -Aricept  held, received IV fluids,  - PT recommended SNF    Possible UTI Urine culture showed no growth.   - Patient had received 4 doses of IV ceftriaxone , hold any further antibiotics    Hypotension -Noted to have hypotension on 10/21, received IV fluid bolus  TSH 1.46, a.m. cortisol normal 18.3 - BP currently stable  Mildly elevated troponin - Possibly demand ischemia due to hypotension, troponin 25 ->23 - No acute symptoms of chest pain or shortness of breath.   Dementia Appears at baseline Holding home aricept  given fall    H/o urinary retention - Having some blood tinged urine and urine around foley catheter, reportedly due to bladder spasms.  - Continue Foley, urology following.  Continue Flomax , Myrbetriq    -Wife reported that patient had significant bladder spasms yesterday, started on Pyridium  200 mg 3 times daily for 3 days   Major depression, dementia Continue Namenda , venlafaxine    Macrocytic  anemia  Thrombocytopenia -H&H stable   Estimated body mass index is 29.42 kg/m as calculated from the following:   Height as of this encounter: 5' 10 (1.778 m).   Weight as of this encounter: 93 kg.  Code Status: DNR DVT Prophylaxis:  enoxaparin  (LOVENOX ) injection 40 mg Start: 07/01/24 1000   Level of Care: Level of care: Med-Surg Family Communication: Updated patient's wife at the bedside Disposition Plan:      Remains inpatient appropriate:   Awaiting SNF   Procedures:    Consultants:   Urology  Antimicrobials:   Anti-infectives (From admission, onward)    Start     Dose/Rate Route Frequency Ordered Stop   07/03/24 2200  cefTRIAXone  (ROCEPHIN ) 1 g in sodium chloride  0.9 % 100 mL IVPB        1 g 200 mL/hr over 30 Minutes Intravenous Every 24 hours 07/03/24 1645 07/03/24 2241   07/01/24 2200  cefTRIAXone  (ROCEPHIN ) 1 g in sodium chloride  0.9 % 100 mL IVPB  Status:  Discontinued        1  g 200 mL/hr over 30 Minutes Intravenous Every 24 hours 07/01/24 0331 07/03/24 1645   06/30/24 2045  cefTRIAXone  (ROCEPHIN ) 1 g in sodium chloride  0.9 % 100 mL IVPB        1 g 200 mL/hr over 30 Minutes Intravenous  Once 06/30/24 2042 06/30/24 2145   06/30/24 0000  cefdinir (OMNICEF) 300 MG capsule        300 mg Oral 2 times daily 06/30/24 2043 07/07/24 2359          Medications  acetaminophen   1,000 mg Oral BID   aspirin  EC  81 mg Oral QPM   Chlorhexidine Gluconate Cloth  6 each Topical Daily   dorzolamide -timolol   1 drop Both Eyes Q12H   enoxaparin  (LOVENOX ) injection  40 mg Subcutaneous Q24H   feeding supplement  237 mL Oral BID BM   ibuprofen   200 mg Oral Daily   latanoprost   1 drop Both Eyes QHS   memantine   10 mg Oral QHS   mirabegron  ER  50 mg Oral QPM   phenazopyridine   200 mg Oral TID WC   tamsulosin   0.4 mg Oral QPM   venlafaxine  XR  150 mg Oral Q breakfast      Subjective:   Chad Holden was seen and examined today.  Appears comfortable currently.  Wife at  the bedside reported bladder spasms yesterday.  No fever chills nausea vomiting, chest pain or shortness of breath.  Objective:   Vitals:   07/04/24 1333 07/04/24 2027 07/04/24 2241 07/05/24 0257  BP: 99/65 135/87 (!) 141/95 105/72  Pulse: 94 (!) 107 82 86  Resp: 18 18 18 18   Temp: 98 F (36.7 C) 99.3 F (37.4 C) 97.6 F (36.4 C) 98.4 F (36.9 C)  TempSrc:  Oral Oral Oral  SpO2:  97% 99% 95%  Weight:      Height:        Intake/Output Summary (Last 24 hours) at 07/05/2024 1230 Last data filed at 07/05/2024 0900 Gross per 24 hour  Intake --  Output 1750 ml  Net -1750 ml     Wt Readings from Last 3 Encounters:  07/01/24 93 kg  11/06/21 93 kg  04/15/18 90.7 kg   Physical Exam General: Alert and oriented x self, NAD, appears comfortable Cardiovascular: S1 S2 clear, RRR.  Respiratory: CTAB, no wheezing Gastrointestinal: Soft, nontender, nondistended, NBS Ext: no pedal edema bilaterally Neuro: no new deficits Psych: Dementia     Data Reviewed:  I have personally reviewed following labs    CBC Lab Results  Component Value Date   WBC 7.9 07/05/2024   RBC 3.15 (L) 07/05/2024   HGB 10.6 (L) 07/05/2024   HCT 31.9 (L) 07/05/2024   MCV 101.3 (H) 07/05/2024   MCH 33.7 07/05/2024   PLT 152 07/05/2024   MCHC 33.2 07/05/2024   RDW 12.7 07/05/2024   LYMPHSABS 0.8 06/30/2024   MONOABS 0.6 06/30/2024   EOSABS 0.0 06/30/2024   BASOSABS 0.0 06/30/2024     Last metabolic panel Lab Results  Component Value Date   NA 139 07/05/2024   K 3.6 07/05/2024   CL 103 07/05/2024   CO2 28 07/05/2024   BUN 14 07/05/2024   CREATININE 0.74 07/05/2024   GLUCOSE 108 (H) 07/05/2024   GFRNONAA >60 07/05/2024   GFRAA >60 04/15/2018   CALCIUM  9.0 07/05/2024   PHOS 2.6 07/05/2024   PROT 6.1 (L) 07/01/2024   ALBUMIN 3.1 (L) 07/05/2024   BILITOT 0.3 07/01/2024   ALKPHOS 71 07/01/2024  AST 18 07/01/2024   ALT 9 07/01/2024   ANIONGAP 8 07/05/2024    CBG (last 3)  Recent Labs     07/02/24 1347  GLUCAP 119*      Coagulation Profile: No results for input(s): INR, PROTIME in the last 168 hours.   Radiology Studies: I have personally reviewed the imaging studies  DG Swallowing Func-Speech Pathology Result Date: 07/04/2024 Table formatting from the original result was not included. Modified Barium Swallow Study Patient Details Name: NATION CRADLE MRN: [id number] Date of Birth: 03/22/1934 Today's Date: 07/04/2024 HPI/PMH: HPI: Patient is an 89 y.o. male with medical history significant for dementia, depression, chronic kidney disease, glaucoma, who was brought in by family after a fall at home. The family with the patient did not know exactly what happened. Patient was admitted on 06/30/24 for observation and possible workup of UTI. Patient's wife [name] is at bedside with patient. RN communicated that she's noticed patient pocketing foods and meds. Bedside swallow evaluation ordered to assess patient's swallow.  Pt's wife reports he has had progressive weight loss and his voice has been weak over the last few months with gradual onset. Clinical Impression: Clinical Impression: Pt appears with retention of secretions in pharynx without awareness that barium coats.  Pt presents with mild oropharyngeal dysphagia mostly characterized by impaired oral coordination resulting in delayed transiting and occasional premature spillage of boluses into pharynx.  Timing and adequacy of laryngeal closure was sub-optimal resulting in audible aspiration of larger boluses of thinner liquids x1. Use of straw improved timing of airway closure with only trace penetration.   Chin tuck tested and did not worsen swallow.  Mild oropharyngeal residue noted due to decreased tongue base retention noted with puree/solid and tablet.  Pt did not initiate swallow or clear his throat/cough on command due to his cognition.  However, he reflexively swallowed x1 to help decrease vallecular retention of solids.   Recommend moist/cohesive foods with thin liquids with precautions and RMST to improve laryngeal closure/elevation, cough and phonation strength for pulmonary clearance.  Wife present and SLP reviewed MBS flouroscopy loops with her post-exam. DIGEST Swallow Severity Rating*  Safety: 2  Efficiency:2  Overall Pharyngeal Swallow Severity: 1: mild; 2: moderate; 3: severe; 4: profound *The Dynamic Imaging Grade of Swallowing Toxicity is standardized for the head and neck cancer population, however, demonstrates promising clinical applications across populations to standardize the clinical rating of pharyngeal swallow safety and severity. Factors that may increase risk of adverse event in presence of aspiration Noe & Lianne 2021): Factors that may increase risk of adverse event in presence of aspiration Noe & Lianne 2021): Limited mobility; Reduced saliva; Frail or deconditioned; Reduced cognitive function Recommendations/Plan: Swallowing Evaluation Recommendations Swallowing Evaluation Recommendations Recommendations: PO diet PO Diet Recommendation: Dysphagia 3 (Mechanical soft); Thin liquids (Level 0) (wife is ordering his meals) Medication Administration: Other (Comment) (as tolerated but with plenty of liquids if prefers liquids) Supervision: Other (comment); Full supervision/cueing for swallowing strategies (wife educated to precautions) Postural changes: Position pt fully upright for meals; Stay upright 30-60 min after meals Oral care recommendations: Oral care BID (2x/day) Caregiver Recommendations: Have oral suction available Treatment Plan Treatment Plan Treatment recommendations: Therapy as outlined in treatment plan below Follow-up recommendations: Skilled nursing-short term rehab (<3 hours/day) Functional status assessment: Patient has had a recent decline in their functional status and demonstrates the ability to make significant improvements in function in a reasonable and predictable amount of  time. Treatment frequency: Min 1x/week Treatment duration: 1  week Interventions: Aspiration precaution training; Patient/family education; Diet toleration management by SLP; Compensatory techniques; Respiratory muscle strength training Recommendations Recommendations for follow up therapy are one component of a multi-disciplinary discharge planning process, led by the attending physician.  Recommendations may be updated based on patient status, additional functional criteria and insurance authorization. Assessment: Orofacial Exam: Orofacial Exam Oral Cavity: Oral Hygiene: Other (comment) (secretion retention) Oral Cavity - Dentition: Adequate natural dentition; Other (Comment) (partial * upper) Orofacial Anatomy: WFL Oral Motor/Sensory Function: Generalized oral weakness Anatomy: Anatomy: Other (Comment) (anterior position of epiglottis closes off vallecular space at times) Boluses Administered: Boluses Administered Boluses Administered: Thin liquids (Level 0); Mildly thick liquids (Level 2, nectar thick); Moderately thick liquids (Level 3, honey thick); Puree; Solid  Oral Impairment Domain: Oral Impairment Domain Lip Closure: No labial escape Tongue control during bolus hold: Posterior escape of less than half of bolus Bolus preparation/mastication: Slow prolonged chewing/mashing with complete recollection Bolus transport/lingual motion: Repetitive/disorganized tongue motion Oral residue: Trace residue lining oral structures Location of oral residue : Tongue Initiation of pharyngeal swallow : Posterior laryngeal surface of the epiglottis (in the trachea with liquids x1)  Pharyngeal Impairment Domain: Pharyngeal Impairment Domain Soft palate elevation: No bolus between soft palate (SP)/pharyngeal wall (PW) Laryngeal elevation: Partial superior movement of thyroid  cartilage/partial approximation of arytenoids to epiglottic petiole Anterior hyoid excursion: Complete anterior movement Epiglottic movement: Partial  inversion Laryngeal vestibule closure: Incomplete, narrow column air/contrast in laryngeal vestibule Pharyngeal stripping wave : Present - diminished Pharyngeal contraction (A/P view only): N/A Pharyngoesophageal segment opening: Partial distention/partial duration, partial obstruction of flow Tongue base retraction: Trace column of contrast or air between tongue base and PPW Pharyngeal residue: Trace residue within or on pharyngeal structures Location of pharyngeal residue: Tongue base; Valleculae; Aryepiglottic folds; Pyriform sinuses  Esophageal Impairment Domain: Esophageal Impairment Domain Esophageal clearance upright position: Esophageal retention Pill: Pill Consistency administered: Thin liquids (Level 0) Thin liquids (Level 0): [facility] Penetration/Aspiration Scale Score: Penetration/Aspiration Scale Score 3.  Material enters airway, remains ABOVE vocal cords and not ejected out: Thin liquids (Level 0) 7.  Material enters airway, passes BELOW cords and not ejected out despite cough attempt by patient: Thin liquids (Level 0) Compensatory Strategies: Compensatory Strategies Compensatory strategies: Yes Straw: Effective Effective Straw: Thin liquid (Level 0) Chin tuck: Effective (tested and did not worsen swallow) Liquid wash: Effective Effective Liquid Wash: Puree Other(comment): Ineffective (cued dry swallows were not performed due to initiation difficulties)   General Information: Caregiver present: Yes  Diet Prior to this Study: Regular; Thin liquids (Level 0)   Temperature : Normal   Respiratory Status: WFL   Supplemental O2: None (Room air)   History of Recent Intubation: No  Behavior/Cognition: Alert; Cooperative; Pleasant mood Self-Feeding Abilities: Able to self-feed Baseline vocal quality/speech: Hypophonia/low volume Volitional Cough: Unable to elicit Volitional Swallow: Unable to elicit Exam Limitations: No limitations Goal Planning: Prognosis for improved oropharyngeal function: Fair Barriers to Reach  Goals: Cognitive deficits No data recorded Patient/Family Stated Goal: wife present Consulted and agree with results and recommendations: Family member/caregiver Pain: Pain Assessment Pain Assessment: No/denies pain Faces Pain Scale: 0 End of Session: Start Time:SLP Start Time (ACUTE ONLY): 1231 Stop Time: SLP Stop Time (ACUTE ONLY): 1301 Time Calculation:SLP Time Calculation (min) (ACUTE ONLY): 30 min Charges: SLP Evaluations $ SLP Speech Visit: 1 Visit SLP Evaluations $MBS Swallow: 1 Procedure $Swallowing Treatment: 1 Procedure $Self Care/Home Management: 23-37 SLP visit diagnosis: SLP Visit Diagnosis: Dysphagia, oropharyngeal phase (R13.12) Past Medical History: Past Medical History:  Diagnosis Date  Dementia (HCC)   Depression   Glaucoma   Renal disorder  Past Surgical History: No past surgical history on file. Madelin POUR, MS [facility] SLP Acute Rehab Services Office [phone] Nicolas Emmie Caldron 07/04/2024, 2:28 PM      Nydia Distance M.D. [facility] 07/05/2024, 12:30 PM  Available via Epic secure chat 7am-7pm After 7 pm, please refer to night coverage provider listed on amion.

## 2024-07-05 NOTE — Progress Notes (Signed)
 Subjective: Patient is smiling this morning and responding to some questions.  He has been struggling with severe spasm since starting to ambulate yesterday.  Objective: Vital signs in last 24 hours: Temp:  [97.6 F (36.4 C)-99.3 F (37.4 C)] 98.2 F (36.8 C) (10/23 1252) Pulse Rate:  [82-107] 84 (10/23 1252) Resp:  [16-18] 16 (10/23 1252) BP: (105-141)/(70-95) 108/70 (10/23 1252) SpO2:  [95 %-99 %] 96 % (10/23 1252)  Assessment/Plan: # Urgent continence # Urinary retention # Bladder spasm # Hematuria-resolved  T OV today.  Would generally prefer to wait to the morning but patient has been having severe spasm and I do not want to start him on antimuscarinics the day before his voiding trial.  Nursing will scan his bladder every 2 and notify on call of difficulty.  It does not appear there was any visible stricture on Dr. [name] operative note.  Therefore, should he scan for 500 mL, or sooner if any extreme discomfort, an 37F coud catheter should be placed.  Do not attempt straight catheterization or In-N-Out catheterization going forward  Regarding his falls and medication: Orthostatic hypotension recorded by PT.  This is right on the threshold of being clinically significant @ .  Reviewed with Dr. [name] and based on the bladder trabeculation visualized on imaging, would suggest leaving on tamsulosin  for the time being.  He has been on this for many years and has historically tolerated well.  Could always trial an alternative alpha-blocker on an outpatient basis if no other culprits are identified.   Hematuria clear  Intake/Output from previous day: 10/22 0701 - 10/23 0700 In: -  Out: 750 [Urine:750]  Intake/Output this shift: Total I/O In: -  Out: 1000 [Urine:1000]  Physical Exam:  General: non-verbal CV: No cyanosis Lungs: equal chest rise Gu: foley in place draining clear yellow urine.  Lab Results: Recent Labs    07/03/24 0442 07/04/24 0445  07/05/24 0434  HGB 11.2* 11.7* 10.6*  HCT 36.3* 37.4* 31.9*   BMET Recent Labs    07/04/24 0445 07/05/24 0434  NA 143 139  K 3.8 3.6  CL 107 103  CO2 28 28  GLUCOSE 100* 108*  BUN 13 14  CREATININE 0.76 0.74  CALCIUM  9.0 9.0  HGB 11.7* 10.6*  WBC 6.3 7.9     Studies/Results: DG Swallowing Func-Speech Pathology Result Date: 07/04/2024 Table formatting from the original result was not included. Modified Barium Swallow Study Patient Details Name: Chad Holden MRN: [id number] Date of Birth: 09-03-1934 Today's Date: 07/04/2024 HPI/PMH: HPI: Patient is an 89 y.o. male with medical history significant for dementia, depression, chronic kidney disease, glaucoma, who was brought in by family after a fall at home. The family with the patient did not know exactly what happened. Patient was admitted on 06/30/24 for observation and possible workup of UTI. Patient's wife Chad Holden is at bedside with patient. RN communicated that she's noticed patient pocketing foods and meds. Bedside swallow evaluation ordered to assess patient's swallow.  Pt's wife reports he has had progressive weight loss and his voice has been weak over the last few months with gradual onset. Clinical Impression: Clinical Impression: Pt appears with retention of secretions in pharynx without awareness that barium coats.  Pt presents with mild oropharyngeal dysphagia mostly characterized by impaired oral coordination resulting in delayed transiting and occasional premature spillage of boluses into pharynx.  Timing and adequacy of laryngeal closure was sub-optimal resulting in audible aspiration of larger boluses of thinner liquids x1.  Use of straw improved timing of airway closure with only trace penetration.   Chin tuck tested and did not worsen swallow.  Mild oropharyngeal residue noted due to decreased tongue base retention noted with puree/solid and tablet.  Pt did not initiate swallow or clear his throat/cough on command due to his  cognition.  However, he reflexively swallowed x1 to help decrease vallecular retention of solids.  Recommend moist/cohesive foods with thin liquids with precautions and RMST to improve laryngeal closure/elevation, cough and phonation strength for pulmonary clearance.  Wife present and SLP reviewed MBS flouroscopy loops with her post-exam. DIGEST Swallow Severity Rating*  Safety: 2  Efficiency:2  Overall Pharyngeal Swallow Severity: 1: mild; 2: moderate; 3: severe; 4: profound *The Dynamic Imaging Grade of Swallowing Toxicity is standardized for the head and neck cancer population, however, demonstrates promising clinical applications across populations to standardize the clinical rating of pharyngeal swallow safety and severity. Factors that may increase risk of adverse event in presence of aspiration Noe & Lianne 2021): Factors that may increase risk of adverse event in presence of aspiration Noe & Lianne 2021): Limited mobility; Reduced saliva; Frail or deconditioned; Reduced cognitive function Recommendations/Plan: Swallowing Evaluation Recommendations Swallowing Evaluation Recommendations Recommendations: PO diet PO Diet Recommendation: Dysphagia 3 (Mechanical soft); Thin liquids (Level 0) (wife is ordering his meals) Medication Administration: Other (Comment) (as tolerated but with plenty of liquids if prefers liquids) Supervision: Other (comment); Full supervision/cueing for swallowing strategies (wife educated to precautions) Postural changes: Position pt fully upright for meals; Stay upright 30-60 min after meals Oral care recommendations: Oral care BID (2x/day) Caregiver Recommendations: Have oral suction available Treatment Plan Treatment Plan Treatment recommendations: Therapy as outlined in treatment plan below Follow-up recommendations: Skilled nursing-short term rehab (<3 hours/day) Functional status assessment: Patient has had a recent decline in their functional status and demonstrates the  ability to make significant improvements in function in a reasonable and predictable amount of time. Treatment frequency: Min 1x/week Treatment duration: 1 week Interventions: Aspiration precaution training; Patient/family education; Diet toleration management by SLP; Compensatory techniques; Respiratory muscle strength training Recommendations Recommendations for follow up therapy are one component of a multi-disciplinary discharge planning process, led by the attending physician.  Recommendations may be updated based on patient status, additional functional criteria and insurance authorization. Assessment: Orofacial Exam: Orofacial Exam Oral Cavity: Oral Hygiene: Other (comment) (secretion retention) Oral Cavity - Dentition: Adequate natural dentition; Other (Comment) (partial * upper) Orofacial Anatomy: WFL Oral Motor/Sensory Function: Generalized oral weakness Anatomy: Anatomy: Other (Comment) (anterior position of epiglottis closes off vallecular space at times) Boluses Administered: Boluses Administered Boluses Administered: Thin liquids (Level 0); Mildly thick liquids (Level 2, nectar thick); Moderately thick liquids (Level 3, honey thick); Puree; Solid  Oral Impairment Domain: Oral Impairment Domain Lip Closure: No labial escape Tongue control during bolus hold: Posterior escape of less than half of bolus Bolus preparation/mastication: Slow prolonged chewing/mashing with complete recollection Bolus transport/lingual motion: Repetitive/disorganized tongue motion Oral residue: Trace residue lining oral structures Location of oral residue : Tongue Initiation of pharyngeal swallow : Posterior laryngeal surface of the epiglottis (in the trachea with liquids x1)  Pharyngeal Impairment Domain: Pharyngeal Impairment Domain Soft palate elevation: No bolus between soft palate (SP)/pharyngeal wall (PW) Laryngeal elevation: Partial superior movement of thyroid  cartilage/partial approximation of arytenoids to epiglottic  petiole Anterior hyoid excursion: Complete anterior movement Epiglottic movement: Partial inversion Laryngeal vestibule closure: Incomplete, narrow column air/contrast in laryngeal vestibule Pharyngeal stripping wave : Present - diminished Pharyngeal contraction (A/P view only):  N/A Pharyngoesophageal segment opening: Partial distention/partial duration, partial obstruction of flow Tongue base retraction: Trace column of contrast or air between tongue base and PPW Pharyngeal residue: Trace residue within or on pharyngeal structures Location of pharyngeal residue: Tongue base; Valleculae; Aryepiglottic folds; Pyriform sinuses  Esophageal Impairment Domain: Esophageal Impairment Domain Esophageal clearance upright position: Esophageal retention Pill: Pill Consistency administered: Thin liquids (Level 0) Thin liquids (Level 0): [facility] Penetration/Aspiration Scale Score: Penetration/Aspiration Scale Score 3.  Material enters airway, remains ABOVE vocal cords and not ejected out: Thin liquids (Level 0) 7.  Material enters airway, passes BELOW cords and not ejected out despite cough attempt by patient: Thin liquids (Level 0) Compensatory Strategies: Compensatory Strategies Compensatory strategies: Yes Straw: Effective Effective Straw: Thin liquid (Level 0) Chin tuck: Effective (tested and did not worsen swallow) Liquid wash: Effective Effective Liquid Wash: Puree Other(comment): Ineffective (cued dry swallows were not performed due to initiation difficulties)   General Information: Caregiver present: Yes  Diet Prior to this Study: Regular; Thin liquids (Level 0)   Temperature : Normal   Respiratory Status: WFL   Supplemental O2: None (Room air)   History of Recent Intubation: No  Behavior/Cognition: Alert; Cooperative; Pleasant mood Self-Feeding Abilities: Able to self-feed Baseline vocal quality/speech: Hypophonia/low volume Volitional Cough: Unable to elicit Volitional Swallow: Unable to elicit Exam Limitations: No  limitations Goal Planning: Prognosis for improved oropharyngeal function: Fair Barriers to Reach Goals: Cognitive deficits No data recorded Patient/Family Stated Goal: wife present Consulted and agree with results and recommendations: Family member/caregiver Pain: Pain Assessment Pain Assessment: No/denies pain Faces Pain Scale: 0 End of Session: Start Time:SLP Start Time (ACUTE ONLY): 1231 Stop Time: SLP Stop Time (ACUTE ONLY): 1301 Time Calculation:SLP Time Calculation (min) (ACUTE ONLY): 30 min Charges: SLP Evaluations $ SLP Speech Visit: 1 Visit SLP Evaluations $MBS Swallow: 1 Procedure $Swallowing Treatment: 1 Procedure $Self Care/Home Management: 23-37 SLP visit diagnosis: SLP Visit Diagnosis: Dysphagia, oropharyngeal phase (R13.12) Past Medical History: Past Medical History: Diagnosis Date  Dementia (HCC)   Depression   Glaucoma   Renal disorder  Past Surgical History: No past surgical history on file. Madelin POUR, MS [facility] SLP Acute Rehab Services Office [phone] Nicolas Emmie Caldron 07/04/2024, 2:28 PM      LOS: 3 days   Ole Bourdon, NP [facility] Pager: [phone]  07/05/2024, 1:38 PM

## 2024-07-05 NOTE — Progress Notes (Signed)
 OT Cancellation Note  Patient Details Name: Chad Holden MRN: [id number] DOB: 07-Nov-1933   Cancelled Treatment:    Reason Eval/Treat Not Completed: Fatigue/lethargy limiting ability to participate  OT attempted visit and patient sound asleep after having ST and PT both earlier this day. OT will continue to follow as schedule allows.   Gustabo Gordillo OT/L Acute Rehabilitation Department  [phone]   07/05/2024, 4:38 PM

## 2024-07-05 NOTE — Plan of Care (Signed)

## 2024-07-05 NOTE — Progress Notes (Signed)
 Physical Therapy Treatment Patient Details Name: Chad Holden MRN: [id number] DOB: November 24, 1933 Today's Date: 07/05/2024   History of Present Illness 89 yo male admitted with weakness, possible UTI, inablity to ambulate after sustaining fall at home. Hx of TIA, falls, dementia, CKD, depression, glaucoma    PT Comments  Pt is progressing toward acute PT goals this session with progression of ambulation distance and decreased assist for ambulation this date. Pt performed bed mobility with MOD A and STS transfers with MOD A+2 progressed to MIN A on 2nd stand trial. Pt ambulated ~63ft with MIN A and close chair follow provided for safety. Pt will benefit from continued skilled PT to increase their independence and maximize safety with mobility.      If plan is discharge home, recommend the following: A lot of help with bathing/dressing/bathroom;Assistance with cooking/housework;Assist for transportation;Help with stairs or ramp for entrance;A lot of help with walking and/or transfers   Can travel by private vehicle     No  Equipment Recommendations  None recommended by PT    Recommendations for Other Services       Precautions / Restrictions Precautions Precautions: Fall Restrictions Weight Bearing Restrictions Per Provider Order: No     Mobility  Bed Mobility Overal bed mobility: Needs Assistance Bed Mobility: Supine to Sit     Supine to sit: Mod assist, HOB elevated, Used rails     General bed mobility comments: Slow processing with all motor commands with multimodal cueing required due to processing delays. Assist for trunk to upright. pt able to progress LEs with increased time and cues    Transfers Overall transfer level: Needs assistance Equipment used: Rolling walker (2 wheels) Transfers: Sit to/from Stand Sit to Stand: Mod assist, +2 physical assistance, +2 safety/equipment   Step pivot transfers: Min assist, +2 safety/equipment       General transfer comment:  Assist to power up, pt wiht self initiation of B UEs on RW to push up. Improved to MIN A +1 to stand from recliner with use of B UEs on recliner armrests for power up. Increased time.    Ambulation/Gait Ambulation/Gait assistance: Min assist Gait Distance (Feet): 50 Feet Assistive device: Rolling walker (2 wheels) Gait Pattern/deviations: Step-through pattern, Decreased stride length, Trunk flexed, Knee flexed in stance - left, Knee flexed in stance - right Gait velocity: decreased     General Gait Details: close chair follow provided. Cues for upright posture and increased hip/knee extension with forward gaze. Pt reporting onset of R leg muscle cramp with ambulation.Difficutly expressing self,  Pt just stopped ambulation and required questioning need for seated rest break to which pt shaking head and replied yes   Stairs             Wheelchair Mobility     Tilt Bed    Modified Rankin (Stroke Patients Only)       Balance Overall balance assessment: Needs assistance, History of Falls Sitting-balance support: Feet supported Sitting balance-Leahy Scale: Fair     Standing balance support: Bilateral upper extremity supported, During functional activity, Reliant on assistive device for balance Standing balance-Leahy Scale: Poor Standing balance comment: reliant on external support                            Communication Communication Communication: No apparent difficulties  Cognition Arousal: Alert     PT - Cognitive impairments: History of cognitive impairments  Following commands: Impaired Following commands impaired: Follows one step commands with increased time, Follows one step commands inconsistently    Cueing Cueing Techniques: Verbal cues, Tactile cues, Visual cues  Exercises      General Comments        Pertinent Vitals/Pain Pain Assessment Pain Assessment: No/denies pain    Home Living                           Prior Function            PT Goals (current goals can now be found in the care plan section) Acute Rehab PT Goals Patient Stated Goal: per wife, home. PT Goal Formulation: With family Time For Goal Achievement: 07/15/24 Potential to Achieve Goals: Good Progress towards PT goals: Progressing toward goals    Frequency    Min 2X/week      PT Plan      Co-evaluation              AM-PAC PT 6 Clicks Mobility   Outcome Measure  Help needed turning from your back to your side while in a flat bed without using bedrails?: A Lot Help needed moving from lying on your back to sitting on the side of a flat bed without using bedrails?: A Lot Help needed moving to and from a bed to a chair (including a wheelchair)?: Total Help needed standing up from a chair using your arms (e.g., wheelchair or bedside chair)?: Total Help needed to walk in hospital room?: A Little Help needed climbing 3-5 steps with a railing? : Total 6 Click Score: 10    End of Session Equipment Utilized During Treatment: Gait belt Activity Tolerance: Patient tolerated treatment well Patient left: in chair;with call bell/phone within reach;with family/visitor present Nurse Communication: Mobility status PT Visit Diagnosis: Muscle weakness (generalized) (M62.81);History of falling (Z91.81);Difficulty in walking, not elsewhere classified (R26.2);Unsteadiness on feet (R26.81)     Time: 8669-8652 PT Time Calculation (min) (ACUTE ONLY): 17 min  Charges:    $Therapeutic Activity: 8-22 mins PT General Charges $$ ACUTE PT VISIT: 1 Visit                    Tinnie BERRY PT, DPT  Acute Rehabilitation Services  Office [phone]  07/05/2024, 1:57 PM

## 2024-07-06 ENCOUNTER — Other Ambulatory Visit (HOSPITAL_COMMUNITY): Payer: Self-pay

## 2024-07-06 ENCOUNTER — Other Ambulatory Visit: Payer: Self-pay

## 2024-07-06 DIAGNOSIS — R400 Somnolence: Secondary | ICD-10-CM | POA: Diagnosis not present

## 2024-07-06 DIAGNOSIS — R2681 Unsteadiness on feet: Secondary | ICD-10-CM | POA: Insufficient documentation

## 2024-07-06 DIAGNOSIS — F329 Major depressive disorder, single episode, unspecified: Secondary | ICD-10-CM | POA: Diagnosis not present

## 2024-07-06 DIAGNOSIS — M545 Low back pain, unspecified: Secondary | ICD-10-CM | POA: Diagnosis not present

## 2024-07-06 DIAGNOSIS — N401 Enlarged prostate with lower urinary tract symptoms: Secondary | ICD-10-CM | POA: Insufficient documentation

## 2024-07-06 DIAGNOSIS — R41841 Cognitive communication deficit: Secondary | ICD-10-CM | POA: Insufficient documentation

## 2024-07-06 DIAGNOSIS — R531 Weakness: Secondary | ICD-10-CM | POA: Diagnosis not present

## 2024-07-06 DIAGNOSIS — F0390 Unspecified dementia without behavioral disturbance: Secondary | ICD-10-CM | POA: Insufficient documentation

## 2024-07-06 DIAGNOSIS — H409 Unspecified glaucoma: Secondary | ICD-10-CM | POA: Diagnosis not present

## 2024-07-06 DIAGNOSIS — R251 Tremor, unspecified: Secondary | ICD-10-CM | POA: Insufficient documentation

## 2024-07-06 DIAGNOSIS — N390 Urinary tract infection, site not specified: Secondary | ICD-10-CM | POA: Diagnosis not present

## 2024-07-06 DIAGNOSIS — G309 Alzheimer's disease, unspecified: Secondary | ICD-10-CM | POA: Diagnosis not present

## 2024-07-06 DIAGNOSIS — N3289 Other specified disorders of bladder: Secondary | ICD-10-CM | POA: Diagnosis not present

## 2024-07-06 DIAGNOSIS — R269 Unspecified abnormalities of gait and mobility: Secondary | ICD-10-CM | POA: Insufficient documentation

## 2024-07-06 DIAGNOSIS — E559 Vitamin D deficiency, unspecified: Secondary | ICD-10-CM | POA: Insufficient documentation

## 2024-07-06 DIAGNOSIS — G939 Disorder of brain, unspecified: Secondary | ICD-10-CM | POA: Insufficient documentation

## 2024-07-06 DIAGNOSIS — D696 Thrombocytopenia, unspecified: Secondary | ICD-10-CM | POA: Insufficient documentation

## 2024-07-06 DIAGNOSIS — R4189 Other symptoms and signs involving cognitive functions and awareness: Secondary | ICD-10-CM | POA: Diagnosis not present

## 2024-07-06 DIAGNOSIS — R112 Nausea with vomiting, unspecified: Secondary | ICD-10-CM | POA: Diagnosis not present

## 2024-07-06 DIAGNOSIS — R7989 Other specified abnormal findings of blood chemistry: Secondary | ICD-10-CM | POA: Diagnosis not present

## 2024-07-06 DIAGNOSIS — G8929 Other chronic pain: Secondary | ICD-10-CM | POA: Diagnosis not present

## 2024-07-06 DIAGNOSIS — F0280 Dementia in other diseases classified elsewhere without behavioral disturbance: Secondary | ICD-10-CM | POA: Insufficient documentation

## 2024-07-06 DIAGNOSIS — Z743 Need for continuous supervision: Secondary | ICD-10-CM | POA: Diagnosis not present

## 2024-07-06 DIAGNOSIS — G9340 Encephalopathy, unspecified: Secondary | ICD-10-CM | POA: Diagnosis not present

## 2024-07-06 DIAGNOSIS — R1310 Dysphagia, unspecified: Secondary | ICD-10-CM | POA: Diagnosis not present

## 2024-07-06 DIAGNOSIS — W19XXXD Unspecified fall, subsequent encounter: Secondary | ICD-10-CM | POA: Diagnosis not present

## 2024-07-06 DIAGNOSIS — R339 Retention of urine, unspecified: Secondary | ICD-10-CM | POA: Diagnosis not present

## 2024-07-06 DIAGNOSIS — W19XXXA Unspecified fall, initial encounter: Secondary | ICD-10-CM | POA: Diagnosis not present

## 2024-07-06 LAB — CBC
HCT: 33.6 % — ABNORMAL LOW (ref 39.0–52.0)
Hemoglobin: 10.7 g/dL — ABNORMAL LOW (ref 13.0–17.0)
MCH: 32.1 pg (ref 26.0–34.0)
MCHC: 31.8 g/dL (ref 30.0–36.0)
MCV: 100.9 fL — ABNORMAL HIGH (ref 80.0–100.0)
Platelets: 162 K/uL (ref 150–400)
RBC: 3.33 MIL/uL — ABNORMAL LOW (ref 4.22–5.81)
RDW: 12.5 % (ref 11.5–15.5)
WBC: 6.4 K/uL (ref 4.0–10.5)
nRBC: 0 % (ref 0.0–0.2)

## 2024-07-06 LAB — RENAL FUNCTION PANEL
Albumin: 3.2 g/dL — ABNORMAL LOW (ref 3.5–5.0)
Anion gap: 9 (ref 5–15)
BUN: 13 mg/dL (ref 8–23)
CO2: 28 mmol/L (ref 22–32)
Calcium: 8.9 mg/dL (ref 8.9–10.3)
Chloride: 102 mmol/L (ref 98–111)
Creatinine, Ser: 0.75 mg/dL (ref 0.61–1.24)
GFR, Estimated: >60 mL/min (ref >60)
Glucose, Bld: 103 mg/dL — ABNORMAL HIGH (ref 70–99)
Phosphorus: 2.7 mg/dL (ref 2.5–4.6)
Potassium: 3.8 mmol/L (ref 3.5–5.1)
Sodium: 138 mmol/L (ref 135–145)

## 2024-07-06 MED ORDER — QUETIAPINE FUMARATE 25 MG PO TABS: 12.5000 mg | ORAL_TABLET | Freq: Every evening | ORAL | 0 refills | Status: AC | PRN

## 2024-07-06 MED ORDER — ONDANSETRON HCL 4 MG PO TABS: 4.0000 mg | ORAL_TABLET | Freq: Four times a day (QID) | ORAL | 0 refills | Status: AC | PRN

## 2024-07-06 MED ORDER — TAMSULOSIN HCL 0.4 MG PO CAPS
0.4000 mg | ORAL_CAPSULE | Freq: Every evening | ORAL | Status: AC
Start: 2024-07-06 — End: ?

## 2024-07-06 MED ORDER — MEMANTINE HCL 10 MG PO TABS: 10.0000 mg | ORAL_TABLET | Freq: Every day | ORAL | Status: AC

## 2024-07-06 MED ORDER — PHENAZOPYRIDINE HCL 200 MG PO TABS
ORAL_TABLET | ORAL | 0 refills | Status: AC
  Filled 2024-07-06: qty 10, 2d supply, fill #0

## 2024-07-06 NOTE — Progress Notes (Signed)
 Report called to [facility]; RN received patient handoff report; all questions answered; awaiting [facility] transport

## 2024-07-06 NOTE — Discharge Summary (Signed)
 Physician Discharge Summary   Patient: Chad Holden MRN: [id number] DOB: [birth year]  Admit date:     06/30/2024  Discharge date: 07/06/24  Discharge Physician: Nydia Distance, MD    PCP: Charlott Dorn LABOR, MD   Recommendations at discharge:   Please continue Pyridium  200 mg p.o. 3 times daily for 2 days and then 3 times daily as needed for bladder spasms Outpatient follow-up with [facility], Dr. McDiarmid in 2 weeks Aricept  held  Discharge Diagnoses:  Mechanical fall Hypotension  Mildly elevated troponin  Acute encephalopathy, superimposed on dementia  Urinary retention Dementia with depression   Pure hypercholesterolemia Macrocytic anemia with thrombocytopenia   Hospital Course:  Patient is a 89 year old male with dementia depression, chronic kidney disease, glaucoma, who was brought in by family after a fall at home. He normally walks without a cane or walker. Patient reports weakness. Patient said he passed out and fell but did not trip or fell. The family with the patient did not know exactly what happened. No pain. He is weak now unable to walk. They think he may have UTI. Urinalysis not very convincing however patient seems to have been weaker than usual.  Patient was admitted for further workup    Assessment and Plan:   Mechanical fall -Unclear if orthostatic versus truly mechanical fall -Aricept  held, received IV fluids  - PT evaluation recommended SNF     Urinary retention - Urine culture showed no growth.   - Patient had received 4 doses of IV ceftriaxone  during hospitalization - Having some blood tinged urine and urine around foley catheter, reportedly due to bladder spasms.  - Foley catheter removed on 10/23, has urinary incontinence but voiding successfully.  Patient was closely followed by urology throughout the hospitalization.   - Started on Pyridium  200 mg 3 times daily for 3 days then continue 3 times daily as needed as needed for bladder  spasms - Continue Myrbetriq    Hypotension -Noted to have hypotension on 10/21, received IV fluid bolus  TSH 1.46, a.m. cortisol normal 18.3 - BP currently stable   Mildly elevated troponin - Possibly demand ischemia due to hypotension, troponin 25 ->23 - No acute symptoms of chest pain or shortness of breath.   Dementia Appears at baseline Holding home aricept  given fall      Major depression, dementia Continue Namenda , venlafaxine      Macrocytic anemia  Thrombocytopenia -H&H stable     Estimated body mass index is 29.42 kg/m as calculated from the following:   Height as of this encounter: 5' 10 (1.778 m).   Weight as of this encounter: 93 kg.      Pain control - [place]  Controlled Substance Reporting System database was reviewed. and patient was instructed, not to drive, operate heavy machinery, perform activities at heights, swimming or participation in water  activities or provide baby-sitting services while on Pain, Sleep and Anxiety Medications; until their outpatient Physician has advised to do so again. Also recommended to not to take more than prescribed Pain, Sleep and Anxiety Medications.  Consultants: Urology Procedures performed: None  Disposition: Skilled nursing facility Diet recommendation:  Discharge Diet Orders (From admission, onward)     Start     Ordered   07/06/24 0000  Diet - low sodium heart healthy        07/06/24 1207            DISCHARGE MEDICATION: Allergies as of 07/06/2024       Reactions   Hydrocodone  Zombie        Medication List     PAUSE taking these medications    donepezil  5 MG tablet Wait to take this until your doctor or other care provider tells you to start again. Commonly known as: ARICEPT  Take 1 tablet (5 mg total) by mouth at bedtime.       STOP taking these medications    amoxicillin -clavulanate 875-125 MG tablet Commonly known as: AUGMENTIN    cephALEXin  500 MG capsule Commonly known  as: KEFLEX        TAKE these medications    Acetaminophen  Extra Strength 500 MG Tabs Commonly known as: TYLENOL  Take 2 tablets (1,000 mg total) by mouth 2 (two) times daily for joint/back pain   Aspirin  Low Dose 81 MG tablet Generic drug: aspirin  EC Take 1 tablet (81 mg total) by mouth every evening.   dorzolamide -timolol  2-0.5 % ophthalmic solution Commonly known as: COSOPT  Place 1 drop into both eyes every 12 (twelve) hours.   ibuprofen  200 MG tablet Commonly known as: ADVIL  Take 1 tablet (200 mg total) by mouth every morning.   latanoprost  0.005 % ophthalmic solution Commonly known as: XALATAN  Place 1 drop into both eyes at bedtime.   memantine  10 MG tablet Commonly known as: NAMENDA  Take 1 tablet (10 mg total) by mouth at bedtime. What changed:  medication strength when to take this   mirabegron  ER 50 MG Tb24 tablet Commonly known as: MYRBETRIQ  Take 50 mg by mouth every evening.   ondansetron 4 MG tablet Commonly known as: ZOFRAN Take 1 tablet (4 mg total) by mouth every 6 (six) hours as needed for nausea.   phenazopyridine  200 MG tablet Commonly known as: PYRIDIUM  Please continue 3 times daily for another 2 days, then 3 times daily as needed for bladder spasms   QUEtiapine 25 MG tablet Commonly known as: SEROQUEL Take 0.5 tablets (12.5 mg total) by mouth at bedtime as needed (agitation).   tamsulosin  0.4 MG Caps capsule Commonly known as: FLOMAX  Take 1 capsule (0.4 mg total) by mouth every evening. Use for urinary retention.   trimethoprim  100 MG tablet Commonly known as: TRIMPEX  Take 1 tablet (100 mg total) by mouth every evening.   venlafaxine  XR 150 MG 24 hr capsule Commonly known as: EFFEXOR -XR Take 1 capsule (150 mg total) by mouth daily with breakfast.   Vitamin D3 50 MCG (2000 UT) Tabs Take 1 tablet (2,000 Units total) by mouth in the morning.        Contact information for follow-up providers     Charlott Dorn LABOR, MD. Schedule an  appointment as soon as possible for a visit in 3 days.   Specialty: Internal Medicine Contact information: [address] [place] [address] [phone]         Gaston Hamilton, MD. Schedule an appointment as soon as possible for a visit in 2 week(s).   Specialty: Urology Why: for hospital follow-up Contact information: [address] [place] [address] [phone]              Contact information for after-discharge care     Destination     [facility]   Service: Skilled Nursing Contact information: [address] [place]  [phone]                    Discharge Exam: Fredricka Weights   07/01/24 1100  Weight: 93 kg   S: Sleepy but easily arousable, wife  at the bedside.  Bladder spasms much better after starting Pyridium .  No fever chills, nausea vomiting, abdominal pain, no acute issues overnight.  BP 105/71   Pulse 79   Temp (!) 97.4 F (36.3 C) (Oral)   Resp 20   Ht 5' 10 (1.778 m)   Wt 93 kg   SpO2 96%   BMI 29.42 kg/m   Physical Exam General: Sleepy but easily arousable, pleasant, wife at the bedside Cardiovascular: S1 S2 clear, RRR.  Respiratory: CTAB, no wheezing Gastrointestinal: Soft, nontender, nondistended, NBS Ext: no pedal edema bilaterally Neuro: no new deficits Psych: Normal affect    Condition at discharge: fair  The results of significant diagnostics from this hospitalization (including imaging, microbiology, ancillary and laboratory) are listed below for reference.   Imaging Studies: DG Swallowing Func-Speech Pathology Result Date: 07/04/2024 Table formatting from the original result was not included. Modified Barium Swallow Study Patient Details Name: JACARI IANNELLO MRN: [id number] Date of Birth: 1934/08/14 Today's Date: 07/04/2024 HPI/PMH: HPI: Patient is an 89 y.o. male with medical history significant for dementia, depression, chronic kidney  disease, glaucoma, who was brought in by family after a fall at home. The family with the patient did not know exactly what happened. Patient was admitted on 06/30/24 for observation and possible workup of UTI. Patient's wife [name] is at bedside with patient. RN communicated that she's noticed patient pocketing foods and meds. Bedside swallow evaluation ordered to assess patient's swallow.  Pt's wife reports he has had progressive weight loss and his voice has been weak over the last few months with gradual onset. Clinical Impression: Clinical Impression: Pt appears with retention of secretions in pharynx without awareness that barium coats.  Pt presents with mild oropharyngeal dysphagia mostly characterized by impaired oral coordination resulting in delayed transiting and occasional premature spillage of boluses into pharynx.  Timing and adequacy of laryngeal closure was sub-optimal resulting in audible aspiration of larger boluses of thinner liquids x1. Use of straw improved timing of airway closure with only trace penetration.   Chin tuck tested and did not worsen swallow.  Mild oropharyngeal residue noted due to decreased tongue base retention noted with puree/solid and tablet.  Pt did not initiate swallow or clear his throat/cough on command due to his cognition.  However, he reflexively swallowed x1 to help decrease vallecular retention of solids.  Recommend moist/cohesive foods with thin liquids with precautions and RMST to improve laryngeal closure/elevation, cough and phonation strength for pulmonary clearance.  Wife present and SLP reviewed MBS flouroscopy loops with her post-exam. DIGEST Swallow Severity Rating*  Safety: 2  Efficiency:2  Overall Pharyngeal Swallow Severity: 1: mild; 2: moderate; 3: severe; 4: profound *The Dynamic Imaging Grade of Swallowing Toxicity is standardized for the head and neck cancer population, however, demonstrates promising clinical applications across populations to  standardize the clinical rating of pharyngeal swallow safety and severity. Factors that may increase risk of adverse event in presence of aspiration Noe & Lianne 2021): Factors that may increase risk of adverse event in presence of aspiration Noe & Lianne 2021): Limited mobility; Reduced saliva; Frail or deconditioned; Reduced cognitive function Recommendations/Plan: Swallowing Evaluation Recommendations Swallowing Evaluation Recommendations Recommendations: PO diet PO Diet Recommendation: Dysphagia 3 (Mechanical soft); Thin liquids (Level 0) (wife is ordering his meals) Medication Administration: Other (Comment) (as tolerated but with plenty of liquids if prefers liquids) Supervision: Other (comment); Full supervision/cueing for swallowing strategies (wife educated to precautions) Postural changes: Position pt fully upright for meals; Stay upright 30-60  min after meals Oral care recommendations: Oral care BID (2x/day) Caregiver Recommendations: Have oral suction available Treatment Plan Treatment Plan Treatment recommendations: Therapy as outlined in treatment plan below Follow-up recommendations: Skilled nursing-short term rehab (<3 hours/day) Functional status assessment: Patient has had a recent decline in their functional status and demonstrates the ability to make significant improvements in function in a reasonable and predictable amount of time. Treatment frequency: Min 1x/week Treatment duration: 1 week Interventions: Aspiration precaution training; Patient/family education; Diet toleration management by SLP; Compensatory techniques; Respiratory muscle strength training Recommendations Recommendations for follow up therapy are one component of a multi-disciplinary discharge planning process, led by the attending physician.  Recommendations may be updated based on patient status, additional functional criteria and insurance authorization. Assessment: Orofacial Exam: Orofacial Exam Oral Cavity: Oral  Hygiene: Other (comment) (secretion retention) Oral Cavity - Dentition: Adequate natural dentition; Other (Comment) (partial * upper) Orofacial Anatomy: WFL Oral Motor/Sensory Function: Generalized oral weakness Anatomy: Anatomy: Other (Comment) (anterior position of epiglottis closes off vallecular space at times) Boluses Administered: Boluses Administered Boluses Administered: Thin liquids (Level 0); Mildly thick liquids (Level 2, nectar thick); Moderately thick liquids (Level 3, honey thick); Puree; Solid  Oral Impairment Domain: Oral Impairment Domain Lip Closure: No labial escape Tongue control during bolus hold: Posterior escape of less than half of bolus Bolus preparation/mastication: Slow prolonged chewing/mashing with complete recollection Bolus transport/lingual motion: Repetitive/disorganized tongue motion Oral residue: Trace residue lining oral structures Location of oral residue : Tongue Initiation of pharyngeal swallow : Posterior laryngeal surface of the epiglottis (in the trachea with liquids x1)  Pharyngeal Impairment Domain: Pharyngeal Impairment Domain Soft palate elevation: No bolus between soft palate (SP)/pharyngeal wall (PW) Laryngeal elevation: Partial superior movement of thyroid  cartilage/partial approximation of arytenoids to epiglottic petiole Anterior hyoid excursion: Complete anterior movement Epiglottic movement: Partial inversion Laryngeal vestibule closure: Incomplete, narrow column air/contrast in laryngeal vestibule Pharyngeal stripping wave : Present - diminished Pharyngeal contraction (A/P view only): N/A Pharyngoesophageal segment opening: Partial distention/partial duration, partial obstruction of flow Tongue base retraction: Trace column of contrast or air between tongue base and PPW Pharyngeal residue: Trace residue within or on pharyngeal structures Location of pharyngeal residue: Tongue base; Valleculae; Aryepiglottic folds; Pyriform sinuses  Esophageal Impairment Domain:  Esophageal Impairment Domain Esophageal clearance upright position: Esophageal retention Pill: Pill Consistency administered: Thin liquids (Level 0) Thin liquids (Level 0): [facility] Penetration/Aspiration Scale Score: Penetration/Aspiration Scale Score 3.  Material enters airway, remains ABOVE vocal cords and not ejected out: Thin liquids (Level 0) 7.  Material enters airway, passes BELOW cords and not ejected out despite cough attempt by patient: Thin liquids (Level 0) Compensatory Strategies: Compensatory Strategies Compensatory strategies: Yes Straw: Effective Effective Straw: Thin liquid (Level 0) Chin tuck: Effective (tested and did not worsen swallow) Liquid wash: Effective Effective Liquid Wash: Puree Other(comment): Ineffective (cued dry swallows were not performed due to initiation difficulties)   General Information: Caregiver present: Yes  Diet Prior to this Study: Regular; Thin liquids (Level 0)   Temperature : Normal   Respiratory Status: WFL   Supplemental O2: None (Room air)   History of Recent Intubation: No  Behavior/Cognition: Alert; Cooperative; Pleasant mood Self-Feeding Abilities: Able to self-feed Baseline vocal quality/speech: Hypophonia/low volume Volitional Cough: Unable to elicit Volitional Swallow: Unable to elicit Exam Limitations: No limitations Goal Planning: Prognosis for improved oropharyngeal function: Fair Barriers to Reach Goals: Cognitive deficits No data recorded Patient/Family Stated Goal: wife present Consulted and agree with results and recommendations: Family member/caregiver Pain: Pain  Assessment Pain Assessment: No/denies pain Faces Pain Scale: 0 End of Session: Start Time:SLP Start Time (ACUTE ONLY): 1231 Stop Time: SLP Stop Time (ACUTE ONLY): 1301 Time Calculation:SLP Time Calculation (min) (ACUTE ONLY): 30 min Charges: SLP Evaluations $ SLP Speech Visit: 1 Visit SLP Evaluations $MBS Swallow: 1 Procedure $Swallowing Treatment: 1 Procedure $Self Care/Home Management: 23-37 SLP  visit diagnosis: SLP Visit Diagnosis: Dysphagia, oropharyngeal phase (R13.12) Past Medical History: Past Medical History: Diagnosis Date  Dementia (HCC)   Depression   Glaucoma   Renal disorder  Past Surgical History: No past surgical history on file. Madelin POUR, MS [facility] SLP Acute Rehab Services Office [phone] Nicolas Emmie Caldron 07/04/2024, 2:28 PM  CT Cervical Spine Wo Contrast Result Date: 06/30/2024 CLINICAL DATA:  Clemens, syncope, neck trauma EXAM: CT CERVICAL SPINE WITHOUT CONTRAST TECHNIQUE: Multidetector CT imaging of the cervical spine was performed without intravenous contrast. Multiplanar CT image reconstructions were also generated. RADIATION DOSE REDUCTION: This exam was performed according to the departmental dose-optimization program which includes automated exposure control, adjustment of the mA and/or kV according to patient size and/or use of iterative reconstruction technique. COMPARISON:  None Available. FINDINGS: Alignment: Alignment is grossly anatomic. Skull base and vertebrae: No acute fracture. No primary bone lesion or focal pathologic process. Soft tissues and spinal canal: No prevertebral fluid or swelling. No visible canal hematoma. Disc levels: Diffuse cervical spondylosis and facet hypertrophy. There is partial bony fusion at C2-3 and C4-5. Marked anterior osteophyte at C6-7 and C7-T1. Upper chest: Airway is patent.  Lung apices are clear. Other: Reconstructed images demonstrate no additional findings. IMPRESSION: 1. No acute cervical spine fracture. 2. Multilevel cervical degenerative changes. Electronically Signed   By: Ozell Daring M.D.   On: 06/30/2024 17:40   CT Head Wo Contrast Result Date: 06/30/2024 CLINICAL DATA:  Clemens, weakness, syncope EXAM: CT HEAD WITHOUT CONTRAST TECHNIQUE: Contiguous axial images were obtained from the base of the skull through the vertex without intravenous contrast. RADIATION DOSE REDUCTION: This exam was performed according to the  departmental dose-optimization program which includes automated exposure control, adjustment of the mA and/or kV according to patient size and/or use of iterative reconstruction technique. COMPARISON:  11/06/2021 FINDINGS: Brain: Stable diffuse cerebral atrophy with ex vacuo dilatation of the lateral ventricles, age appropriate. Chronic small vessel ischemic changes within the periventricular white matter. No acute infarct or hemorrhage. Remaining midline structures are unremarkable. No acute extra-axial fluid collections. No mass effect. Vascular: No hyperdense vessel or unexpected calcification. Skull: Normal. Negative for fracture or focal lesion. Sinuses/Orbits: No acute finding. Other: None. IMPRESSION: 1. Stable head CT, no acute intracranial process. Electronically Signed   By: Ozell Daring M.D.   On: 06/30/2024 17:38   DG Chest Portable 1 View Result Date: 06/30/2024 CLINICAL DATA:  Fall. EXAM: PORTABLE CHEST 1 VIEW COMPARISON:  Chest radiograph dated 11/06/2021. FINDINGS: The heart size and mediastinal contours are within normal limits. Both lungs are clear. The visualized skeletal structures are unremarkable. IMPRESSION: No active disease. Electronically Signed   By: Vanetta Chou M.D.   On: 06/30/2024 15:53    Microbiology: Results for orders placed or performed during the hospital encounter of 06/30/24  Urine Culture     Status: None   Collection Time: 06/30/24  7:33 PM   Specimen: Urine, Clean Catch  Result Value Ref Range Status   Specimen Description   Final    URINE, CLEAN CATCH Performed at [facility], [address] [place], [place] [address]  Special Requests   Final    NONE Performed at [facility], [address] [place], [place] [address]    Culture   Final    NO GROWTH Performed at [facility], [address], [place], [place] [address]    Report Status 07/01/2024 FINAL  Final    Labs: CBC: Recent Labs  Lab  06/30/24 1708 07/01/24 0315 07/02/24 1417 07/03/24 0442 07/04/24 0445 07/05/24 0434 07/06/24 0423  WBC 6.8   < > 10.0 7.4 6.3 7.9 6.4  NEUTROABS 5.3  --   --   --   --   --   --   HGB 15.4   < > 12.1* 11.2* 11.7* 10.6* 10.7*  HCT 46.7   < > 37.9* 36.3* 37.4* 31.9* 33.6*  MCV 100.2*   < > 102.4* 102.0* 103.9* 101.3* 100.9*  PLT 155   < > 126* 146* 150 152 162   < > = values in this interval not displayed.   Basic Metabolic Panel: Recent Labs  Lab 07/02/24 1417 07/03/24 0442 07/04/24 0445 07/05/24 0434 07/06/24 0423  NA 136 141 143 139 138  K 3.5 3.7 3.8 3.6 3.8  CL 101 106 107 103 102  CO2 26 26 28 28 28   GLUCOSE 95 110* 100* 108* 103*  BUN 16 14 13 14 13   CREATININE 0.97 0.79 0.76 0.74 0.75  CALCIUM  9.0 9.1 9.0 9.0 8.9  PHOS  --   --   --  2.6 2.7   Liver Function Tests: Recent Labs  Lab 06/30/24 1708 07/01/24 0315 07/05/24 0434 07/06/24 0423  AST 18 18  --   --   ALT 13 9  --   --   ALKPHOS 103 71  --   --   BILITOT 0.6 0.3  --   --   PROT 7.9 6.1*  --   --   ALBUMIN 4.6 3.6 3.1* 3.2*   CBG: Recent Labs  Lab 07/02/24 1347  GLUCAP 119*    Discharge time spent: greater than 30 minutes.  Signed: Nydia Distance, MD [facility] 07/06/2024

## 2024-07-06 NOTE — TOC Transition Note (Signed)
 Transition of Care [facility]) - Discharge Note   Patient Details  Name: Chad Holden MRN: [id number] Date of Birth: 01/27/1934  [facility] CM/SW Contact:  Doneta Glenys DASEN, RN Phone Number: 07/06/2024, 12:36 PM   Clinical Narrative:    Per MD patient ready for DC to [facility] . RN to call report prior to discharge [phone] Rm [phone]). RN, patient, patient's family, and facility notified of DC. Discharge Summary and FL2 sent to facility vis [facility]. DC packet on chart includes face sheet, medical necessity, signed prescription and DNR form. Ambulance PTAR  transport requested for patient.   Please consult us  again if new needs arise.   Barriers to Discharge: Continued Medical Work up   Patient Goals and CMS Choice Patient states their goals for this hospitalization and ongoing recovery are:: home CMS Medicare.gov Compare Post Acute Care list provided to:: Patient Represenative (must comment) [name] (spouse))   [place] ownership interest in [facility].provided to:: Spouse    Discharge Placement                       Discharge Plan and Services Additional resources added to the After Visit Summary for     Discharge Planning Services: CM Consult            DME Arranged: N/A DME Agency: NA       HH Arranged: NA HH Agency: NA        Social Drivers of Health (SDOH) Interventions SDOH Screenings   Food Insecurity: No Food Insecurity (07/01/2024)  Housing: Low Risk  (07/01/2024)  Transportation Needs: No Transportation Needs (07/01/2024)  Utilities: Not At Risk (07/01/2024)  Social Connections: Socially Integrated (07/01/2024)  Tobacco Use: Low Risk  (07/01/2024)     Readmission Risk Interventions     No data to display

## 2024-07-06 NOTE — Plan of Care (Signed)

## 2024-07-06 NOTE — TOC Progression Note (Signed)
 Transition of Care [facility]) - Progression Note    Patient Details  Name: Chad Holden MRN: [id number] Date of Birth: 1933-12-23  Transition of Care [facility]) CM/SW Contact  Doneta Glenys DASEN, RN Phone Number: 07/06/2024, 11:04 AM  Clinical Narrative:    [facility] [employer] [place] called. Patient approved for SNF Auth# [id number] for 7 days, PTAR Auth# - [id number] for 90 days. Patient has 5 (10/30) business days to admit to SNF.   Expected Discharge Plan: Home/Self Care Barriers to Discharge: Continued Medical Work up               Expected Discharge Plan and Services   Discharge Planning Services: CM Consult   Living arrangements for the past 2 months: Single Family Home                 DME Arranged: N/A DME Agency: NA       HH Arranged: NA HH Agency: NA         Social Drivers of Health (SDOH) Interventions SDOH Screenings   Food Insecurity: No Food Insecurity (07/01/2024)  Housing: Low Risk  (07/01/2024)  Transportation Needs: No Transportation Needs (07/01/2024)  Utilities: Not At Risk (07/01/2024)  Social Connections: Socially Integrated (07/01/2024)  Tobacco Use: Low Risk  (07/01/2024)    Readmission Risk Interventions     No data to display

## 2024-07-06 NOTE — Plan of Care (Signed)
  Problem: Education: Goal: Knowledge of General Education information will improve Description: Including pain rating scale, medication(s)/side effects and non-pharmacologic comfort measures Outcome: Adequate for Discharge   Problem: Health Behavior/Discharge Planning: Goal: Ability to manage health-related needs will improve Outcome: Adequate for Discharge   Problem: Clinical Measurements: Goal: Ability to maintain clinical measurements within normal limits will improve Outcome: Adequate for Discharge Goal: Will remain free from infection Outcome: Adequate for Discharge Goal: Diagnostic test results will improve Outcome: Adequate for Discharge Goal: Respiratory complications will improve Outcome: Adequate for Discharge Goal: Cardiovascular complication will be avoided Outcome: Adequate for Discharge   Problem: Activity: Goal: Risk for activity intolerance will decrease Outcome: Adequate for Discharge   Problem: Nutrition: Goal: Adequate nutrition will be maintained Outcome: Adequate for Discharge   Problem: Coping: Goal: Level of anxiety will decrease Outcome: Adequate for Discharge   Problem: Pain Managment: Goal: General experience of comfort will improve and/or be controlled Outcome: Adequate for Discharge   Problem: Safety: Goal: Ability to remain free from injury will improve Outcome: Adequate for Discharge   Problem: Skin Integrity: Goal: Risk for impaired skin integrity will decrease Outcome: Adequate for Discharge

## 2024-07-06 NOTE — Progress Notes (Signed)
 Contacted Ole Bras, NP, to ask if he is ok with patient discharge to SNF from his standpoint; notified him that patient is voiding and had 52 ml residual in bladder, per scan, 2 hours post void; stated that it is ok to discharge from his standpoint and that patient's family should consider not using briefs; notified family of message and Dr. Davia regarding discharge; will continue to monitor

## 2024-07-06 NOTE — Progress Notes (Signed)
 Multiple bladder scans and PVR under 60 mL.  Thankfully no further signs of obstructive uropathy.  Cleared to discharge from urologic perspective.

## 2024-07-08 DIAGNOSIS — R2681 Unsteadiness on feet: Secondary | ICD-10-CM | POA: Diagnosis not present

## 2024-07-08 DIAGNOSIS — R1310 Dysphagia, unspecified: Secondary | ICD-10-CM | POA: Diagnosis not present

## 2024-07-08 DIAGNOSIS — M545 Low back pain, unspecified: Secondary | ICD-10-CM | POA: Diagnosis not present

## 2024-07-08 DIAGNOSIS — G309 Alzheimer's disease, unspecified: Secondary | ICD-10-CM | POA: Diagnosis not present

## 2024-07-08 DIAGNOSIS — R400 Somnolence: Secondary | ICD-10-CM | POA: Diagnosis not present

## 2024-07-08 DIAGNOSIS — R251 Tremor, unspecified: Secondary | ICD-10-CM | POA: Diagnosis not present

## 2024-07-08 DIAGNOSIS — F0280 Dementia in other diseases classified elsewhere without behavioral disturbance: Secondary | ICD-10-CM | POA: Diagnosis not present

## 2024-07-08 DIAGNOSIS — G8929 Other chronic pain: Secondary | ICD-10-CM | POA: Diagnosis not present

## 2024-07-10 ENCOUNTER — Other Ambulatory Visit (HOSPITAL_COMMUNITY): Payer: Self-pay

## 2024-07-10 ENCOUNTER — Other Ambulatory Visit: Payer: Self-pay

## 2024-07-11 ENCOUNTER — Other Ambulatory Visit: Payer: Self-pay

## 2024-07-31 DIAGNOSIS — D539 Nutritional anemia, unspecified: Secondary | ICD-10-CM | POA: Diagnosis not present

## 2024-07-31 DIAGNOSIS — D696 Thrombocytopenia, unspecified: Secondary | ICD-10-CM | POA: Diagnosis not present

## 2024-07-31 DIAGNOSIS — F02A3 Dementia in other diseases classified elsewhere, mild, with mood disturbance: Secondary | ICD-10-CM | POA: Diagnosis not present

## 2024-07-31 DIAGNOSIS — F02A2 Dementia in other diseases classified elsewhere, mild, with psychotic disturbance: Secondary | ICD-10-CM | POA: Diagnosis not present

## 2024-07-31 DIAGNOSIS — R1310 Dysphagia, unspecified: Secondary | ICD-10-CM | POA: Diagnosis not present

## 2024-07-31 DIAGNOSIS — N3289 Other specified disorders of bladder: Secondary | ICD-10-CM | POA: Diagnosis not present

## 2024-07-31 DIAGNOSIS — Z9181 History of falling: Secondary | ICD-10-CM | POA: Diagnosis not present

## 2024-07-31 DIAGNOSIS — M47812 Spondylosis without myelopathy or radiculopathy, cervical region: Secondary | ICD-10-CM | POA: Diagnosis not present

## 2024-07-31 DIAGNOSIS — E7800 Pure hypercholesterolemia, unspecified: Secondary | ICD-10-CM | POA: Diagnosis not present

## 2024-07-31 DIAGNOSIS — H409 Unspecified glaucoma: Secondary | ICD-10-CM | POA: Diagnosis not present

## 2024-07-31 DIAGNOSIS — N189 Chronic kidney disease, unspecified: Secondary | ICD-10-CM | POA: Diagnosis not present

## 2024-07-31 DIAGNOSIS — M5030 Other cervical disc degeneration, unspecified cervical region: Secondary | ICD-10-CM | POA: Diagnosis not present

## 2024-07-31 DIAGNOSIS — F329 Major depressive disorder, single episode, unspecified: Secondary | ICD-10-CM | POA: Diagnosis not present

## 2024-07-31 DIAGNOSIS — G8929 Other chronic pain: Secondary | ICD-10-CM | POA: Diagnosis not present

## 2024-07-31 DIAGNOSIS — Z87440 Personal history of urinary (tract) infections: Secondary | ICD-10-CM | POA: Diagnosis not present

## 2024-07-31 DIAGNOSIS — Z7982 Long term (current) use of aspirin: Secondary | ICD-10-CM | POA: Diagnosis not present

## 2024-07-31 DIAGNOSIS — I129 Hypertensive chronic kidney disease with stage 1 through stage 4 chronic kidney disease, or unspecified chronic kidney disease: Secondary | ICD-10-CM | POA: Diagnosis not present

## 2024-07-31 DIAGNOSIS — G309 Alzheimer's disease, unspecified: Secondary | ICD-10-CM | POA: Diagnosis not present

## 2024-07-31 DIAGNOSIS — E559 Vitamin D deficiency, unspecified: Secondary | ICD-10-CM | POA: Diagnosis not present

## 2024-08-01 ENCOUNTER — Other Ambulatory Visit (HOSPITAL_COMMUNITY): Payer: Self-pay

## 2024-08-01 ENCOUNTER — Other Ambulatory Visit: Payer: Self-pay

## 2024-08-01 MED ORDER — MEMANTINE HCL 10 MG PO TABS
10.0000 mg | ORAL_TABLET | Freq: Two times a day (BID) | ORAL | 2 refills | Status: AC
  Filled 2024-08-01: qty 60, 30d supply, fill #0
  Filled 2024-08-06: qty 38, 19d supply, fill #0
  Filled 2024-08-17 – 2024-08-23 (×2): qty 60, 30d supply, fill #1
  Filled 2024-09-17: qty 60, 30d supply, fill #2
  Filled 2024-10-15: qty 60, 30d supply, fill #3

## 2024-08-01 MED ORDER — TAMSULOSIN HCL 0.4 MG PO CAPS
0.4000 mg | ORAL_CAPSULE | Freq: Every day | ORAL | 1 refills | Status: AC
  Filled 2024-08-01: qty 30, 30d supply, fill #0
  Filled 2024-08-06: qty 19, 19d supply, fill #0
  Filled 2024-08-17 – 2024-08-23 (×2): qty 30, 30d supply, fill #1
  Filled 2024-09-17: qty 30, 30d supply, fill #2
  Filled 2024-10-15: qty 30, 30d supply, fill #3

## 2024-08-01 MED ORDER — TRIMETHOPRIM 100 MG PO TABS
100.0000 mg | ORAL_TABLET | Freq: Every evening | ORAL | 2 refills | Status: AC
  Filled 2024-08-01 – 2024-08-23 (×3): qty 30, 30d supply, fill #0
  Filled 2024-09-17: qty 30, 30d supply, fill #1
  Filled 2024-10-15: qty 30, 30d supply, fill #2

## 2024-08-01 MED ORDER — VENLAFAXINE HCL 75 MG PO TABS
75.0000 mg | ORAL_TABLET | Freq: Two times a day (BID) | ORAL | 2 refills | Status: AC
  Filled 2024-08-01: qty 30, 30d supply, fill #0
  Filled 2024-08-13: qty 26, 13d supply, fill #0
  Filled 2024-08-17 – 2024-08-23 (×2): qty 60, 30d supply, fill #1
  Filled 2024-09-17: qty 60, 30d supply, fill #2
  Filled 2024-10-15: qty 60, 30d supply, fill #3

## 2024-08-06 ENCOUNTER — Other Ambulatory Visit: Payer: Self-pay

## 2024-08-06 DIAGNOSIS — Z79899 Other long term (current) drug therapy: Secondary | ICD-10-CM | POA: Diagnosis not present

## 2024-08-06 DIAGNOSIS — Z7189 Other specified counseling: Secondary | ICD-10-CM | POA: Diagnosis not present

## 2024-08-06 DIAGNOSIS — F09 Unspecified mental disorder due to known physiological condition: Secondary | ICD-10-CM | POA: Diagnosis not present

## 2024-08-06 DIAGNOSIS — G319 Degenerative disease of nervous system, unspecified: Secondary | ICD-10-CM | POA: Diagnosis not present

## 2024-08-06 DIAGNOSIS — K5900 Constipation, unspecified: Secondary | ICD-10-CM | POA: Diagnosis not present

## 2024-08-06 DIAGNOSIS — F330 Major depressive disorder, recurrent, mild: Secondary | ICD-10-CM | POA: Diagnosis not present

## 2024-08-06 DIAGNOSIS — R1312 Dysphagia, oropharyngeal phase: Secondary | ICD-10-CM | POA: Diagnosis not present

## 2024-08-06 DIAGNOSIS — F03B18 Unspecified dementia, moderate, with other behavioral disturbance: Secondary | ICD-10-CM | POA: Diagnosis not present

## 2024-08-06 DIAGNOSIS — R5381 Other malaise: Secondary | ICD-10-CM | POA: Diagnosis not present

## 2024-08-06 DIAGNOSIS — M4800 Spinal stenosis, site unspecified: Secondary | ICD-10-CM | POA: Diagnosis not present

## 2024-08-08 ENCOUNTER — Other Ambulatory Visit (HOSPITAL_COMMUNITY): Payer: Self-pay

## 2024-08-08 ENCOUNTER — Other Ambulatory Visit: Payer: Self-pay

## 2024-08-08 MED ORDER — DORZOLAMIDE HCL-TIMOLOL MAL 2-0.5 % OP SOLN
1.0000 [drp] | Freq: Two times a day (BID) | OPHTHALMIC | 4 refills | Status: AC
Start: 2024-08-07 — End: ?
  Filled 2024-08-08: qty 20, 100d supply, fill #0

## 2024-08-10 ENCOUNTER — Other Ambulatory Visit: Payer: Self-pay

## 2024-08-10 ENCOUNTER — Other Ambulatory Visit (HOSPITAL_COMMUNITY): Payer: Self-pay

## 2024-08-10 MED ORDER — NITROFURANTOIN MONOHYD MACRO 100 MG PO CAPS
100.0000 mg | ORAL_CAPSULE | Freq: Two times a day (BID) | ORAL | 0 refills | Status: AC
  Filled 2024-08-10 – 2024-08-12 (×2): qty 20, 10d supply, fill #0

## 2024-08-12 ENCOUNTER — Other Ambulatory Visit (HOSPITAL_COMMUNITY): Payer: Self-pay

## 2024-08-13 ENCOUNTER — Other Ambulatory Visit (HOSPITAL_COMMUNITY): Payer: Self-pay

## 2024-08-13 ENCOUNTER — Other Ambulatory Visit: Payer: Self-pay

## 2024-08-14 ENCOUNTER — Other Ambulatory Visit: Payer: Self-pay

## 2024-08-17 ENCOUNTER — Other Ambulatory Visit (HOSPITAL_COMMUNITY): Payer: Self-pay

## 2024-08-17 ENCOUNTER — Other Ambulatory Visit: Payer: Self-pay

## 2024-08-18 ENCOUNTER — Other Ambulatory Visit (HOSPITAL_COMMUNITY): Payer: Self-pay

## 2024-08-21 ENCOUNTER — Other Ambulatory Visit: Payer: Self-pay

## 2024-08-23 ENCOUNTER — Other Ambulatory Visit (HOSPITAL_COMMUNITY): Payer: Self-pay

## 2024-08-23 ENCOUNTER — Other Ambulatory Visit: Payer: Self-pay

## 2024-08-26 ENCOUNTER — Other Ambulatory Visit (HOSPITAL_COMMUNITY): Payer: Self-pay

## 2024-08-28 ENCOUNTER — Other Ambulatory Visit: Payer: Self-pay

## 2024-09-19 ENCOUNTER — Other Ambulatory Visit: Payer: Self-pay

## 2024-09-25 ENCOUNTER — Other Ambulatory Visit: Payer: Self-pay

## 2024-09-26 ENCOUNTER — Other Ambulatory Visit: Payer: Self-pay

## 2024-10-09 ENCOUNTER — Other Ambulatory Visit: Payer: Self-pay

## 2024-10-10 ENCOUNTER — Ambulatory Visit: Admission: EM | Admit: 2024-10-10 | Discharge: 2024-10-10 | Disposition: A | Discharge status: Home / Self Care

## 2024-10-10 ENCOUNTER — Encounter: Payer: Self-pay | Admitting: Emergency Medicine

## 2024-10-10 ENCOUNTER — Ambulatory Visit (INDEPENDENT_AMBULATORY_CARE_PROVIDER_SITE_OTHER)

## 2024-10-10 DIAGNOSIS — M5430 Sciatica, unspecified side: Secondary | ICD-10-CM | POA: Insufficient documentation

## 2024-10-10 DIAGNOSIS — R059 Cough, unspecified: Secondary | ICD-10-CM | POA: Diagnosis not present

## 2024-10-10 DIAGNOSIS — K4090 Unilateral inguinal hernia, without obstruction or gangrene, not specified as recurrent: Secondary | ICD-10-CM | POA: Insufficient documentation

## 2024-10-10 DIAGNOSIS — G319 Degenerative disease of nervous system, unspecified: Secondary | ICD-10-CM | POA: Insufficient documentation

## 2024-10-10 DIAGNOSIS — F09 Unspecified mental disorder due to known physiological condition: Secondary | ICD-10-CM | POA: Insufficient documentation

## 2024-10-10 NOTE — Discharge Instructions (Signed)
 No signs of pneumonia.  I believe his symptoms may be associated with his eating, I believe it is worth speak with speech therapy for re-evaluation

## 2024-10-10 NOTE — ED Triage Notes (Signed)
 Pt here with family who reports productive cough and chest congestion x5 day. Pt's wife notes she initially thought it was food and spit rattling around. Pt's at home PT told wife that he heard congestion in R lung and he should be checked out earlier today.

## 2024-10-11 NOTE — ED Provider Notes (Signed)
 " [facility]    CSN: [id number] Arrival date & time: 10/10/24  1901      History   Chief Complaint Chief Complaint  Patient presents with   Cough   chest congestion    HPI Chad Holden is a 89 y.o. male.   Pt presents today with wife and son, due to concern for chest congestion. Pt's physical therapist states that he heard congestion in his lungs during his home visit today. Pt was advised to have it evaluated. Wife and son states that he has been coughing only while eating. Pt is not coughing outside of eating. Pt has a speech therapist and has been changed to pureed diet for at least 3 weeks or so.   The history is provided by the patient.  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   Abnormal gait 07/06/2024   Asthenia 07/06/2024   Benign prostatic hyperplasia with urinary obstruction 07/06/2024   Dementia (HCC) 07/06/2024   Thrombocytopenic disorder 07/06/2024   Unsteadiness on feet 07/06/2024   Vitamin D deficiency 07/06/2024   Tremor 07/06/2024   Alzheimer's disease (HCC) 07/06/2024   Cognitive communication disorder 07/06/2024   Disorder of brain 07/06/2024   UTI (urinary tract infection) 07/02/2024   Major depression, single episode 06/30/2024   Pure hypercholesterolemia 06/30/2024   Dementia without behavioral disturbance (HCC) 06/30/2024   Fall 06/30/2024   ARF (acute renal failure) 01/31/2018   Transient ischemic attack 01/30/2018   Disorder of bladder 12/24/2015   Acute encephalopathy 12/24/2015   Leukocytosis 12/24/2015   Urine retention 12/24/2015   Confusion 12/24/2015    History reviewed. No pertinent surgical history.     Home Medications    Prior  to Admission medications  Medication Sig Start Date End Date Taking? Authorizing Provider  acetaminophen  (TYLENOL ) 500 MG tablet Take 2 tablets (1,000 mg total) by mouth 2 (two) times daily for joint/back pain 12/22/23  Yes   aspirin  EC (ASPIRIN  ADULT LOW DOSE) 81 MG tablet Take 1 tablet (81 mg total) by mouth every evening. 12/22/23  Yes   cephALEXin  (KEFLEX ) 500 MG capsule Take by mouth 2 (two) times daily. 10/04/24  Yes [provider]  Cholecalciferol  (VITAMIN D3) 50 MCG (2000 UT) TABS Take 1 tablet (2,000 Units total) by mouth in the morning. 05/15/24  Yes   dorzolamide -timolol  (COSOPT ) 2-0.5 % ophthalmic solution Place 1 drop into both eyes every 12 (twelve) hours. 07/11/23  Yes   ibuprofen  (ADVIL ) 200 MG tablet Take 1 tablet (200 mg total) by mouth every morning. 05/15/24  Yes   latanoprost  (XALATAN ) 0.005 % ophthalmic solution Place 1 drop into both eyes at bedtime.  12/18/15  Yes [provider]  memantine  (NAMENDA ) 10 MG tablet Take 1 tablet (10 mg total) by mouth at bedtime. 07/06/24  Yes Rai, Ripudeep K, MD  tamsulosin  (FLOMAX ) 0.4 MG CAPS capsule Take 1 capsule (0.4 mg total) by mouth every evening. Use for urinary retention. 07/06/24  Yes Rai, Ripudeep K, MD  trimethoprim  (TRIMPEX ) 100 MG tablet Take 1 tablet (100 mg total) by mouth every evening. 08/01/24  Yes   venlafaxine  (EFFEXOR ) 75 MG tablet Take 1 tablet (75 mg  total) by mouth 2 (two) times daily. 08/01/24  Yes   [Paused] donepezil  (ARICEPT ) 5 MG tablet Take 1 tablet (5 mg total) by mouth at bedtime. Wait to take this until your doctor or other care provider tells you to start again. 06/13/24     dorzolamide -timolol  (COSOPT ) 2-0.5 % ophthalmic solution Place 1 drop into both eyes every 12 (twelve) hours. 08/07/24     memantine  (NAMENDA ) 10 MG tablet Take 1 tablet (10 mg total) by mouth in the morning and at bedtime. 08/01/24     mirabegron  ER (MYRBETRIQ ) 50 MG TB24 tablet Take 50 mg by mouth every evening.  Patient not  taking: Reported on 10/10/2024 04/26/16   [provider]  nitrofurantoin , macrocrystal-monohydrate, (MACROBID ) 100 MG capsule Take 1 capsule (100 mg total) by mouth 2 (two) times daily with food. Patient not taking: Reported on 10/10/2024 08/10/24     ondansetron  (ZOFRAN ) 4 MG tablet Take 1 tablet (4 mg total) by mouth every 6 (six) hours as needed for nausea. Patient not taking: Reported on 10/10/2024 07/06/24   Davia Nydia POUR, MD  phenazopyridine  (PYRIDIUM ) 200 MG tablet Please continue 3 times daily for another 2 days, then 3 times daily as needed for bladder spasms Patient not taking: Reported on 10/10/2024 07/06/24   Rai, Nydia POUR, MD  QUEtiapine  (SEROQUEL ) 25 MG tablet Take 0.5 tablets (12.5 mg total) by mouth at bedtime as needed (agitation). Patient not taking: Reported on 10/10/2024 07/06/24   Rai, Nydia POUR, MD  tamsulosin  (FLOMAX ) 0.4 MG CAPS capsule Take 1 capsule (0.4 mg total) by mouth daily at 12 noon. 08/01/24       Family History Family History  Problem Relation Age of Onset   Mental illness Mother    Cancer Father     Social History Social History[1]   Allergies   Atorvastatin  calcium , Hydrocodone, and Hydrocodone-acetaminophen    Review of Systems Review of Systems  Respiratory:  Positive for cough.      Physical Exam Triage Vital Signs ED Triage Vitals [10/10/24 1945]  Encounter Vitals Group     BP 110/67     Girls Systolic BP Percentile      Girls Diastolic BP Percentile      Boys Systolic BP Percentile      Boys Diastolic BP Percentile      Pulse Rate (!) 106     Resp 12     Temp 98.1 F (36.7 C)     Temp Source Temporal     SpO2 93 %     Weight      Height      Head Circumference      Peak Flow      Pain Score      Pain Loc      Pain Education      Exclude from Growth Chart    No data found.  Updated Vital Signs BP 110/67 (BP Location: Left Arm)   Pulse (!) 106   Temp 98.1 F (36.7 C) (Temporal)   Resp 12   SpO2 93%    Visual Acuity Right Eye Distance:   Left Eye Distance:   Bilateral Distance:    Right Eye Near:   Left Eye Near:    Bilateral Near:     Physical Exam Vitals and nursing note reviewed.  Constitutional:      General: He is not in acute distress.    Appearance: Normal appearance. He is not ill-appearing, toxic-appearing or diaphoretic.  Eyes:  General: No scleral icterus. Cardiovascular:     Rate and Rhythm: Normal rate and regular rhythm.     Heart sounds: Normal heart sounds.  Pulmonary:     Effort: Pulmonary effort is normal. No respiratory distress.     Breath sounds: Normal breath sounds. No wheezing or rhonchi.  Skin:    General: Skin is warm.  Neurological:     Mental Status: He is alert.  Psychiatric:        Mood and Affect: Mood normal.      UC Treatments / Results  Labs (all labs ordered are listed, but only abnormal results are displayed) Labs Reviewed - No data to display  EKG   Radiology DG Chest 2 View Result Date: 10/10/2024 EXAM: 2 VIEW(S) XRAY OF THE CHEST 10/10/2024 08:28:05 PM COMPARISON: None available. CLINICAL HISTORY: Cough and chest congestion for 5 days. FINDINGS: LUNGS AND PLEURA: Lungs are clear. No pleural effusion. No pneumothorax. HEART AND MEDIASTINUM: Heart size and pulmonary vascularity are normal. BONES AND SOFT TISSUES: Degenerative changes in the spine and shoulders. Note diffuse artifact on the image, likely grid cutoff artifact. IMPRESSION: 1. No acute cardiopulmonary abnormality. 2. Image quality is degraded by artifact. Electronically signed by: Elsie Gravely MD 10/10/2024 08:31 PM EST RP Workstation: [id number]    Procedures Procedures (including critical care time)  Medications Ordered in UC Medications - No data to display  Initial Impression / Assessment and Plan / UC Course  I have reviewed the triage vital signs and the nursing notes.  Pertinent labs & imaging results that were available during my care of the  patient were reviewed by me and considered in my medical decision making (see chart for details).     Final Clinical Impressions(s) / UC Diagnoses   Final diagnoses:  Cough, unspecified type     Discharge Instructions      No signs of pneumonia.  I believe his symptoms may be associated with his eating, I believe it is worth speak with speech therapy for re-evaluation    ED Prescriptions   None    PDMP not reviewed this encounter.    [1]  Social History Tobacco Use   Smoking status: Never   Smokeless tobacco: Never  Vaping Use   Vaping status: Never Used  Substance Use Topics   Alcohol use: No   Drug use: Never     Andra Corean BROCKS, PA-C 10/11/24 [id number]  "

## 2024-10-15 ENCOUNTER — Other Ambulatory Visit: Payer: Self-pay

## 2024-10-18 ENCOUNTER — Other Ambulatory Visit: Payer: Self-pay

## 2024-10-19 ENCOUNTER — Other Ambulatory Visit: Payer: Self-pay

## 2024-10-19 ENCOUNTER — Other Ambulatory Visit (HOSPITAL_COMMUNITY): Payer: Self-pay

## 2024-10-19 MED ORDER — ASPIRIN 81 MG PO TBEC
81.0000 mg | DELAYED_RELEASE_TABLET | Freq: Every evening | ORAL | 3 refills | Status: AC
  Filled 2024-10-19: qty 90, 90d supply, fill #0
